# Patient Record
Sex: Female | Born: 1945 | Race: Black or African American | Hispanic: No | Smoking: Never smoker
Health system: Southern US, Community
[De-identification: ages and names within clinical notes are randomized; demographics above are authoritative.]

## PROBLEM LIST (undated history)

## (undated) DIAGNOSIS — E785 Hyperlipidemia, unspecified: Secondary | ICD-10-CM

## (undated) DIAGNOSIS — M1990 Unspecified osteoarthritis, unspecified site: Secondary | ICD-10-CM

## (undated) DIAGNOSIS — I10 Essential (primary) hypertension: Secondary | ICD-10-CM

## (undated) DIAGNOSIS — D649 Anemia, unspecified: Secondary | ICD-10-CM

## (undated) DIAGNOSIS — M62830 Muscle spasm of back: Secondary | ICD-10-CM

## (undated) DIAGNOSIS — E119 Type 2 diabetes mellitus without complications: Secondary | ICD-10-CM

## (undated) DIAGNOSIS — R569 Unspecified convulsions: Secondary | ICD-10-CM

## (undated) DIAGNOSIS — H9190 Unspecified hearing loss, unspecified ear: Secondary | ICD-10-CM

## (undated) DIAGNOSIS — G4489 Other headache syndrome: Secondary | ICD-10-CM

## (undated) DIAGNOSIS — R42 Dizziness and giddiness: Principal | ICD-10-CM

## (undated) DIAGNOSIS — R51 Headache: Secondary | ICD-10-CM

## (undated) DIAGNOSIS — Z8669 Personal history of other diseases of the nervous system and sense organs: Secondary | ICD-10-CM

## (undated) DIAGNOSIS — K219 Gastro-esophageal reflux disease without esophagitis: Secondary | ICD-10-CM

## (undated) DIAGNOSIS — R252 Cramp and spasm: Secondary | ICD-10-CM

## (undated) DIAGNOSIS — R413 Other amnesia: Secondary | ICD-10-CM

## (undated) HISTORY — DX: Essential (primary) hypertension: I10

## (undated) HISTORY — DX: Personal history of other diseases of the nervous system and sense organs: Z86.69

## (undated) HISTORY — DX: Other amnesia: R41.3

## (undated) HISTORY — PX: BACK SURGERY: SHX140

## (undated) HISTORY — DX: Cramp and spasm: R25.2

## (undated) HISTORY — DX: Dizziness and giddiness: R42

## (undated) HISTORY — DX: Other headache syndrome: G44.89

## (undated) HISTORY — DX: Gastro-esophageal reflux disease without esophagitis: K21.9

## (undated) HISTORY — PX: TUBAL LIGATION: SHX77

## (undated) HISTORY — PX: HAND SURGERY: SHX662

## (undated) HISTORY — DX: Headache: R51

## (undated) HISTORY — DX: Hyperlipidemia, unspecified: E78.5

## (undated) HISTORY — PX: ANKLE SURGERY: SHX546

## (undated) HISTORY — DX: Type 2 diabetes mellitus without complications: E11.9

## (undated) HISTORY — PX: KNEE SURGERY: SHX244

## (undated) HISTORY — DX: Muscle spasm of back: M62.830

## (undated) HISTORY — PX: OTHER SURGICAL HISTORY: SHX169

## (undated) HISTORY — DX: Unspecified osteoarthritis, unspecified site: M19.90

---

## 2007-05-20 ENCOUNTER — Encounter: Admission: RE | Admit: 2007-05-20 | Discharge: 2007-05-20 | Payer: Self-pay | Admitting: Family Medicine

## 2007-11-02 ENCOUNTER — Ambulatory Visit: Payer: Self-pay | Admitting: Cardiology

## 2007-11-03 ENCOUNTER — Emergency Department (HOSPITAL_COMMUNITY): Admission: EM | Admit: 2007-11-03 | Discharge: 2007-11-04 | Payer: Self-pay | Admitting: Emergency Medicine

## 2008-02-05 ENCOUNTER — Encounter (HOSPITAL_COMMUNITY): Admission: RE | Admit: 2008-02-05 | Discharge: 2008-03-06 | Payer: Self-pay | Admitting: Family Medicine

## 2008-06-09 ENCOUNTER — Encounter: Admission: RE | Admit: 2008-06-09 | Discharge: 2008-06-09 | Payer: Self-pay | Admitting: Family Medicine

## 2008-08-05 ENCOUNTER — Encounter: Admission: RE | Admit: 2008-08-05 | Discharge: 2008-08-05 | Payer: Self-pay | Admitting: Otolaryngology

## 2009-08-31 ENCOUNTER — Encounter (HOSPITAL_COMMUNITY): Admission: RE | Admit: 2009-08-31 | Discharge: 2009-09-30 | Payer: Self-pay | Admitting: Family Medicine

## 2009-10-04 ENCOUNTER — Encounter (HOSPITAL_COMMUNITY): Admission: RE | Admit: 2009-10-04 | Discharge: 2009-11-03 | Payer: Self-pay | Admitting: Family Medicine

## 2011-04-05 ENCOUNTER — Other Ambulatory Visit (HOSPITAL_COMMUNITY): Payer: Self-pay | Admitting: *Deleted

## 2011-04-05 DIAGNOSIS — Z139 Encounter for screening, unspecified: Secondary | ICD-10-CM

## 2011-04-13 ENCOUNTER — Ambulatory Visit (HOSPITAL_COMMUNITY)
Admission: RE | Admit: 2011-04-13 | Discharge: 2011-04-13 | Disposition: A | Discharge status: Home / Self Care | Payer: Medicaid Other | Source: Ambulatory Visit | Attending: *Deleted | Admitting: *Deleted

## 2011-04-13 DIAGNOSIS — Z1231 Encounter for screening mammogram for malignant neoplasm of breast: Secondary | ICD-10-CM | POA: Insufficient documentation

## 2011-04-13 DIAGNOSIS — Z139 Encounter for screening, unspecified: Secondary | ICD-10-CM

## 2011-05-04 ENCOUNTER — Other Ambulatory Visit (HOSPITAL_COMMUNITY)
Admission: RE | Admit: 2011-05-04 | Discharge: 2011-05-04 | Disposition: A | Discharge status: Home / Self Care | Payer: Medicaid Other | Source: Ambulatory Visit | Attending: Obstetrics & Gynecology | Admitting: Obstetrics & Gynecology

## 2011-05-04 ENCOUNTER — Other Ambulatory Visit: Payer: Self-pay | Admitting: Obstetrics & Gynecology

## 2011-05-04 DIAGNOSIS — Z01419 Encounter for gynecological examination (general) (routine) without abnormal findings: Secondary | ICD-10-CM | POA: Insufficient documentation

## 2011-06-30 LAB — DIFFERENTIAL
Basophils Absolute: 0
Eosinophils Absolute: 0.1
Eosinophils Relative: 1
Lymphs Abs: 0.7
Monocytes Absolute: 0.9
Neutrophils Relative %: 84 — ABNORMAL HIGH

## 2011-06-30 LAB — URINALYSIS, ROUTINE W REFLEX MICROSCOPIC
Bilirubin Urine: NEGATIVE
Glucose, UA: NEGATIVE
Nitrite: NEGATIVE
Protein, ur: NEGATIVE
Urobilinogen, UA: 1

## 2011-06-30 LAB — COMPREHENSIVE METABOLIC PANEL
ALT: 74 — ABNORMAL HIGH
Alkaline Phosphatase: 62
Calcium: 9.2
Chloride: 95 — ABNORMAL LOW
GFR calc non Af Amer: 54 — ABNORMAL LOW
Potassium: 3.8
Sodium: 131 — ABNORMAL LOW
Total Bilirubin: 0.7
Total Protein: 9 — ABNORMAL HIGH

## 2011-06-30 LAB — URINE MICROSCOPIC-ADD ON

## 2011-06-30 LAB — CBC
Platelets: 269
WBC: 11.1 — ABNORMAL HIGH

## 2012-08-15 ENCOUNTER — Other Ambulatory Visit: Payer: Self-pay | Admitting: Neurology

## 2012-08-15 ENCOUNTER — Ambulatory Visit (HOSPITAL_COMMUNITY)
Admission: RE | Admit: 2012-08-15 | Discharge: 2012-08-15 | Disposition: A | Discharge status: Home / Self Care | Payer: Medicaid Other | Source: Ambulatory Visit | Attending: Neurology | Admitting: Neurology

## 2012-08-15 DIAGNOSIS — M545 Low back pain, unspecified: Secondary | ICD-10-CM | POA: Insufficient documentation

## 2012-11-11 ENCOUNTER — Other Ambulatory Visit: Payer: Self-pay | Admitting: Neurology

## 2012-11-11 DIAGNOSIS — R269 Unspecified abnormalities of gait and mobility: Secondary | ICD-10-CM

## 2012-11-22 ENCOUNTER — Ambulatory Visit (HOSPITAL_COMMUNITY): Payer: Medicaid Other

## 2012-11-26 ENCOUNTER — Ambulatory Visit (HOSPITAL_COMMUNITY)
Admission: RE | Admit: 2012-11-26 | Discharge: 2012-11-26 | Disposition: A | Discharge status: Home / Self Care | Payer: Medicaid Other | Source: Ambulatory Visit | Attending: Neurology | Admitting: Neurology

## 2012-11-26 DIAGNOSIS — R42 Dizziness and giddiness: Secondary | ICD-10-CM | POA: Insufficient documentation

## 2012-11-26 DIAGNOSIS — R51 Headache: Secondary | ICD-10-CM | POA: Insufficient documentation

## 2012-11-26 DIAGNOSIS — R269 Unspecified abnormalities of gait and mobility: Secondary | ICD-10-CM | POA: Insufficient documentation

## 2012-11-26 DIAGNOSIS — R9389 Abnormal findings on diagnostic imaging of other specified body structures: Secondary | ICD-10-CM | POA: Insufficient documentation

## 2013-02-12 ENCOUNTER — Encounter: Payer: Medicaid Other | Admitting: *Deleted

## 2013-03-07 ENCOUNTER — Encounter: Payer: Self-pay | Admitting: Neurology

## 2013-03-07 ENCOUNTER — Ambulatory Visit (INDEPENDENT_AMBULATORY_CARE_PROVIDER_SITE_OTHER): Payer: Medicaid Other | Admitting: Neurology

## 2013-03-07 VITALS — BP 126/68 | HR 68 | Ht 66.75 in | Wt 192.0 lb

## 2013-03-07 DIAGNOSIS — R42 Dizziness and giddiness: Secondary | ICD-10-CM

## 2013-03-07 DIAGNOSIS — R51 Headache: Secondary | ICD-10-CM

## 2013-03-07 HISTORY — DX: Headache: R51

## 2013-03-07 HISTORY — DX: Dizziness and giddiness: R42

## 2013-03-07 MED ORDER — TOPIRAMATE 25 MG PO TABS: 25.0000 mg | ORAL_TABLET | Freq: Every day | ORAL | Status: DC

## 2013-03-07 NOTE — Progress Notes (Signed)
Reason for visit: Vertigo  Beth Meza is a 67 y.o. female  History of present illness:  Beth Meza is a 67 year old right-handed black female with a history of headaches dating back about 10 years. The patient is a poor historian, and it is difficult to get an accurate history of what is going on with her. The patient indicates that 4 years ago, she began having episodes of headache associated with vertigo. The patient indicates that she will get a bifrontal headache and burning sensations in the temporal regions. The patient will get vertigo that occurs with these events. The vertigo may be associated with nausea, no vomiting. The patient indicates that when she is quiet, and rests, the episode will go away in about one half hour, and she may feel wiped out afterwards. The patient denies any focal numbness or weakness with the events. The patient denies loss of vision or double vision. The patient also indicates that when she lies down at night, she will get the same headache and vertigo once again. This is brought on by lying flat. The patient indicates that she has to elevate her head in order to rest well. The patient feels somewhat staggery with the vertigo. The patient has had an MRI the brain that shows very minimal white matter changes. The patient has a history of hypertension and dyslipidemia. The patient comes to this office for an evaluation.  Past Medical History  Diagnosis Date  . Back spasm   . Hand cramps   . Leg cramps   . Dizziness and giddiness 03/07/2013  . Headache(784.0) 03/07/2013  . Gastroesophageal reflux disease   . Degenerative arthritis   . Hypertension   . Dyslipidemia   . History of carpal tunnel syndrome     Bilateral    Past Surgical History  Procedure Laterality Date  . Back surgery    . Colon biopsy    . Knee surgery      Pin, history of fracture  . Ankle surgery      History of ankle fracture  . Hand surgery      Carpal tunnel syndrome,  trigger fingers bilaterally    Family History  Problem Relation Age of Onset  . Heart attack Father     Social history:  reports that she has never smoked. She does not have any smokeless tobacco history on file. She reports that she does not drink alcohol or use illicit drugs.  Medications:  No current outpatient prescriptions on file prior to visit.   No current facility-administered medications on file prior to visit.    Allergies: No Known Allergies  ROS:  Out of a complete 14 system review of symptoms, the patient complains only of the following symptoms, and all other reviewed systems are negative.  Weight gain, fatigue Chest pain, palpitations, swelling in the legs Hearing loss, ringing in the ears, stinging sensations Itching, moles Blurred vision, eye pain Shortness of breath Urinary incontinence, constipation Easy bruising Feeling hot, cold, increased thirst, flushing Joint swelling, leg cramps, achy muscles Memory loss, confusion, headache, numbness, weakness, slurred speech, dizziness Depression, anxiety, decreased energy, change in appetite, sleepiness  Blood pressure 126/68, pulse 68, height 5' 6.75" (1.695 m), weight 192 lb (87.091 kg).  Physical Exam  General: The patient is alert and cooperative at the time of the examination. The patient is minimally obese.  Head: Pupils are equal, round, and reactive to light. Discs are flat bilaterally.  Neck: The neck is supple, no carotid  bruits are noted.  Respiratory: The respiratory examination is clear.  Cardiovascular: The cardiovascular examination reveals a regular rate and rhythm, no obvious murmurs or rubs are noted.  Skin: Extremities are without significant edema.  Neurologic Exam  Mental status:  Cranial nerves: Facial symmetry is present. There is good sensation of the face to pinprick and soft touch bilaterally. The strength of the facial muscles and the muscles to head turning and shoulder  shrug are normal bilaterally. Speech is well enunciated, no aphasia or dysarthria is noted. Extraocular movements are full. Visual fields are full.  Motor: The motor testing reveals 5 over 5 strength of all 4 extremities. Good symmetric motor tone is noted throughout.  Sensory: Sensory testing is intact to pinprick, soft touch, vibration sensation, and position sense on all 4 extremities. No evidence of extinction is noted.  Coordination: Cerebellar testing reveals good finger-nose-finger and heel-to-shin bilaterally.  Gait and station: Gait is slightly wide-based. Tandem gait is slightly unsteady. Romberg is negative. No drift is seen.  Reflexes: Deep tendon reflexes are symmetric and normal bilaterally. Toes are downgoing bilaterally.   Assessment/Plan:  1. History of vertigo and headache  2. Minimally abnormal MRI brain  The patient did have very minimal white matter changes, left greater than right deep white matter. The patient has a history of hypertension, currently on medication. The changes seen on MRI are likely secondary to minimal small vessel disease. The patient gives a history of headache and vertigo, which is atypical for true inner ear disease. The patient will be treated with Topamax, and the meclizine will be stopped. The headache and vertigo could potentially represent migraine. These events are quite chronic, going on for greater than 4 years. The patient will followup in 4 months.  Marlan Palau MD 03/08/2013 7:32 PM  [facility] [address] [place], [place] [address]  Phone [phone] Fax [phone]

## 2013-04-15 ENCOUNTER — Other Ambulatory Visit (HOSPITAL_COMMUNITY): Payer: Self-pay | Admitting: Family Medicine

## 2013-04-15 DIAGNOSIS — Z139 Encounter for screening, unspecified: Secondary | ICD-10-CM

## 2013-04-18 ENCOUNTER — Ambulatory Visit (HOSPITAL_COMMUNITY)
Admission: RE | Admit: 2013-04-18 | Discharge: 2013-04-18 | Disposition: A | Discharge status: Home / Self Care | Payer: Medicare Other | Source: Ambulatory Visit | Attending: Family Medicine | Admitting: Family Medicine

## 2013-04-18 DIAGNOSIS — Z1231 Encounter for screening mammogram for malignant neoplasm of breast: Secondary | ICD-10-CM | POA: Insufficient documentation

## 2013-04-18 DIAGNOSIS — Z139 Encounter for screening, unspecified: Secondary | ICD-10-CM

## 2013-05-15 ENCOUNTER — Emergency Department (HOSPITAL_COMMUNITY): Payer: Medicare Other

## 2013-05-15 ENCOUNTER — Encounter (HOSPITAL_COMMUNITY): Payer: Self-pay

## 2013-05-15 ENCOUNTER — Emergency Department (HOSPITAL_COMMUNITY)
Admission: EM | Admit: 2013-05-15 | Discharge: 2013-05-15 | Disposition: A | Discharge status: Home / Self Care | Payer: Medicare Other | Attending: Emergency Medicine | Admitting: Emergency Medicine

## 2013-05-15 DIAGNOSIS — Z79899 Other long term (current) drug therapy: Secondary | ICD-10-CM | POA: Insufficient documentation

## 2013-05-15 DIAGNOSIS — Y929 Unspecified place or not applicable: Secondary | ICD-10-CM | POA: Insufficient documentation

## 2013-05-15 DIAGNOSIS — Z8719 Personal history of other diseases of the digestive system: Secondary | ICD-10-CM | POA: Insufficient documentation

## 2013-05-15 DIAGNOSIS — S060X0A Concussion without loss of consciousness, initial encounter: Secondary | ICD-10-CM

## 2013-05-15 DIAGNOSIS — R252 Cramp and spasm: Secondary | ICD-10-CM | POA: Insufficient documentation

## 2013-05-15 DIAGNOSIS — M5380 Other specified dorsopathies, site unspecified: Secondary | ICD-10-CM | POA: Insufficient documentation

## 2013-05-15 DIAGNOSIS — S060X9A Concussion with loss of consciousness of unspecified duration, initial encounter: Secondary | ICD-10-CM | POA: Insufficient documentation

## 2013-05-15 DIAGNOSIS — S060XAA Concussion with loss of consciousness status unknown, initial encounter: Secondary | ICD-10-CM | POA: Insufficient documentation

## 2013-05-15 DIAGNOSIS — Z8669 Personal history of other diseases of the nervous system and sense organs: Secondary | ICD-10-CM | POA: Insufficient documentation

## 2013-05-15 DIAGNOSIS — S0190XA Unspecified open wound of unspecified part of head, initial encounter: Secondary | ICD-10-CM | POA: Insufficient documentation

## 2013-05-15 DIAGNOSIS — E785 Hyperlipidemia, unspecified: Secondary | ICD-10-CM | POA: Insufficient documentation

## 2013-05-15 DIAGNOSIS — I10 Essential (primary) hypertension: Secondary | ICD-10-CM | POA: Insufficient documentation

## 2013-05-15 DIAGNOSIS — H538 Other visual disturbances: Secondary | ICD-10-CM | POA: Insufficient documentation

## 2013-05-15 DIAGNOSIS — W208XXA Other cause of strike by thrown, projected or falling object, initial encounter: Secondary | ICD-10-CM | POA: Insufficient documentation

## 2013-05-15 DIAGNOSIS — M1990 Unspecified osteoarthritis, unspecified site: Secondary | ICD-10-CM | POA: Insufficient documentation

## 2013-05-15 DIAGNOSIS — Y9389 Activity, other specified: Secondary | ICD-10-CM | POA: Insufficient documentation

## 2013-05-15 MED ORDER — IBUPROFEN 800 MG PO TABS
800.0000 mg | ORAL_TABLET | Freq: Once | ORAL | Status: AC
  Administered 2013-05-15: 800 mg via ORAL

## 2013-05-15 MED ORDER — IBUPROFEN 800 MG PO TABS
ORAL_TABLET | ORAL | Status: AC
  Administered 2013-05-15: 800 mg via ORAL
  Filled 2013-05-15: qty 1

## 2013-05-15 NOTE — ED Notes (Signed)
Pt states a vase fell on the top of her head. Denies LOC. Has small puncture to top of head

## 2013-05-15 NOTE — ED Provider Notes (Signed)
CSN: [id number]     Arrival date & time 05/15/13  1243 History     First MD Initiated Contact with Patient 05/15/13 1257     Chief Complaint  Patient presents with  . Head Injury   (Consider location/radiation/quality/duration/timing/severity/associated sxs/prior Treatment) HPI Comments: Patient was cleaning off a shelf when a vase fell and landed on the top of her head.  She denies loc, but reports head pain since that time and feels as if her vision is blurry.  She received a small laceration to the top of her head.  Patient is a 67 y.o. female presenting with head injury. The history is provided by the patient.  Head Injury Head/neck injury location: top of head. Time since incident:  3 hours Mechanism of injury: direct blow   Pain details:    Quality:  Pressure   Severity:  Moderate   Timing:  Constant   Progression:  Unchanged Chronicity:  New Relieved by:  Nothing Worsened by:  Nothing tried Ineffective treatments:  None tried   Past Medical History  Diagnosis Date  . Back spasm   . Hand cramps   . Leg cramps   . Dizziness and giddiness 03/07/2013  . Headache(784.0) 03/07/2013  . Gastroesophageal reflux disease   . Degenerative arthritis   . Hypertension   . Dyslipidemia   . History of carpal tunnel syndrome     Bilateral   Past Surgical History  Procedure Laterality Date  . Back surgery    . Colon biopsy    . Knee surgery      Pin, history of fracture  . Ankle surgery      History of ankle fracture  . Hand surgery      Carpal tunnel syndrome, trigger fingers bilaterally   Family History  Problem Relation Age of Onset  . Heart attack Father    History  Substance Use Topics  . Smoking status: Never Smoker   . Smokeless tobacco: Not on file  . Alcohol Use: No   OB History   Grav Para Term Preterm Abortions TAB SAB Ect Mult Living                 Review of Systems  All other systems reviewed and are negative.    Allergies  Review of patient's  allergies indicates no known allergies.  Home Medications   Current Outpatient Rx  Name  Route  Sig  Dispense  Refill  . amLODipine (NORVASC) 5 MG tablet   Oral   Take 5 mg by mouth daily.         . diclofenac sodium (VOLTAREN) 1 % GEL   Topical   Apply 1 application topically daily.         Marland Kitchen docusate sodium (COLACE) 50 MG capsule   Oral   Take by mouth 2 (two) times daily.         . fish oil-omega-3 fatty acids 1000 MG capsule   Oral   Take 2 g by mouth daily.         Marland Kitchen lisinopril-hydrochlorothiazide (PRINZIDE,ZESTORETIC) 20-25 MG per tablet   Oral   Take 1 tablet by mouth daily.         . meclizine (ANTIVERT) 25 MG tablet   Oral   Take 25 mg by mouth 3 (three) times daily as needed.         . Multiple Vitamin (MULTIVITAMIN) tablet   Oral   Take 1 tablet by mouth daily.         Marland Kitchen  naproxen sodium (ANAPROX) 220 MG tablet   Oral   Take 220 mg by mouth 2 (two) times daily with a meal.         . nystatin (MYCOSTATIN) 100000 UNIT/ML suspension   Oral   Take 500,000 Units by mouth 2 (two) times daily as needed.         . pravastatin (PRAVACHOL) 20 MG tablet   Oral   Take 20 mg by mouth daily.         . ranitidine (ZANTAC) 150 MG capsule   Oral   Take 150 mg by mouth 2 (two) times daily.         Marland Kitchen topiramate (TOPAMAX) 25 MG tablet   Oral   Take 1 tablet (25 mg total) by mouth at bedtime.   30 tablet   2    BP 120/70  Pulse 69  Temp(Src) 98.7 F (37.1 C) (Oral)  Resp 18  Ht 5\' 8"  (1.727 m)  Wt 189 lb (85.73 kg)  BMI 28.74 kg/m2  SpO2 100% Physical Exam  Nursing note and vitals reviewed. Constitutional: She is oriented to person, place, and time. She appears well-developed and well-nourished. No distress.  HENT:  Head: Normocephalic and atraumatic.  Mouth/Throat: Oropharynx is clear and moist.  There is a small 1 cm laceration to the top of the head.  There is no active bleeding.    There is no hemotympanum.  Eyes: EOM are  normal. Pupils are equal, round, and reactive to light.  Neck: Normal range of motion. Neck supple.  Cardiovascular: Normal rate and regular rhythm.  Exam reveals no gallop and no friction rub.   No murmur heard. Pulmonary/Chest: Effort normal and breath sounds normal. No respiratory distress. She has no wheezes.  Abdominal: Soft. Bowel sounds are normal. She exhibits no distension. There is no tenderness.  Musculoskeletal: Normal range of motion.  Neurological: She is alert and oriented to person, place, and time. No cranial nerve deficit. She exhibits normal muscle tone. Coordination normal.  Skin: Skin is warm and dry. She is not diaphoretic.    ED Course   Procedures (including critical care time)  Labs Reviewed - No data to display No results found. No diagnosis found.  MDM  The neurologic exam is nonfocal and the CT scan is unremarkable. Her headache was treated with Motrin and she is feeling better. At this point she appears stable for discharge. She is to re turn as needed should her symptoms worsen or change.  Geoffery Lyons, MD 05/15/13 1416

## 2013-06-03 ENCOUNTER — Other Ambulatory Visit: Payer: Self-pay | Admitting: Neurology

## 2013-07-08 ENCOUNTER — Ambulatory Visit (INDEPENDENT_AMBULATORY_CARE_PROVIDER_SITE_OTHER): Payer: Medicare Other | Admitting: Nurse Practitioner

## 2013-07-08 ENCOUNTER — Encounter: Payer: Self-pay | Admitting: Nurse Practitioner

## 2013-07-08 VITALS — BP 120/80 | HR 76 | Ht 67.0 in | Wt 188.0 lb

## 2013-07-08 DIAGNOSIS — R51 Headache: Secondary | ICD-10-CM

## 2013-07-08 DIAGNOSIS — R42 Dizziness and giddiness: Secondary | ICD-10-CM

## 2013-07-08 MED ORDER — TOPIRAMATE 25 MG PO TABS: 25.0000 mg | ORAL_TABLET | Freq: Every day | ORAL | Status: DC

## 2013-07-08 NOTE — Patient Instructions (Addendum)
Continue Topamax at current dose Will renew F/U in 6 to 8 months

## 2013-07-08 NOTE — Progress Notes (Signed)
[facility]  PATIENT: Beth Meza DOB: 1946-01-02   REASON FOR VISIT: Followup for headaches   HISTORY OF PRESENT ILLNESS: Ms Angst, 67 yr old black female returns for follow up. She has a history of headaches dating back about 10 years. She was initially evaluated by Dr. Anne Hahn 03/07/2013 . The patient is a poor historian, and it is difficult to get an accurate history of what is going on with her. The patient indicates that 4 years ago, she began having episodes of headache associated with vertigo. The patient indicates that she will get a bifrontal headache and burning sensations in the temporal regions. The patient will get vertigo that occurs with these events. The vertigo may be associated with nausea, no vomiting. The patient indicates that when she is quiet, and rests, the episode will go away in about one half hour, and she may feel wiped out afterwards. The patient denies any focal numbness or weakness with the events. The patient denies loss of vision or double vision. The patient also indicates that when she lies down at night, she will get the same headache and vertigo once again. This is brought on by lying flat. The patient indicates that she has to elevate her head in order to rest well. The patient feels somewhat staggery with the vertigo. The patient has had an MRI the brain that shows very minimal white matter changes. The patient has a history of hypertension and dyslipidemia. She was placed on Topamax low-dose at her last visit and reports that her headaches are doing much better. She is also pleased that she has lost some weight. She had injection in the lumbar area by Dr. Gerilyn Pilgrim 2 weeks ago for her history of chronic back pain.     REVIEW OF SYSTEMS: Full 14 system review of systems performed and notable only for:  Constitutional: N/A  Cardiovascular: N/A  Ear/Nose/Throat: N/A  Skin: Easy bruising  Eyes: N/A  Respiratory: Shortness of breath    Gastroitestinal: N/A  Hematology/Lymphatic: N/A  Endocrine: N/A Musculoskeletal: Back pain  Allergy/Immunology: N/A  Neurological: N/A Psychiatric: Depression and decreased energy  ALLERGIES: No Known Allergies  HOME MEDICATIONS: Outpatient Prescriptions Prior to Visit  Medication Sig Dispense Refill  . amLODipine (NORVASC) 5 MG tablet Take 5 mg by mouth daily.      . diclofenac sodium (VOLTAREN) 1 % GEL Apply 1 application topically daily as needed (Pain).       Marland Kitchen diphenhydrAMINE (BENADRYL) 25 MG tablet Take 25 mg by mouth every 6 (six) hours as needed for allergies.      Marland Kitchen docusate sodium (COLACE) 100 MG capsule Take 100-200 mg by mouth 2 (two) times daily.      . ferrous sulfate 325 (65 FE) MG tablet Take 325 mg by mouth daily with breakfast.      . fish oil-omega-3 fatty acids 1000 MG capsule Take 1 g by mouth 2 (two) times daily.       Marland Kitchen ibuprofen (ADVIL,MOTRIN) 200 MG tablet Take 200 mg by mouth every 6 (six) hours as needed for pain.      Marland Kitchen lisinopril-hydrochlorothiazide (PRINZIDE,ZESTORETIC) 20-25 MG per tablet Take 1 tablet by mouth daily.      . Multiple Vitamin (MULTIVITAMIN) tablet Take 1 tablet by mouth daily.      Marland Kitchen nystatin (MYCOSTATIN) 100000 UNIT/ML suspension Take 500,000 Units by mouth 2 (two) times daily as needed.      . pravastatin (PRAVACHOL) 20 MG tablet Take 20 mg  by mouth daily.      . predniSONE (DELTASONE) 10 MG tablet Take 10 mg by mouth as directed. Takes 10 mg twice daily for 7 days, then takes 10 mg daily for 7 days .      . ranitidine (ZANTAC) 150 MG capsule Take 150 mg by mouth 2 (two) times daily.      . sodium chloride (OCEAN) 0.65 % nasal spray Place 1 spray into the nose as needed for congestion.      . topiramate (TOPAMAX) 25 MG tablet TAKE 1 TABLET (25 MG TOTAL) BY MOUTH AT BEDTIME.  30 tablet  2  . vitamin C (ASCORBIC ACID) 500 MG tablet Take 500 mg by mouth daily.       No facility-administered medications prior to visit.    PAST MEDICAL  HISTORY: Past Medical History  Diagnosis Date  . Back spasm   . Hand cramps   . Leg cramps   . Dizziness and giddiness 03/07/2013  . Headache(784.0) 03/07/2013  . Gastroesophageal reflux disease   . Degenerative arthritis   . Hypertension   . Dyslipidemia   . History of carpal tunnel syndrome     Bilateral    PAST SURGICAL HISTORY: Past Surgical History  Procedure Laterality Date  . Back surgery    . Colon biopsy    . Knee surgery      Pin, history of fracture  . Ankle surgery      History of ankle fracture  . Hand surgery      Carpal tunnel syndrome, trigger fingers bilaterally    FAMILY HISTORY: Family History  Problem Relation Age of Onset  . Heart attack Father     SOCIAL HISTORY: History   Social History  . Marital Status: Divorced    Spouse Name: N/A    Number of Children: 3  . Years of Education: 14   Occupational History  . Not on file.   Social History Main Topics  . Smoking status: Never Smoker   . Smokeless tobacco: Never Used  . Alcohol Use: No  . Drug Use: No  . Sexual Activity: Not on file   Other Topics Concern  . Not on file   Social History Narrative  . No narrative on file     PHYSICAL EXAM  Filed Vitals:   07/08/13 1007 07/08/13 1009  BP: 106/62 99/68  Pulse: 65 76  Height: 5\' 7"  (1.702 m)   Weight: 188 lb (85.276 kg)    Body mass index is 29.44 kg/(m^2).  Generalized: Well developed, mildly obese in no acute distress  Musculoskeletal: No deformity   Neurological examination   Mentation: Alert oriented to time, place, history taking. Follows all commands speech and language fluent  Cranial nerve II-XII: .Pupils were equal round reactive to light extraocular movements were full, visual field were full on confrontational test. Facial sensation and strength were normal. hearing was intact to finger rubbing bilaterally. Uvula tongue midline. head turning and shoulder shrug and were normal and symmetric.Tongue protrusion into  cheek strength was normal. Motor: normal bulk and tone, full strength in the BUE, BLE, fine finger movements normal, no pronator drift. No focal weakness Coordination: finger-nose-finger, heel-to-shin bilaterally, no dysmetria Reflexes: Brachioradialis 2/2, biceps 2/2, triceps 2/2, patellar 2/2, Achilles 2/2, plantar responses were flexor bilaterally. Gait and Station: Rising up from seated position without assistance, wide based stance,  moderate stride, good arm swing, smooth turning, able to perform tiptoe, and heel walking without difficulty. Tandem gait is steady  DIAGNOSTIC DATA (LABS, IMAGING, TESTING) None to review    ASSESSMENT AND PLAN  67 y.o. year old female  has a past medical history of headaches and  Dizziness here for follow up visit.  Headaches much improved on Topamax.   Continue Topamax at current dose Will renew F/U in 6 to 8 months  Nilda Riggs, [facility], [facility], APRN  [facility] [address], [address] [place], [place] [address] [phone]

## 2013-07-08 NOTE — Progress Notes (Signed)
I have read the note, and I agree with the clinical assessment and plan.  , KEITH   

## 2013-08-25 ENCOUNTER — Telehealth: Payer: Self-pay | Admitting: Neurology

## 2013-08-25 MED ORDER — PREDNISONE 5 MG PO TABS: ORAL_TABLET | ORAL | Status: DC

## 2013-08-25 NOTE — Telephone Encounter (Signed)
I called patient. The patient has had dizziness for 2 days associated with some soreness at the top of the head, going down into the neck. The patient is felt potentially have migraine. I will call in some prednisone, and the patient will be seen in 2 days for now.

## 2013-08-25 NOTE — Telephone Encounter (Signed)
Patient said that vertigo may be coming back, started on Friday, sore head all over, could hardly turn, some better today but wanting to f/u with Dr Anne Hahn. Patient sched

## 2013-08-27 ENCOUNTER — Ambulatory Visit (INDEPENDENT_AMBULATORY_CARE_PROVIDER_SITE_OTHER): Payer: Medicare Other | Admitting: Neurology

## 2013-08-27 ENCOUNTER — Encounter: Payer: Self-pay | Admitting: Neurology

## 2013-08-27 VITALS — BP 148/88 | HR 89 | Wt 189.0 lb

## 2013-08-27 DIAGNOSIS — R42 Dizziness and giddiness: Secondary | ICD-10-CM

## 2013-08-27 DIAGNOSIS — R51 Headache: Secondary | ICD-10-CM

## 2013-08-27 NOTE — Patient Instructions (Signed)

## 2013-08-27 NOTE — Progress Notes (Signed)
Reason for visit: Headache  Beth Meza is an 67 y.o. female  History of present illness:  Beth Meza is a 67 year old right-handed  black female with a history of what is felt to be migraine headache. The patient has episodes of dizziness and vertigo with the headache. The patient was placed on low-dose Topamax, and she has done very well with this taking 25 mg at night. The patient however, began having a recurrence of symptoms around 08/23/2013. The patient has had onset of dizziness, vertigo, scalp tenderness on the left occipital area, with discomfort going down into the shoulders bilaterally, with some discomfort down both arms, and some pressure sensation on the upper chest on the left. The patient indicates that she is doing much better at this point when she was placed on a prednisone Dosepak. The patient still has some soreness of the scalp, and some neck discomfort, but the vertigo has improved, and the headache is much better. The patient returns to the office today for an evaluation.  Past Medical History  Diagnosis Date  . Back spasm   . Hand cramps   . Leg cramps   . Dizziness and giddiness 03/07/2013  . Headache(784.0) 03/07/2013  . Gastroesophageal reflux disease   . Degenerative arthritis   . Hypertension   . Dyslipidemia   . History of carpal tunnel syndrome     Bilateral    Past Surgical History  Procedure Laterality Date  . Back surgery    . Colon biopsy    . Knee surgery      Pin, history of fracture  . Ankle surgery      History of ankle fracture  . Hand surgery      Carpal tunnel syndrome, trigger fingers bilaterally    Family History  Problem Relation Age of Onset  . Heart attack Father     Social history:  reports that she has never smoked. She has never used smokeless tobacco. She reports that she does not drink alcohol or use illicit drugs.   No Known Allergies  Medications:  Current Outpatient Prescriptions on File Prior to Visit    Medication Sig Dispense Refill  . amLODipine (NORVASC) 5 MG tablet Take 5 mg by mouth daily.      . diclofenac sodium (VOLTAREN) 1 % GEL Apply 1 application topically daily as needed (Pain).       Marland Kitchen diphenhydrAMINE (BENADRYL) 25 MG tablet Take 25 mg by mouth every 6 (six) hours as needed for allergies.      . ferrous sulfate 325 (65 FE) MG tablet Take 325 mg by mouth daily with breakfast.      . fish oil-omega-3 fatty acids 1000 MG capsule Take 1 g by mouth 2 (two) times daily.       Marland Kitchen ibuprofen (ADVIL,MOTRIN) 200 MG tablet Take 200 mg by mouth every 6 (six) hours as needed for pain.      Marland Kitchen lisinopril-hydrochlorothiazide (PRINZIDE,ZESTORETIC) 20-25 MG per tablet Take 1 tablet by mouth daily.      . Multiple Vitamin (MULTIVITAMIN) tablet Take 1 tablet by mouth daily.      Marland Kitchen nystatin (MYCOSTATIN) 100000 UNIT/ML suspension Take 500,000 Units by mouth 2 (two) times daily as needed.      . pravastatin (PRAVACHOL) 20 MG tablet Take 20 mg by mouth daily.      . predniSONE (DELTASONE) 5 MG tablet Began taking 6 tablets daily, taper by one tablet daily until off the medication.  21 tablet  0  . ranitidine (ZANTAC) 150 MG capsule Take 150 mg by mouth 2 (two) times daily.      . sodium chloride (OCEAN) 0.65 % nasal spray Place 1 spray into the nose as needed for congestion.      . topiramate (TOPAMAX) 25 MG tablet Take 1 tablet (25 mg total) by mouth daily.  30 tablet  6  . vitamin C (ASCORBIC ACID) 500 MG tablet Take 500 mg by mouth daily.       No current facility-administered medications on file prior to visit.    ROS:  Out of a complete 14 system review of symptoms, the patient complains only of the following symptoms, and all other reviewed systems are negative.  Fatigue Hearing loss, ringing in the ears, dizziness Blurred vision, eye pain Shortness of breath Easy bruising Joint pain, joint swelling, aching muscles Memory loss, headache, numbness, weakness, slurred speech,  dizziness Decreased energy  Blood pressure 148/88, pulse 89, weight 189 lb (85.73 kg).  Physical Exam  General: The patient is alert and cooperative at the time of the examination.  Neuromuscular: Range of movement of the cervical spine is full and normal.  Skin: No significant peripheral edema is noted.   Neurologic Exam  Mental status: The patient is oriented x 3.  Cranial nerves: Facial symmetry is present. Speech is normal, no aphasia or dysarthria is noted. Extraocular movements are full. Visual fields are full. Pupils are equal, round, and reactive to light. Discs are flat bilaterally.  Motor: The patient has good strength in all 4 extremities.  Sensory examination: Soft touch sensation is symmetric on the face, arms, and legs.  Coordination: The patient has good finger-nose-finger and heel-to-shin bilaterally.  Gait and station: The patient has a normal gait, but the patient uses a cane for ambulation. Tandem gait is normal. Romberg is negative. No drift is seen.  Reflexes: Deep tendon reflexes are symmetric.   Assessment/Plan:  One. Migraine headache associated with vertigo  The patient had been doing quite well on the Topamax in very low dose taking 25 mg at night. The patient has had a recurrence of her symptoms with the headache, and the vertigo that has responded to a prednisone Dosepak. The patient will continue the prednisone Dosepak, but if the symptoms recur, she is to contact our office. We may need to go up on the Topamax dose in the future. The patient otherwise will followup through this office in 6 months. MRI evaluation done in February 2014 showed chronic small vessel disease.  Marlan Palau MD 08/27/2013 8:57 PM  [facility] [address] [place], [place] [address]  Phone [phone] Fax [phone]

## 2013-09-01 ENCOUNTER — Telehealth: Payer: Self-pay | Admitting: Neurology

## 2013-09-01 MED ORDER — TOPIRAMATE 25 MG PO TABS: 50.0000 mg | ORAL_TABLET | Freq: Every day | ORAL | Status: DC

## 2013-09-01 NOTE — Telephone Encounter (Signed)
Please advise 

## 2013-09-01 NOTE — Telephone Encounter (Signed)
I called patient. The patient indicates that her headaches are coming back at this point. She is on Topamax taking 25 mg at night, will go to 50 mg at night. A prescription was called in.

## 2013-12-31 ENCOUNTER — Ambulatory Visit (HOSPITAL_COMMUNITY)
Admission: RE | Admit: 2013-12-31 | Discharge: 2013-12-31 | Disposition: A | Discharge status: Home / Self Care | Payer: Medicare Other | Source: Ambulatory Visit | Attending: Family Medicine | Admitting: Family Medicine

## 2013-12-31 ENCOUNTER — Other Ambulatory Visit (HOSPITAL_COMMUNITY): Payer: Self-pay | Admitting: Family Medicine

## 2013-12-31 DIAGNOSIS — M259 Joint disorder, unspecified: Secondary | ICD-10-CM | POA: Insufficient documentation

## 2013-12-31 DIAGNOSIS — M25512 Pain in left shoulder: Secondary | ICD-10-CM

## 2013-12-31 DIAGNOSIS — M25519 Pain in unspecified shoulder: Secondary | ICD-10-CM | POA: Insufficient documentation

## 2014-01-14 ENCOUNTER — Other Ambulatory Visit (HOSPITAL_COMMUNITY): Payer: Self-pay | Admitting: Family Medicine

## 2014-01-14 DIAGNOSIS — M25512 Pain in left shoulder: Secondary | ICD-10-CM

## 2014-01-20 ENCOUNTER — Ambulatory Visit (HOSPITAL_COMMUNITY)
Admission: RE | Admit: 2014-01-20 | Discharge: 2014-01-20 | Disposition: A | Discharge status: Home / Self Care | Payer: Medicare Other | Source: Ambulatory Visit | Attending: Family Medicine | Admitting: Family Medicine

## 2014-01-20 DIAGNOSIS — M25619 Stiffness of unspecified shoulder, not elsewhere classified: Secondary | ICD-10-CM | POA: Insufficient documentation

## 2014-01-20 DIAGNOSIS — S43499A Other sprain of unspecified shoulder joint, initial encounter: Secondary | ICD-10-CM | POA: Insufficient documentation

## 2014-01-20 DIAGNOSIS — X58XXXA Exposure to other specified factors, initial encounter: Secondary | ICD-10-CM | POA: Insufficient documentation

## 2014-01-20 DIAGNOSIS — M25512 Pain in left shoulder: Secondary | ICD-10-CM

## 2014-01-20 DIAGNOSIS — M259 Joint disorder, unspecified: Secondary | ICD-10-CM | POA: Insufficient documentation

## 2014-01-20 DIAGNOSIS — M25519 Pain in unspecified shoulder: Secondary | ICD-10-CM | POA: Insufficient documentation

## 2014-01-20 DIAGNOSIS — S46819A Strain of other muscles, fascia and tendons at shoulder and upper arm level, unspecified arm, initial encounter: Secondary | ICD-10-CM | POA: Insufficient documentation

## 2014-02-11 ENCOUNTER — Other Ambulatory Visit: Payer: Self-pay

## 2014-02-11 MED ORDER — TOPIRAMATE 25 MG PO TABS: 50.0000 mg | ORAL_TABLET | Freq: Every day | ORAL | Status: DC

## 2014-02-17 ENCOUNTER — Ambulatory Visit: Payer: Medicare Other | Admitting: Nurse Practitioner

## 2014-02-25 ENCOUNTER — Encounter (INDEPENDENT_AMBULATORY_CARE_PROVIDER_SITE_OTHER): Payer: Self-pay

## 2014-02-25 ENCOUNTER — Encounter: Payer: Self-pay | Admitting: Neurology

## 2014-02-25 ENCOUNTER — Ambulatory Visit (INDEPENDENT_AMBULATORY_CARE_PROVIDER_SITE_OTHER): Payer: Medicare Other | Admitting: Neurology

## 2014-02-25 VITALS — BP 120/74 | HR 92 | Wt 180.0 lb

## 2014-02-25 DIAGNOSIS — R51 Headache: Secondary | ICD-10-CM

## 2014-02-25 DIAGNOSIS — R42 Dizziness and giddiness: Secondary | ICD-10-CM

## 2014-02-25 DIAGNOSIS — R413 Other amnesia: Secondary | ICD-10-CM

## 2014-02-25 HISTORY — DX: Other amnesia: R41.3

## 2014-02-25 MED ORDER — TOPIRAMATE 25 MG PO TABS: 50.0000 mg | ORAL_TABLET | Freq: Every day | ORAL | Status: DC

## 2014-02-25 NOTE — Progress Notes (Signed)
Reason for visit: Migraine headache  Beth Meza is an 68 y.o. female  History of present illness:  Beth Meza is a 68 year old right-handed black female with a history of migraine headaches. The patient had an exacerbation of her headaches in November 2014, and the Topamax dosing was increased from 25 to 50 mg at night. Since that time, she has done quite well with her headaches, and she indicates that the headaches are quite rare at this point. She does have some chronic low back pain, and she is being evaluated for a left rotator tear cuff tear, and she may require surgery in the future. She has been told she has early diabetes, and she has been placed on a low carbohydrate diet and she has lost weight with exercise and dieting. The patient returns to this office for an evaluation.  Past Medical History  Diagnosis Date  . Back spasm   . Hand cramps   . Leg cramps   . Dizziness and giddiness 03/07/2013  . Headache(784.0) 03/07/2013  . Gastroesophageal reflux disease   . Degenerative arthritis   . Hypertension   . Dyslipidemia   . History of carpal tunnel syndrome     Bilateral  . Diabetes     diet controlled  . Memory deficit 02/25/2014    Past Surgical History  Procedure Laterality Date  . Back surgery    . Colon biopsy    . Knee surgery      Pin, history of fracture  . Ankle surgery      History of ankle fracture  . Hand surgery      Carpal tunnel syndrome, trigger fingers bilaterally    Family History  Problem Relation Age of Onset  . Heart attack Father     Social history:  reports that she has never smoked. She has never used smokeless tobacco. She reports that she does not drink alcohol or use illicit drugs.   No Known Allergies  Medications:  Current Outpatient Prescriptions on File Prior to Visit  Medication Sig Dispense Refill  . amLODipine (NORVASC) 5 MG tablet Take 5 mg by mouth daily.      . diclofenac sodium (VOLTAREN) 1 % GEL Apply 1  application topically daily as needed (Pain).       Marland Kitchen. diphenhydrAMINE (BENADRYL) 25 MG tablet Take 25 mg by mouth every 6 (six) hours as needed for allergies.      . ferrous sulfate 325 (65 FE) MG tablet Take 325 mg by mouth daily with breakfast.      . fish oil-omega-3 fatty acids 1000 MG capsule Take 1 g by mouth 2 (two) times daily.       Marland Kitchen. ibuprofen (ADVIL,MOTRIN) 200 MG tablet Take 200 mg by mouth every 6 (six) hours as needed for pain.      Marland Kitchen. lisinopril-hydrochlorothiazide (PRINZIDE,ZESTORETIC) 20-25 MG per tablet Take 1 tablet by mouth daily.      . Multiple Vitamin (MULTIVITAMIN) tablet Take 1 tablet by mouth daily.      . ranitidine (ZANTAC) 150 MG capsule Take 150 mg by mouth 2 (two) times daily.      . vitamin C (ASCORBIC ACID) 500 MG tablet Take 500 mg by mouth daily.       No current facility-administered medications on file prior to visit.    ROS:  Out of a complete 14 system review of symptoms, the patient complains only of the following symptoms, and all other reviewed systems are negative.  Fatigue Hearing loss, ear pain, ringing in the ears Leg swelling Constipation Restless legs, insomnia Incontinence of the bladder Muscle cramps, neck pain, neck stiffness Skin rash, moles Memory loss, dizziness, headache, numbness  Blood pressure 120/74, pulse 92, weight 180 lb (81.647 kg).  Physical Exam  General: The patient is alert and cooperative at the time of the examination.  Skin: No significant peripheral edema is noted.   Neurologic Exam  Mental status: The Mini-Mental status examination done today shows a total score 26/30.  Cranial nerves: Facial symmetry is present. Speech is normal, no aphasia or dysarthria is noted. Extraocular movements are full. Visual fields are full.  Motor: The patient has good strength in all 4 extremities.  Sensory examination: Soft touch sensation is symmetric on the face, arms, and legs.  Coordination: The patient has good  finger-nose-finger and heel-to-shin bilaterally.  Gait and station: The patient has a normal gait. Tandem gait is normal. Romberg is negative. No drift is seen.  Reflexes: Deep tendon reflexes are symmetric.   Assessment/Plan:  One. Migraine headache  2. Mild memory disturbance  The patient will need to be followed for the memory over time. The patient is having good improvement with the headaches on Topamax. The patient will continue the medication for now, and she will followup in 6 months.  Marlan Palau. Keith  MD 02/25/2014 7:46 PM  [facility] [address] [place], [place] [id number]  Phone [phone] Fax [phone]

## 2014-02-25 NOTE — Patient Instructions (Signed)
Migraine Headache A migraine headache is an intense, throbbing pain on one or both sides of your head. A migraine can last for 30 minutes to several hours. CAUSES  The exact cause of a migraine headache is not always known. However, a migraine may be caused when nerves in the brain become irritated and release chemicals that cause inflammation. This causes pain. Certain things may also trigger migraines, such as:  Alcohol.  Smoking.  Stress.  Menstruation.  Aged cheeses.  Foods or drinks that contain nitrates, glutamate, aspartame, or tyramine.  Lack of sleep.  Chocolate.  Caffeine.  Hunger.  Physical exertion.  Fatigue.  Medicines used to treat chest pain (nitroglycerine), birth control pills, estrogen, and some blood pressure medicines. SIGNS AND SYMPTOMS  Pain on one or both sides of your head.  Pulsating or throbbing pain.  Severe pain that prevents daily activities.  Pain that is aggravated by any physical activity.  Nausea, vomiting, or both.  Dizziness.  Pain with exposure to bright lights, loud noises, or activity.  General sensitivity to bright lights, loud noises, or smells. Before you get a migraine, you may get warning signs that a migraine is coming (aura). An aura may include:  Seeing flashing lights.  Seeing bright spots, halos, or zig-zag lines.  Having tunnel vision or blurred vision.  Having feelings of numbness or tingling.  Having trouble talking.  Having muscle weakness. DIAGNOSIS  A migraine headache is often diagnosed based on:  Symptoms.  Physical exam.  A CT scan or MRI of your head. These imaging tests cannot diagnose migraines, but they can help rule out other causes of headaches. TREATMENT Medicines may be given for pain and nausea. Medicines can also be given to help prevent recurrent migraines.  HOME CARE INSTRUCTIONS  Only take over-the-counter or prescription medicines for pain or discomfort as directed by your  health care provider. The use of long-term narcotics is not recommended.  Lie down in a dark, quiet room when you have a migraine.  Keep a journal to find out what may trigger your migraine headaches. For example, write down:  What you eat and drink.  How much sleep you get.  Any change to your diet or medicines.  Limit alcohol consumption.  Quit smoking if you smoke.  Get 7 9 hours of sleep, or as recommended by your health care provider.  Limit stress.  Keep lights dim if bright lights bother you and make your migraines worse. SEEK IMMEDIATE MEDICAL CARE IF:   Your migraine becomes severe.  You have a fever.  You have a stiff neck.  You have vision loss.  You have muscular weakness or loss of muscle control.  You start losing your balance or have trouble walking.  You feel faint or pass out.  You have severe symptoms that are different from your first symptoms. MAKE SURE YOU:   Understand these instructions.  Will watch your condition.  Will get help right away if you are not doing well or get worse. Document Released: 09/25/2005 Document Revised: 07/16/2013 Document Reviewed: 06/02/2013 [employer] Patient Information 2014 [employer].  

## 2014-02-26 ENCOUNTER — Ambulatory Visit (INDEPENDENT_AMBULATORY_CARE_PROVIDER_SITE_OTHER): Payer: Medicare Other | Admitting: Orthopedic Surgery

## 2014-02-26 VITALS — BP 131/79 | Ht 67.0 in | Wt 177.0 lb

## 2014-02-26 DIAGNOSIS — M75102 Unspecified rotator cuff tear or rupture of left shoulder, not specified as traumatic: Secondary | ICD-10-CM

## 2014-02-26 DIAGNOSIS — S43429A Sprain of unspecified rotator cuff capsule, initial encounter: Secondary | ICD-10-CM

## 2014-02-26 NOTE — Patient Instructions (Addendum)
Surgery Left Shoulder open rotator cuff repair [id number]  You have been scheduled for rotator cuff surgery.  All surgeries carry some risk.  Remember you always have the option of continued nonsurgical treatment. However in this situation the risks vs. the benefits favor surgery as the best treatment option. The risks of the surgery includes the following but is not limited to bleeding, infection, pulmonary embolus, death from anesthesia, nerve injury vascular injury or need for further surgery, continued pain.  Specific to this procedure the following risks and complications are rare but possible Stiffness, pain, weakness, re tear.  I expect 9-12 months to fully recover from this procedure.

## 2014-02-26 NOTE — Progress Notes (Signed)
Patient ID: [name], female   DOB: Oct 30, 1945, 68 y.o.   MRN: [id number]  Chief complaint is left shoulder pain without trauma  Patient complains of pain swelling stiffness giving out left shoulder without any injury. She's had pain for 4 months. She's been on tramadol no relief. She says she can no longer do things for herself and is concerned about the pain. Pain is now become constant.  Review of systems she has a history of recent weight loss night sweats hearing loss and ringing in the ears wearing a hearing aid blood clot swelling of the arms and legs shortness of breath breathing difficulties heartburn change in bowel habits musculoskeletal pains including back pain she wears glasses has some vision issues she has a history of headaches and dizziness memory problems balance problems numbness and tingling she has a history of diabetes other systems were normal  Medical history diabetes hypertension arthritis  Surgeries include surgery on her lumbar spine tubal ligation internal fixation left tibia trigger release x4 and a colon biopsy  Current medications amlodipine 5 mg topiramate 50 mg ranitidine 150 mg pravastatin 40 mg Reck salt 25 mg multivitamin lisinopril 20/12.5 mg combination tablet tramadol 50 mg iron fish oil no allergies. Family history diabetes tuberculosis asthma heart disease hypertension arthritis parents are 66 years old and [id number] years old when they died  General appearance is normal, the patient is alert and oriented x3 with normal mood and affect. BP 131/79  Ht 5\' 7"  (1.702 m)  Wt 177 lb (80.287 kg)  BMI 27.72 kg/m2  She ambulates with a cane related to her left tibial internal fixation.  Lower extremity  Inspection and palpation revealed no major abnormalities.  Range of motion is full without contracture.  Motor exam is normal with grade 5 strength.  The joints are fully reduced without subluxation.  There is no atrophy or tremor and muscle tone is  normal.  All joints are stable.  Left shoulder weakness in her rotator cuff primarily supraspinatus tendon at the tendon strength is normal. Her shoulder is stable. Skin is otherwise normal. She has some tenderness around the paracoronal region and along the anterior deltoid  She also has some cervical spine tenderness and what appears to be hyperextension deformity  Right shoulder full range of motion normal strength no instability skin is normal.  Neurovascular exam remains intact  X-rays show a.c. joint arthritis an MRI shows torn rotator cuff. 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 After review of risks and benefits of each he decided to proceed with surgical intervention for an open rotator cuff repair the left shoulder

## 2014-03-04 ENCOUNTER — Other Ambulatory Visit (HOSPITAL_COMMUNITY): Payer: Self-pay | Admitting: Family Medicine

## 2014-03-04 ENCOUNTER — Other Ambulatory Visit: Payer: Self-pay | Admitting: *Deleted

## 2014-03-04 DIAGNOSIS — N63 Unspecified lump in unspecified breast: Secondary | ICD-10-CM

## 2014-03-11 ENCOUNTER — Encounter (HOSPITAL_COMMUNITY): Payer: Medicare Other

## 2014-03-18 ENCOUNTER — Ambulatory Visit (HOSPITAL_COMMUNITY)
Admission: RE | Admit: 2014-03-18 | Discharge: 2014-03-18 | Disposition: A | Discharge status: Home / Self Care | Payer: Medicare Other | Source: Ambulatory Visit | Attending: Family Medicine | Admitting: Family Medicine

## 2014-03-18 ENCOUNTER — Other Ambulatory Visit (HOSPITAL_COMMUNITY): Payer: Self-pay | Admitting: Family Medicine

## 2014-03-18 DIAGNOSIS — N63 Unspecified lump in unspecified breast: Secondary | ICD-10-CM | POA: Insufficient documentation

## 2014-03-26 ENCOUNTER — Encounter (HOSPITAL_COMMUNITY): Payer: Self-pay | Admitting: Pharmacy Technician

## 2014-03-30 ENCOUNTER — Telehealth: Payer: Self-pay | Admitting: *Deleted

## 2014-03-30 NOTE — Telephone Encounter (Signed)
Pt called stating Dr. Janna Archondiego referred her for a colonoscopy and per pt Dr. Janna Archondiego called last Thursday to schedule and could not get anybody pt said she called Dr. Otilio Saberondiego's office this morning and they gave her our number and told her to call to schedule her colonoscopy. Please advise [phone]

## 2014-03-31 NOTE — Telephone Encounter (Signed)
Pt is scheduled an OV with Gerrit HallsAnna Sams, NP on 04/30/2014 at 2:30 PM.

## 2014-04-02 ENCOUNTER — Encounter (HOSPITAL_COMMUNITY): Payer: Self-pay

## 2014-04-02 ENCOUNTER — Encounter (HOSPITAL_COMMUNITY)
Admission: RE | Admit: 2014-04-02 | Discharge: 2014-04-02 | Disposition: A | Discharge status: Home / Self Care | Payer: Medicare Other | Source: Ambulatory Visit | Attending: Orthopedic Surgery | Admitting: Orthopedic Surgery

## 2014-04-02 ENCOUNTER — Other Ambulatory Visit: Payer: Self-pay

## 2014-04-02 DIAGNOSIS — Z01818 Encounter for other preprocedural examination: Secondary | ICD-10-CM | POA: Insufficient documentation

## 2014-04-02 HISTORY — DX: Unspecified convulsions: R56.9

## 2014-04-02 HISTORY — DX: Anemia, unspecified: D64.9

## 2014-04-02 LAB — BASIC METABOLIC PANEL
BUN: 22 mg/dL (ref 6–23)
CALCIUM: 9.7 mg/dL (ref 8.4–10.5)
CO2: 24 mEq/L (ref 19–32)
CREATININE: 1.58 mg/dL — AB (ref 0.50–1.10)
Chloride: 101 mEq/L (ref 96–112)
GFR, EST AFRICAN AMERICAN: 38 mL/min — AB (ref >90)
GFR, EST NON AFRICAN AMERICAN: 33 mL/min — AB (ref >90)
Glucose, Bld: 87 mg/dL (ref 70–99)
Potassium: 4.3 mEq/L (ref 3.7–5.3)
Sodium: 139 mEq/L (ref 137–147)

## 2014-04-02 LAB — HEMOGLOBIN AND HEMATOCRIT, BLOOD
HEMATOCRIT: 35.9 % — AB (ref 36.0–46.0)
HEMOGLOBIN: 12.4 g/dL (ref 12.0–15.0)

## 2014-04-02 NOTE — Patient Instructions (Signed)
Beth Meza  04/02/2014   Your procedure is scheduled on:  04/08/2014  Report to [facility] at  615  AM.  Call this number if you have problems the morning of surgery: [phone]   Remember:   Do not eat food or drink liquids after midnight.   Take these medicines the morning of surgery with A SIP OF WATER:  Amlodipine, lisinopril, zantac, topamax, tramdol   Do not wear jewelry, make-up or nail polish.  Do not wear lotions, powders, or perfumes.   Do not shave 48 hours prior to surgery. Men may shave face and neck.  Do not bring valuables to the hospital.  [facility] is not responsible for any belongings or valuables.               Contacts, dentures or bridgework may not be worn into surgery.  Leave suitcase in the car. After surgery it may be brought to your room.  For patients admitted to the hospital, discharge time is determined by your treatment team.               Patients discharged the day of surgery will not be allowed to drive home.  Name and phone number of your driver: family  Special Instructions: Shower using CHG 2 nights before surgery and the night before surgery.  If you shower the day of surgery use CHG.  Use special wash - you have one bottle of CHG for all showers.  You should use approximately 1/3 of the bottle for each shower.   Please read over the following fact sheets that you were given: Pain Booklet, Coughing and Deep Breathing, Surgical Site Infection Prevention, Anesthesia Post-op Instructions and Care and Recovery After Surgery Surgery for Rotator Cuff Tear with Rehab Rotator cuff surgery is only recommended for individuals who have experienced persistent disability for greater than 3 months of non-surgical (conservative) treatment. Surgery is not necessary but is recommended for individuals who experience difficulty completing daily activities or athletes who are unable to compete. Rotator cuff tears do not usually heal without surgical  intervention. If left alone small rotator cuff tears usually become larger. Younger athletes who have a rotator cuff tear may be recommended for surgery without attempting conservative rehabilitation. The purpose of surgery is to regain function of the shoulder joint and eliminate pain associated with the injury. In addition to repairing the tendon tear, the surgery often removes a portion of the bony roof of the shoulder (acromion) as well as the chronically thickened and inflamed membrane below the acromion (subacromial bursa). REASONS NOT TO OPERATE   Infection of the shoulder.  Inability to complete a rehabilitation program.  Patients who have other conditions (emotional or psychological) conditions that contribute to their shoulder condition. RISKS AND COMPLICATIONS  Infection.  Re-tear of the rotator cuff tendons or muscles.  Shoulder stiffness and/or weakness.  Inability to compete in athletics.  Acromioclavicular (AC) joint paint.  Risks of surgery: infection, bleeding, nerve damage, or damage to surrounding tissues. TECHNIQUE There are different surgical procedures used to treat rotator cuff tears. The type of procedure depends on the extent of injury as well as the surgeon's preference. All of the surgical techniques for rotator cuff tears have the same goal of repairing the torn tendon, removing part of the acromion, and removing the subacromial bursa. There are two main types of procedures: arthroscopic and open incision. Arthroscopic procedures are usually completed and you go home the same day as  surgery (outpatient). These procedures use multiple small incisions in which tools and a video camera are placed to work on the shoulder. An electric shaver removes the bursa, then a power burr shaves down the portion of the acromion that places pressure on the rotator cuff. Finally the rotator cuff is sewed (sutured) back to the humeral head. Open incision procedures require a larger  incision. The deltoid muscle is detached from the acromion and a ligament in the shoulder (coracoacromial) is cut in order for the surgeon to access the rotator cuff. The subacromial bursa is removed as well as part of the acromion to give the rotator cuff room to move freely. The torn tendon is then sutured to the humeral head. After the rotator cuff is repaired, then the deltoid is reattached and the incision is closed up.  RECOVERY   Post-operative care depends on the surgical technique and the preferences of your therapist.  Keep the wound clean and dry for the first 10 to 14 days after surgery.  Keep your shoulder and arm in the sling provided to you for as long as you have been instructed to.  You will be given pain medications by your caregiver.  Passive (without using muscles) shoulder movements may be begun immediately after surgery.  It is important to follow through with you rehabilitation program in order to have the best possible recovery. RETURN TO SPORTS   The rehabilitation period will depend on the sport and position you play as well as the success of the operation.  The minimum recovery period is 6 months.  You must have regained complete shoulder motion and strength before returning to sports. SEEK IMMEDIATE MEDICAL CARE IF:   Any medications produce adverse side effects.  Any complications from surgery occur:  Pain, numbness, or coldness in the extremity operated upon.  Discoloration of the nail beds (they become blue or gray) of the extremity operated upon.  Signs of infections (fever, pain, inflammation, redness, or persistent bleeding). EXERCISES  RANGE OF MOTION (ROM) AND STRETCHING EXERCISES - Rotator Cuff Tear, Surgery For These exercises may help you restore your elbow mobility once your physician has discontinued your immobilization period. Beginning these before your provider's approval may result in delayed healing. Your symptoms may resolve with or  without further involvement from your physician, physical therapist or athletic trainer. While completing these exercises, remember:   Restoring tissue flexibility helps normal motion to return to the joints. This allows healthier, less painful movement and activity.  An effective stretch should be held for at least 30 seconds. A stretch should never be painful. You should only feel a gentle lengthening or release in the stretch. ROM - Pendulum   Bend at the waist so that your right / left arm falls away from your body. Support yourself with your opposite hand on a solid surface, such as a table or a countertop.  Your right / left arm should be perpendicular to the ground. If it is not perpendicular, you need to lean over farther. Relax the muscles in your right / left arm and shoulder as much as possible.  Gently sway your hips and trunk so they move your right / left arm without any use of your right / left shoulder muscles.  Progress your movements so that your right / left arm moves side to side, then forward and backward, and finally, both clockwise and counterclockwise.  Complete __________ repetitions in each direction. Many people use this exercise to relieve discomfort in their  shoulder as well as to gain range of motion. Repeat __________ times. Complete this exercise __________ times per day. STRETCH - Flexion, Seated   Sit in a firm chair so that your right / left forearm can rest on a table or on a table or countertop. Your right / left elbow should rest below the height of your shoulder so that your shoulder feels supported and not tense or uncomfortable.  Keeping your right / left shoulder relaxed, lean forward at your waist, allowing your right / left hand to slide forward. Bend forward until you feel a moderate stretch in your shoulder, but before you feel an increase in your pain.  Hold __________ seconds. Slowly return to your starting position. Repeat __________ times.  Complete this exercise __________ times per day.  STRETCH - Flexion, Standing   Stand with good posture. With an underhand grip on your right / left and an overhand grip on the opposite hand, grasp a broomstick or cane so that your hands are a little more than shoulder-width apart.  Keeping your right / left elbow straight and shoulder muscles relaxed, push the stick with your opposite hand to raise your right / left arm in front of your body and then overhead. Raise your arm until you feel a stretch in your right / left shoulder, but before you have increased shoulder pain.  Try to avoid shrugging your right / left shoulder as your arm rises by keeping your shoulder blade tucked down and toward your mid-back spine. Hold __________ seconds.  Slowly return to the starting position. Repeat __________ times. Complete this exercise __________ times per day.  STRETCH - Abduction, Supine   Stand with good posture. With an underhand grip on your right / left and an overhand grip on the opposite hand, grasp a broomstick or cane so that your hands are a little more than shoulder-width apart.  Keeping your right / left elbow straight and shoulder muscles relaxed, push the stick with your opposite hand to raise your right / left arm out to the side of your body and then overhead. Raise your arm until you feel a stretch in your right / left shoulder, but before you have increased shoulder pain.  Try to avoid shrugging your right / left shoulder as your arm rises by keeping your shoulder blade tucked down and toward your mid-back spine. Hold __________ seconds.  Slowly return to the starting position. Repeat __________ times. Complete this exercise __________ times per day.  ROM - Flexion, Active-Assisted  Lie on your back. You may bend your knees for comfort.  Grasp a broomstick or cane so your hands are about shoulder-width apart. Your right / left hand should grip the end of the stick/cane so that  your hand is positioned "thumbs-up," as if you were about to shake hands.  Using your healthy arm to lead, raise your right / left arm overhead until you feel a gentle stretch in your shoulder. Hold __________ seconds.  Use the stick/cane to assist in returning your right / left arm to its starting position. Repeat __________ times. Complete this exercise __________ times per day.  STRETCH - External Rotation   Tuck a folded towel or small ball under your right / left upper arm. Grasp a broomstick or cane with an underhand grasp a little more than shoulder width apart. Bend your elbows to 90 degrees.  Stand with good posture or sit in a chair without arms.  Use your strong arm to push  the stick across your body. Do not allow the towel or ball to fall. This will rotate your right / left arm away from your abdomen. Using the stick turn/rotate your hand and forearm away from your body. Hold __________ seconds. Repeat __________ times. Complete this exercise __________ times per day.  STRENGTHENING EXERCISES - Rotator Cuff Tear, Surgery For These exercises may help you begin to restore your elbow strength in the initial stage of your rehabilitation. Your physician will determine when you begin these exercises depending on the severity of your injury and the integrity of your repaired tissues. Beginning these before your provider's approval may result in delayed healing. While completing these exercises, remember:   Muscles can gain both the endurance and the strength needed for everyday activities through controlled exercises.  Complete these exercises as instructed by your physician, physical therapist or athletic trainer. Progress the resistance and repetitions only as guided.  You may experience muscle soreness or fatigue, but the pain or discomfort you are trying to eliminate should never worsen during these exercises. If this pain does worsen, stop and make certain you are following the  directions exactly. If the pain is still present after adjustments, discontinue the exercise until you can discuss the trouble with your clinician. STRENGTH - Shoulder Flexion, Isometric   With good posture and facing a wall, stand or sit about 4-6 inches away.  Keeping your right / left elbow straight, gently press the top of your fist into the wall. Increase the pressure gradually until you are pressing as hard as you can without shrugging your shoulder or increasing any shoulder discomfort.  Hold __________ seconds. Release the tension slowly. Relax your shoulder muscles completely before you do the next repetition. Repeat __________ times. Complete this exercise __________ times per day.  STRENGTH - Shoulder Abductors, Isometric   With good posture, stand or sit about 4-6 inches from a wall with your right / left side facing the wall.  Bend your right / left elbow. Gently press your right / left elbow into the wall. Increase the pressure gradually until you are pressing as hard as you can without shrugging your shoulder or increasing any shoulder discomfort.  Hold __________ seconds.  Release the tension slowly. Relax your shoulder muscles completely before you do the next repetition. Repeat __________ times. Complete this exercise __________ times per day.  STRENGTH - Internal Rotators, Isometric   Keep your right / left elbow at your side and bend it 90 degrees.  Step into a door frame so that the inside of your right / left wrist can press against the door frame without your upper arm leaving your side.  Gently press your right / left wrist into the door frame as if you were trying to draw the palm of your hand to your abdomen. Gradually increase the tension until you are pressing as hard as you can without shrugging your shoulder or increasing any shoulder discomfort.  Hold __________ seconds.  Release the tension slowly. Relax your shoulder muscles completely before you do the  next repetition. Repeat __________ times. Complete this exercise __________ times per day.  STRENGTH - External Rotators, Isometric   Keep your right / left elbow at your side and bend it 90 degrees.  Step into a door frame so that the outside of your right / left wrist can press against the door frame without your upper arm leaving your side.  Gently press your right / left wrist into the door frame as  if you were trying to swing the back of your hand away from your abdomen. Gradually increase the tension until you are pressing as hard as you can without shrugging your shoulder or increasing any shoulder discomfort.  Hold __________ seconds.  Release the tension slowly. Relax your shoulder muscles completely before you do the next repetition. Repeat __________ times. Complete this exercise __________ times per day.  Document Released: 09/25/2005 Document Revised: 12/18/2011 Document Reviewed: 01/07/2009 [facility] Patient Information 2015 [place], [place]. This information is not intended to replace advice given to you by your health care provider. Make sure you discuss any questions you have with your health care provider. PATIENT INSTRUCTIONS POST-ANESTHESIA  IMMEDIATELY FOLLOWING SURGERY:  Do not drive or operate machinery for the first twenty four hours after surgery.  Do not make any important decisions for twenty four hours after surgery or while taking narcotic pain medications or sedatives.  If you develop intractable nausea and vomiting or a severe headache please notify your doctor immediately.  FOLLOW-UP:  Please make an appointment with your surgeon as instructed. You do not need to follow up with anesthesia unless specifically instructed to do so.  WOUND CARE INSTRUCTIONS (if applicable):  Keep a dry clean dressing on the anesthesia/puncture wound site if there is drainage.  Once the wound has quit draining you may leave it open to air.  Generally you should leave the bandage intact  for twenty four hours unless there is drainage.  If the epidural site drains for more than 36-48 hours please call the anesthesia department.  QUESTIONS?:  Please feel free to call your physician or the hospital operator if you have any questions, and they will be happy to assist you.

## 2014-04-02 NOTE — Pre-Procedure Instructions (Signed)
Patient given information to sign up for my chart at home. 

## 2014-04-03 ENCOUNTER — Telehealth: Payer: Self-pay | Admitting: Orthopedic Surgery

## 2014-04-03 NOTE — Telephone Encounter (Signed)
Regarding out-patient surgery [facility], left shoulder, rotator cuff, CPT [phone], no pre-authorization required per primary insurance Medicare guidelines.  Per secondary insurer, [place], no prior authorization required, per [employer] online system.

## 2014-04-06 NOTE — H&P (Signed)
  Chief complaint is left shoulder pain without trauma  Patient complains of pain swelling stiffness giving out left shoulder without any injury. She's had pain for 4 months. She's been on tramadol no relief. She says she can no longer do things for herself and is concerned about the pain. Pain is now become constant.  Review of systems she has a history of recent weight loss night sweats hearing loss and ringing in the ears wearing a hearing aid blood clot swelling of the arms and legs shortness of breath breathing difficulties heartburn change in bowel habits musculoskeletal pains including back pain she wears glasses has some vision issues she has a history of headaches and dizziness memory problems balance problems numbness and tingling she has a history of diabetes other systems were normal  Medical history diabetes hypertension arthritis  Surgeries include surgery on her lumbar spine tubal ligation internal fixation left tibia trigger release x4 and a colon biopsy  Current medications amlodipine 5 mg topiramate 50 mg ranitidine 150 mg pravastatin 40 mg Reck salt 25 mg multivitamin lisinopril 20/12.5 mg combination tablet tramadol 50 mg iron fish oil no allergies. Family history diabetes tuberculosis asthma heart disease hypertension arthritis parents are 66 years old and [id number] years old when they died  General appearance is normal, the patient is alert and oriented x3 with normal mood and affect. BP 131/79  Ht 5\' 7"  (1.702 m)  Wt 177 lb (80.287 kg)  BMI 27.72 kg/m2  She ambulates with a cane related to her left tibial internal fixation.  Lower extremity  Inspection and palpation revealed no major abnormalities.  Range of motion is full without contracture.  Motor exam is normal with grade 5 strength.  The joints are fully reduced without subluxation.  There is no atrophy or tremor and muscle tone is normal.  All joints are stable.  Left shoulder weakness in her rotator cuff  primarily supraspinatus tendon at the tendon strength is normal. Her shoulder is stable. Skin is otherwise normal. She has some tenderness around the paracoronal region and along the anterior deltoid  She also has some cervical spine tenderness and what appears to be hyperextension deformity  Right shoulder full range of motion normal strength no instability skin is normal.  Neurovascular exam remains intact  X-rays show a.c. joint arthritis an MRI shows torn rotator cuff. 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 After review of risks and benefits of each he decided to proceed with surgical intervention for an open rotator cuff repair the left shoulder           IMPRESSION: 1. Full-thickness retracted supraspinatus tendon tear with maximal retraction of 2.5 cm. 2. Moderate infraspinatus tendinopathy and shallow articular surface tears. 3. Intact biceps tendon and glenoid labrum. 4. Type 2-3 acromion and moderate AC joint degenerative changes may contribute to bony impingement.   DX LEFT ROTATOR CUFF TEAR  PLAN OPEN ROTATOR CUFF REPAIR

## 2014-04-08 ENCOUNTER — Encounter (HOSPITAL_COMMUNITY)
Admission: RE | Disposition: A | Discharge status: Home / Self Care | Payer: Self-pay | Source: Ambulatory Visit | Attending: Orthopedic Surgery

## 2014-04-08 ENCOUNTER — Encounter (HOSPITAL_COMMUNITY): Payer: Medicare Other | Admitting: Anesthesiology

## 2014-04-08 ENCOUNTER — Ambulatory Visit (HOSPITAL_COMMUNITY): Payer: Medicare Other | Admitting: Anesthesiology

## 2014-04-08 ENCOUNTER — Ambulatory Visit (HOSPITAL_COMMUNITY)
Admission: RE | Admit: 2014-04-08 | Discharge: 2014-04-08 | Disposition: A | Discharge status: Home / Self Care | Payer: Medicare Other | Source: Ambulatory Visit | Attending: Orthopedic Surgery | Admitting: Orthopedic Surgery

## 2014-04-08 DIAGNOSIS — K219 Gastro-esophageal reflux disease without esophagitis: Secondary | ICD-10-CM | POA: Diagnosis not present

## 2014-04-08 DIAGNOSIS — S43429A Sprain of unspecified rotator cuff capsule, initial encounter: Secondary | ICD-10-CM | POA: Insufficient documentation

## 2014-04-08 DIAGNOSIS — E119 Type 2 diabetes mellitus without complications: Secondary | ICD-10-CM | POA: Diagnosis not present

## 2014-04-08 DIAGNOSIS — Z79899 Other long term (current) drug therapy: Secondary | ICD-10-CM | POA: Diagnosis not present

## 2014-04-08 DIAGNOSIS — M75102 Unspecified rotator cuff tear or rupture of left shoulder, not specified as traumatic: Secondary | ICD-10-CM

## 2014-04-08 DIAGNOSIS — I10 Essential (primary) hypertension: Secondary | ICD-10-CM | POA: Insufficient documentation

## 2014-04-08 HISTORY — PX: SHOULDER OPEN ROTATOR CUFF REPAIR: SHX2407

## 2014-04-08 LAB — GLUCOSE, CAPILLARY
Glucose-Capillary: 88 mg/dL (ref 70–99)
Glucose-Capillary: 134 mg/dL — ABNORMAL HIGH (ref 70–99)

## 2014-04-08 SURGERY — REPAIR, ROTATOR CUFF, OPEN
Anesthesia: General | Site: Shoulder | Laterality: Left

## 2014-04-08 MED ORDER — ONDANSETRON HCL 4 MG/2ML IJ SOLN: 4.0000 mg | Freq: Once | INTRAMUSCULAR | Status: DC | PRN

## 2014-04-08 MED ORDER — SODIUM CHLORIDE 0.9 % IJ SOLN
INTRAMUSCULAR | Status: AC
  Filled 2014-04-08: qty 10

## 2014-04-08 MED ORDER — PROPOFOL 10 MG/ML IV BOLUS
INTRAVENOUS | Status: AC
  Filled 2014-04-08: qty 20

## 2014-04-08 MED ORDER — SODIUM CHLORIDE 0.9 % IR SOLN
Status: DC | PRN
  Administered 2014-04-08: 1000 mL

## 2014-04-08 MED ORDER — ARTIFICIAL TEARS OP OINT
TOPICAL_OINTMENT | OPHTHALMIC | Status: DC | PRN
  Administered 2014-04-08: 1 via OPHTHALMIC

## 2014-04-08 MED ORDER — HYDROCODONE-ACETAMINOPHEN 10-325 MG PO TABS: 1.0000 | ORAL_TABLET | ORAL | Status: DC | PRN

## 2014-04-08 MED ORDER — ONDANSETRON HCL 4 MG/2ML IJ SOLN
4.0000 mg | Freq: Once | INTRAMUSCULAR | Status: AC
  Administered 2014-04-08: 4 mg via INTRAVENOUS
  Filled 2014-04-08: qty 2

## 2014-04-08 MED ORDER — ONDANSETRON HCL 4 MG/2ML IJ SOLN
INTRAMUSCULAR | Status: AC
  Filled 2014-04-08: qty 2

## 2014-04-08 MED ORDER — GLYCOPYRROLATE 0.2 MG/ML IJ SOLN
INTRAMUSCULAR | Status: DC | PRN
  Administered 2014-04-08: 0.6 mg via INTRAVENOUS

## 2014-04-08 MED ORDER — MIDAZOLAM HCL 2 MG/2ML IJ SOLN
1.0000 mg | INTRAMUSCULAR | Status: DC | PRN
Start: 2014-04-08 — End: 2014-04-08
  Administered 2014-04-08 (×2): 1 mg via INTRAVENOUS

## 2014-04-08 MED ORDER — CEFAZOLIN SODIUM-DEXTROSE 2-3 GM-% IV SOLR
2.0000 g | INTRAVENOUS | Status: AC
  Administered 2014-04-08: 2 g via INTRAVENOUS
  Filled 2014-04-08: qty 50

## 2014-04-08 MED ORDER — BUPIVACAINE-EPINEPHRINE (PF) 0.5% -1:200000 IJ SOLN
INTRAMUSCULAR | Status: DC | PRN
Start: 2014-04-08 — End: 2014-04-08
  Administered 2014-04-08: 60 mL

## 2014-04-08 MED ORDER — CHLORHEXIDINE GLUCONATE 4 % EX LIQD: 60.0000 mL | Freq: Once | CUTANEOUS | Status: DC

## 2014-04-08 MED ORDER — ARTIFICIAL TEARS OP OINT
TOPICAL_OINTMENT | OPHTHALMIC | Status: AC
  Filled 2014-04-08: qty 3.5

## 2014-04-08 MED ORDER — PROPOFOL 10 MG/ML IV BOLUS
INTRAVENOUS | Status: DC | PRN
  Administered 2014-04-08: 100 mg via INTRAVENOUS

## 2014-04-08 MED ORDER — NEOSTIGMINE METHYLSULFATE 10 MG/10ML IV SOLN
INTRAVENOUS | Status: AC
  Filled 2014-04-08: qty 1

## 2014-04-08 MED ORDER — SUCCINYLCHOLINE CHLORIDE 20 MG/ML IJ SOLN
INTRAMUSCULAR | Status: DC | PRN
  Administered 2014-04-08: 100 mg via INTRAVENOUS

## 2014-04-08 MED ORDER — LACTATED RINGERS IV SOLN
INTRAVENOUS | Status: DC
  Administered 2014-04-08: 1000 mL via INTRAVENOUS

## 2014-04-08 MED ORDER — FENTANYL CITRATE 0.05 MG/ML IJ SOLN
INTRAMUSCULAR | Status: DC | PRN
  Administered 2014-04-08 (×5): 50 ug via INTRAVENOUS

## 2014-04-08 MED ORDER — ONDANSETRON HCL 4 MG/2ML IJ SOLN
4.0000 mg | Freq: Once | INTRAMUSCULAR | Status: AC
  Administered 2014-04-08: 4 mg via INTRAVENOUS

## 2014-04-08 MED ORDER — ROCURONIUM BROMIDE 100 MG/10ML IV SOLN
INTRAVENOUS | Status: DC | PRN
  Administered 2014-04-08: 20 mg via INTRAVENOUS
  Administered 2014-04-08: 10 mg via INTRAVENOUS

## 2014-04-08 MED ORDER — FENTANYL CITRATE 0.05 MG/ML IJ SOLN: 25.0000 ug | INTRAMUSCULAR | Status: DC | PRN

## 2014-04-08 MED ORDER — FENTANYL CITRATE 0.05 MG/ML IJ SOLN
INTRAMUSCULAR | Status: AC
  Filled 2014-04-08: qty 5

## 2014-04-08 MED ORDER — IBUPROFEN 800 MG PO TABS: 800.0000 mg | ORAL_TABLET | Freq: Three times a day (TID) | ORAL | Status: DC | PRN

## 2014-04-08 MED ORDER — MIDAZOLAM HCL 2 MG/2ML IJ SOLN
INTRAMUSCULAR | Status: AC
  Filled 2014-04-08: qty 2

## 2014-04-08 MED ORDER — KETOROLAC TROMETHAMINE 30 MG/ML IJ SOLN
30.0000 mg | Freq: Once | INTRAMUSCULAR | Status: AC
  Administered 2014-04-08: 30 mg via INTRAVENOUS
  Filled 2014-04-08: qty 1

## 2014-04-08 MED ORDER — GLYCOPYRROLATE 0.2 MG/ML IJ SOLN
INTRAMUSCULAR | Status: AC
  Filled 2014-04-08: qty 3

## 2014-04-08 MED ORDER — HYDROCODONE-ACETAMINOPHEN 5-325 MG PO TABS
2.0000 | ORAL_TABLET | Freq: Once | ORAL | Status: AC
  Administered 2014-04-08: 2 via ORAL
  Filled 2014-04-08: qty 2

## 2014-04-08 MED ORDER — EPHEDRINE SULFATE 50 MG/ML IJ SOLN
INTRAMUSCULAR | Status: AC
  Filled 2014-04-08: qty 1

## 2014-04-08 MED ORDER — LIDOCAINE HCL (PF) 1 % IJ SOLN
INTRAMUSCULAR | Status: AC
  Filled 2014-04-08: qty 5

## 2014-04-08 MED ORDER — BUPIVACAINE-EPINEPHRINE (PF) 0.5% -1:200000 IJ SOLN
INTRAMUSCULAR | Status: AC
  Filled 2014-04-08: qty 60

## 2014-04-08 MED ORDER — METHOCARBAMOL 1000 MG/10ML IJ SOLN
500.0000 mg | Freq: Once | INTRAVENOUS | Status: AC
  Administered 2014-04-08: 500 mg via INTRAVENOUS
  Filled 2014-04-08: qty 5

## 2014-04-08 MED ORDER — LIDOCAINE HCL (CARDIAC) 20 MG/ML IV SOLN
INTRAVENOUS | Status: DC | PRN
  Administered 2014-04-08: 25 mg via INTRAVENOUS

## 2014-04-08 MED ORDER — NEOSTIGMINE METHYLSULFATE 10 MG/10ML IV SOLN
INTRAVENOUS | Status: DC | PRN
  Administered 2014-04-08: 4 mg via INTRAVENOUS

## 2014-04-08 MED ORDER — CYCLOBENZAPRINE HCL 5 MG PO TABS: 5.0000 mg | ORAL_TABLET | Freq: Three times a day (TID) | ORAL | Status: DC | PRN

## 2014-04-08 MED ORDER — ROCURONIUM BROMIDE 50 MG/5ML IV SOLN
INTRAVENOUS | Status: AC
  Filled 2014-04-08: qty 1

## 2014-04-08 MED ORDER — LACTATED RINGERS IV SOLN
INTRAVENOUS | Status: DC | PRN
  Administered 2014-04-08 (×2): via INTRAVENOUS

## 2014-04-08 MED ORDER — SUCCINYLCHOLINE CHLORIDE 20 MG/ML IJ SOLN
INTRAMUSCULAR | Status: AC
  Filled 2014-04-08: qty 1

## 2014-04-08 SURGICAL SUPPLY — 61 items
ANCHOR CORKSCREW 5.0 FIBERWIRE (Anchor) ×3 IMPLANT
BIT DRILL 2.0MX128MM (BIT) ×3 IMPLANT
BLADE HEX COATED 2.75 (ELECTRODE) ×3 IMPLANT
BLADE OSC/SAGITTAL MD 9X18.5 (BLADE) IMPLANT
BLADE SURG 15 STRL LF DISP TIS (BLADE) ×1 IMPLANT
BLADE SURG 15 STRL SS (BLADE) ×2
BLADE SURG SZ10 CARB STEEL (BLADE) ×3 IMPLANT
BUR FAST CUTTING (BURR)
BUR ROUND 5.0 (BURR) ×3 IMPLANT
BUR SRG 54X4.7X12 FLUT (BURR) IMPLANT
BURR SRG 54X4.7X12 FLUT (BURR)
CHLORAPREP W/TINT 26ML (MISCELLANEOUS) ×3 IMPLANT
CLOTH BEACON ORANGE TIMEOUT ST (SAFETY) ×3 IMPLANT
CONNECTOR PERFECT PASSER (CONNECTOR) ×3 IMPLANT
COVER LIGHT HANDLE STERIS (MISCELLANEOUS) ×6 IMPLANT
COVER MAYO STAND XLG (DRAPE) ×3 IMPLANT
COVER PROBE W GEL 5X96 (DRAPES) IMPLANT
DRAPE PROXIMA HALF (DRAPES) ×3 IMPLANT
DRAPE UTILITY XL STRL (DRAPES) ×6 IMPLANT
DRESSING ALLEVYN BORDER 5X5 (GAUZE/BANDAGES/DRESSINGS) ×3 IMPLANT
ELECT REM PT RETURN 9FT ADLT (ELECTROSURGICAL) ×3
ELECTRODE REM PT RTRN 9FT ADLT (ELECTROSURGICAL) ×1 IMPLANT
GLOVE BIOGEL M 6.5 STRL (GLOVE) ×3 IMPLANT
GLOVE BIOGEL M 7.0 STRL (GLOVE) ×6 IMPLANT
GLOVE BIOGEL PI IND STRL 6.5 (GLOVE) ×1 IMPLANT
GLOVE BIOGEL PI IND STRL 7.0 (GLOVE) ×3 IMPLANT
GLOVE BIOGEL PI INDICATOR 6.5 (GLOVE) ×2
GLOVE BIOGEL PI INDICATOR 7.0 (GLOVE) ×6
GLOVE SKINSENSE NS SZ8.0 LF (GLOVE) ×4
GLOVE SKINSENSE STRL SZ8.0 LF (GLOVE) ×2 IMPLANT
GLOVE SS N UNI LF 8.5 STRL (GLOVE) ×3 IMPLANT
GOWN STRL REUS W/TWL LRG LVL3 (GOWN DISPOSABLE) ×9 IMPLANT
GOWN STRL REUS W/TWL XL LVL3 (GOWN DISPOSABLE) ×3 IMPLANT
IMMOBILIZER SHOULDER LGE (ORTHOPEDIC SUPPLIES) ×3 IMPLANT
IMMOBILIZER SHOULDER MED (ORTHOPEDIC SUPPLIES) IMPLANT
INST SET MINOR BONE (KITS) ×3 IMPLANT
KIT BLADEGUARD II DBL (SET/KITS/TRAYS/PACK) ×3 IMPLANT
KIT ROOM TURNOVER APOR (KITS) ×3 IMPLANT
MANIFOLD NEPTUNE II (INSTRUMENTS) ×3 IMPLANT
MARKER SKIN DUAL TIP RULER LAB (MISCELLANEOUS) ×3 IMPLANT
NEEDLE HYPO 21X1.5 SAFETY (NEEDLE) ×3 IMPLANT
NEEDLE MA TROC 1/2 (NEEDLE) IMPLANT
NEEDLE MAYO 6 CRC TAPER PT (NEEDLE) ×3 IMPLANT
NS IRRIG 1000ML POUR BTL (IV SOLUTION) ×3 IMPLANT
PACK TOTAL JOINT (CUSTOM PROCEDURE TRAY) ×3 IMPLANT
PAD ARMBOARD 7.5X6 YLW CONV (MISCELLANEOUS) ×6 IMPLANT
PASSER SUT CAPTURE FIRST (SUTURE) IMPLANT
RASP SM TEAR CROSS CUT (RASP) IMPLANT
SET BASIN LINEN APH (SET/KITS/TRAYS/PACK) ×3 IMPLANT
SPONGE LAP 18X18 X RAY DECT (DISPOSABLE) ×3 IMPLANT
STAPLER VISISTAT 35W (STAPLE) ×3 IMPLANT
SUT BONE WAX W31G (SUTURE) IMPLANT
SUT ETHIBOND NAB OS 4 #2 30IN (SUTURE) ×3 IMPLANT
SUT ETHILON 3 0 FSL (SUTURE) IMPLANT
SUT MON AB 0 CT1 (SUTURE) ×3 IMPLANT
SUT MON AB 2-0 CT1 36 (SUTURE) IMPLANT
SUT PROLENE 3 0 PS 1 (SUTURE) IMPLANT
SUT SMART STITCH CARTRIDGE (SUTURE) ×6 IMPLANT
SYR 30ML LL (SYRINGE) ×3 IMPLANT
SYR BULB IRRIGATION 50ML (SYRINGE) ×6 IMPLANT
YANKAUER SUCT 12FT TUBE ARGYLE (SUCTIONS) ×3 IMPLANT

## 2014-04-08 NOTE — Brief Op Note (Signed)
04/08/2014  8:55 AM  PATIENT:  Beth Meza  68 y.o. female  PRE-OPERATIVE DIAGNOSIS:  Left Rotator Cuff Tear  POST-OPERATIVE DIAGNOSIS:  Left Rotator Cuff Tear  PROCEDURE:  Procedure(s): ROTATOR CUFF REPAIR SHOULDER OPEN (Left)  Implants one corkscrew anchor Total of 4 sutures 2 side-to-side sutures and 2 sutures from the corkscrew anchor were used to repair the tendon  SURGEON:  Surgeon(s) and Role:    * Vickki HearingStanley E , MD - Primary  PHYSICIAN ASSISTANT:   ASSISTANTS: [place] Nation[place]ty Ashley   ANESTHESIA:   general  EBL:  Total I/O In: 1000 [I.V.:1000] Out: 10 [Blood:10]  BLOOD ADMINISTERED:none  DRAINS: none   LOCAL MEDICATIONS USED:  MARCAINE     SPECIMEN:  No Specimen  DISPOSITION OF SPECIMEN:  N/A  COUNTS:  YES  TOURNIQUET:  * No tourniquets in log *  DICTATION: .Dragon Dictation  PLAN OF CARE: Discharge to home after PACU  PATIENT DISPOSITION:  PACU - hemodynamically stable.   Delay start of Pharmacological VTE agent (>24hrs) due to surgical blood loss or risk of bleeding: not applicable  Details of procedure the patient was identified in the preoperative area and the site of surgery was confirmed and the chart was reviewed. She was taken to the operating room given appropriate amount of Ancef based on her weight of 171 pounds. After general anesthesia was administered via intubation without complication the patient was placed in modified beachchair position. The left arm and shoulder were prepped and draped sterilely. Timeout was completed.  An anterior incision was made from the anterior portion of the acromion distally 5 cm extended. Subcutaneous tissue was divided. Deltoid fascia was identified and split from the anterior corner of the acromion without detachment. Deep retractors were placed and a bursectomy was performed. The tear was noted to be longitudinal with minimal retraction. 2 sutures were placed in the tendon for traction and the tendon was reduced  and it reduced easily. However to assure that there was no tension on the repair superior and inferior releases were performed.  The greater tuberosity was abraded with a bur and then several drill holes were made to create a postoperative blood clot with stem cells from the marrow. Right he  We then did a side-to-side suture x2 with inverted mattress. We then placed a corkscrew anchor and apply 2 more sutures side to side. This gave an excellent reduction in a watertight closure. The wound was then irrigated and closed with 0 Monocryl for the deltoid 2-0 Monocryl for the subcutaneous and staples to reapproximate the skin.  We injected Marcaine with epinephrine 18 cc to start the case to help control bleeding. And then the balance of 60 cc was injected subacromial and into the soft tissue.  Sterile dressing was applied followed by application of a shoulder immobilizer  This patient will be allowed early passive range of motion as the tear was repaired with excellent approximation and no tension.

## 2014-04-08 NOTE — Transfer of Care (Signed)
Immediate Anesthesia Transfer of Care Note  Patient: Beth Meza  Procedure(s) Performed: Procedure(s): ROTATOR CUFF REPAIR SHOULDER OPEN (Left)  Patient Location: PACU  Anesthesia Type:General  Level of Consciousness: awake, alert , oriented and patient cooperative  Airway & Oxygen Therapy: Patient Spontanous Breathing and Patient connected to face mask oxygen  Post-op Assessment: Report given to PACU RN and Post -op Vital signs reviewed and stable  Post vital signs: Reviewed and stable  Complications: No apparent anesthesia complications

## 2014-04-08 NOTE — Anesthesia Procedure Notes (Signed)
Procedure Name: Intubation Date/Time: 04/08/2014 7:35 AM Performed by: Pernell DupreADAMS, AMY A Pre-anesthesia Checklist: Patient identified, Timeout performed, Emergency Drugs available, Suction available and Patient being monitored Patient Re-evaluated:Patient Re-evaluated prior to inductionOxygen Delivery Method: Circle System Utilized Preoxygenation: Pre-oxygenation with 100% oxygen Intubation Type: IV induction, Rapid sequence and Cricoid Pressure applied Laryngoscope Size: 3 and Miller Grade View: Grade I Tube type: Oral Tube size: 7.0 mm Number of attempts: 1 Airway Equipment and Method: stylet Placement Confirmation: ETT inserted through vocal cords under direct vision,  positive ETCO2 and breath sounds checked- equal and bilateral Secured at: 21 cm Tube secured with: Tape Dental Injury: Teeth and Oropharynx as per pre-operative assessment

## 2014-04-08 NOTE — Interval H&P Note (Signed)
History and Physical Interval Note:  04/08/2014 7:18 AM  Beth Meza  has presented today for surgery, with the diagnosis of Left Rotator Cuff Tear  The various methods of treatment have been discussed with the patient and family. After consideration of risks, benefits and other options for treatment, the patient has consented to  Procedure(s): ROTATOR CUFF REPAIR SHOULDER OPEN (Left) as a surgical intervention .  The patient's history has been reviewed, patient examined, no change in status, stable for surgery.  I have reviewed the patient's chart and labs.  Questions were answered to the patient's satisfaction.     Fuller CanadaStanley 

## 2014-04-08 NOTE — Anesthesia Postprocedure Evaluation (Signed)
  Anesthesia Post-op Note  Patient: Beth Meza  Procedure(s) Performed: Procedure(s): ROTATOR CUFF REPAIR SHOULDER OPEN (Left)  Patient Location: PACU  Anesthesia Type:General  Level of Consciousness: awake, alert , oriented and patient cooperative  Airway and Oxygen Therapy: Patient Spontanous Breathing and Patient connected to face mask oxygen  Post-op Pain: none  Post-op Assessment: Post-op Vital signs reviewed, Patient's Cardiovascular Status Stable, Respiratory Function Stable, Patent Airway, No signs of Nausea or vomiting and Pain level controlled  Post-op Vital Signs: Reviewed and stable  Last Vitals:  Filed Vitals:   04/08/14 0720  BP: 150/70  Temp:   Resp: 48    Complications: No apparent anesthesia complications

## 2014-04-08 NOTE — Op Note (Signed)
04/08/2014  8:55 AM  PATIENT:  Beth Meza  68 y.o. female  PRE-OPERATIVE DIAGNOSIS:  Left Rotator Cuff Tear  POST-OPERATIVE DIAGNOSIS:  Left Rotator Cuff Tear  PROCEDURE:  Procedure(s): ROTATOR CUFF REPAIR SHOULDER OPEN (Left)  Implants one corkscrew anchor Total of 4 sutures 2 side-to-side sutures and 2 sutures from the corkscrew anchor were used to repair the tendon  SURGEON:  Surgeon(s) and Role:    * Vickki HearingStanley E Harrison, MD - Primary  PHYSICIAN ASSISTANT:   ASSISTANTS: [place]   ANESTHESIA:   general  EBL:  Total I/O In: 1000 [I.V.:1000] Out: 10 [Blood:10]  BLOOD ADMINISTERED:none  DRAINS: none   LOCAL MEDICATIONS USED:  MARCAINE     SPECIMEN:  No Specimen  DISPOSITION OF SPECIMEN:  N/A  COUNTS:  YES  TOURNIQUET:  * No tourniquets in log *  DICTATION: .Dragon Dictation  PLAN OF CARE: Discharge to home after PACU  PATIENT DISPOSITION:  PACU - hemodynamically stable.   Delay start of Pharmacological VTE agent (>24hrs) due to surgical blood loss or risk of bleeding: not applicable  Details of procedure the patient was identified in the preoperative area and the site of surgery was confirmed and the chart was reviewed. She was taken to the operating room given appropriate amount of Ancef based on her weight of 171 pounds. After general anesthesia was administered via intubation without complication the patient was placed in modified beachchair position. The left arm and shoulder were prepped and draped sterilely. Timeout was completed.  An anterior incision was made from the anterior portion of the acromion distally 5 cm extended. Subcutaneous tissue was divided. Deltoid fascia was identified and split from the anterior corner of the acromion without detachment. Deep retractors were placed and a bursectomy was performed. The tear was noted to be longitudinal with minimal retraction. 2 sutures were placed in the tendon for traction and the tendon was reduced  and it reduced easily. However to assure that there was no tension on the repair superior and inferior releases were performed.  The greater tuberosity was abraded with a bur and then several drill holes were made to create a postoperative blood clot with stem cells from the marrow. Right he  We then did a side-to-side suture x2 with inverted mattress. We then placed a corkscrew anchor and apply 2 more sutures side to side. This gave an excellent reduction in a watertight closure. The wound was then irrigated and closed with 0 Monocryl for the deltoid 2-0 Monocryl for the subcutaneous and staples to reapproximate the skin.  We injected Marcaine with epinephrine 18 cc to start the case to help control bleeding. And then the balance of 60 cc was injected subacromial and into the soft tissue.  Sterile dressing was applied followed by application of a shoulder immobilizer  This patient will be allowed early passive range of motion as the tear was repaired with excellent approximation and no tension.   [id number]

## 2014-04-08 NOTE — Discharge Instructions (Signed)
Keep dressing clean and dry. Try to sleep upright in a recliner or on the sofa for the first week to control pain. Ice as needed apply 20-30 minutes at a time with a towel over the skin and apply every 2-3 hours as needed to control pain and swelling.  He can move her hand as tolerated keep her arm in a sling-and-swath                                         *

## 2014-04-08 NOTE — Anesthesia Preprocedure Evaluation (Signed)
Anesthesia Evaluation  Patient identified by MRN, date of birth, ID band Patient awake    Reviewed: Allergy & Precautions, H&P , NPO status , Patient's Chart, lab work & pertinent test results  Airway Mallampati: II TM Distance: >3 FB Neck ROM: Full    Dental  (+) Teeth Intact   Pulmonary neg pulmonary ROS,  breath sounds clear to auscultation        Cardiovascular hypertension, Pt. on medications Rhythm:Regular Rate:Normal     Neuro/Psych  Headaches, Seizures -,     GI/Hepatic GERD-  Controlled and Medicated,  Endo/Other  diabetes, Type 2  Renal/GU      Musculoskeletal   Abdominal   Peds  Hematology   Anesthesia Other Findings   Reproductive/Obstetrics                           Anesthesia Physical Anesthesia Plan  ASA: III  Anesthesia Plan: General   Post-op Pain Management:    Induction: Intravenous, Rapid sequence and Cricoid pressure planned  Airway Management Planned: Oral ETT  Additional Equipment:   Intra-op Plan:   Post-operative Plan: Extubation in OR  Informed Consent: I have reviewed the patients History and Physical, chart, labs and discussed the procedure including the risks, benefits and alternatives for the proposed anesthesia with the patient or authorized representative who has indicated his/her understanding and acceptance.     Plan Discussed with:   Anesthesia Plan Comments:         Anesthesia Quick Evaluation

## 2014-04-09 ENCOUNTER — Encounter (HOSPITAL_COMMUNITY): Payer: Self-pay | Admitting: Orthopedic Surgery

## 2014-04-13 ENCOUNTER — Ambulatory Visit (INDEPENDENT_AMBULATORY_CARE_PROVIDER_SITE_OTHER): Payer: Medicare Other | Admitting: Orthopedic Surgery

## 2014-04-13 ENCOUNTER — Encounter: Payer: Self-pay | Admitting: Orthopedic Surgery

## 2014-04-13 VITALS — BP 124/77 | Ht 67.0 in | Wt 171.0 lb

## 2014-04-13 DIAGNOSIS — S43429A Sprain of unspecified rotator cuff capsule, initial encounter: Secondary | ICD-10-CM | POA: Insufficient documentation

## 2014-04-13 DIAGNOSIS — Z5189 Encounter for other specified aftercare: Secondary | ICD-10-CM

## 2014-04-13 DIAGNOSIS — S43422D Sprain of left rotator cuff capsule, subsequent encounter: Secondary | ICD-10-CM

## 2014-04-13 DIAGNOSIS — M75102 Unspecified rotator cuff tear or rupture of left shoulder, not specified as traumatic: Secondary | ICD-10-CM

## 2014-04-13 MED ORDER — HYDROCODONE-ACETAMINOPHEN 10-325 MG PO TABS: 1.0000 | ORAL_TABLET | ORAL | Status: DC | PRN

## 2014-04-13 NOTE — Patient Instructions (Addendum)
[facility] Wear sling for 6 weeks, but straighten arm out several times a day You may shower, but keep incision covered

## 2014-04-13 NOTE — Progress Notes (Signed)
Patient ID: [name], female   DOB: Feb 02, 1946, 68 y.o.   MRN: [id number]  Chief Complaint  Patient presents with  . Follow-up    Post op # 1 left Rotator Cuff Surgery DOS 04/08/14    BP 124/77  Ht 5\' 7"  (1.702 m)  Wt 171 lb (77.565 kg)  BMI 26.78 kg/m2   PRE-OPERATIVE DIAGNOSIS:  Left Rotator Cuff Tear  POST-OPERATIVE DIAGNOSIS:  Left Rotator Cuff Tear  PROCEDURE:  Procedure(s): ROTATOR CUFF REPAIR SHOULDER OPEN (Left)  Implants one corkscrew anchor Total of 4 sutures 2 side-to-side sutures and 2 sutures from the corkscrew anchor were used to repair the tendon  IMPRESSION: 1. Full-thickness retracted supraspinatus tendon tear with maximal retraction of 2.5 cm. 2. Moderate infraspinatus tendinopathy and shallow articular surface tears. 3. Intact biceps tendon and glenoid labrum. 4. Type 2-3 acromion and moderate AC joint degenerative changes may contribute to bony impingement.   Doing well  Wound clean  Sling shot applied  Start therapy   Return 16 th for staples to come out then return 4 weeks from the 16th for 6 wk post op viist   (therapy in [place])

## 2014-04-22 ENCOUNTER — Ambulatory Visit (INDEPENDENT_AMBULATORY_CARE_PROVIDER_SITE_OTHER): Payer: Medicare Other | Admitting: Orthopedic Surgery

## 2014-04-22 VITALS — BP 113/71 | Ht 67.0 in | Wt 171.0 lb

## 2014-04-22 DIAGNOSIS — M75102 Unspecified rotator cuff tear or rupture of left shoulder, not specified as traumatic: Secondary | ICD-10-CM

## 2014-04-22 DIAGNOSIS — S43429A Sprain of unspecified rotator cuff capsule, initial encounter: Secondary | ICD-10-CM

## 2014-04-22 NOTE — Patient Instructions (Addendum)
Keep steri strips for 1 week, keep dry during this time Call to arrange therapy

## 2014-04-22 NOTE — Progress Notes (Signed)
Patient ID: [name], female   DOB: May 07, 1946, 68 y.o.   MRN: [id number] Patient in today for staple removal Removed staples, patient tolerated well, applied steri strips Patient advised to keep steri strips for 1 week and to keep dry during this time Patient to call to arrange therapy Follow up in 4 weeks with Dr Romeo AppleHarrison for post op #2

## 2014-04-23 ENCOUNTER — Ambulatory Visit: Payer: Medicare Other | Admitting: Orthopedic Surgery

## 2014-04-30 ENCOUNTER — Other Ambulatory Visit: Payer: Self-pay | Admitting: Gastroenterology

## 2014-04-30 ENCOUNTER — Ambulatory Visit (INDEPENDENT_AMBULATORY_CARE_PROVIDER_SITE_OTHER): Payer: Medicare Other | Admitting: Gastroenterology

## 2014-04-30 ENCOUNTER — Telehealth: Payer: Self-pay

## 2014-04-30 ENCOUNTER — Encounter: Payer: Self-pay | Admitting: Gastroenterology

## 2014-04-30 VITALS — BP 108/63 | HR 92 | Temp 98.0°F | Ht 67.5 in | Wt 164.4 lb

## 2014-04-30 DIAGNOSIS — K5900 Constipation, unspecified: Secondary | ICD-10-CM | POA: Insufficient documentation

## 2014-04-30 MED ORDER — PEG 3350-KCL-NA BICARB-NACL 420 G PO SOLR: 4000.0000 mL | ORAL | Status: DC

## 2014-04-30 NOTE — Assessment & Plan Note (Signed)
68 year old female with need for initial screening colonoscopy. Notes chronic constipation for past several years; question med effect +/- functional component. Start Linzess 145 mcg daily, sample voucher provided.  Proceed with colonoscopy with Dr. Darrick PennaFields in the near future. The risks, benefits, and alternatives have been discussed in detail with the patient. They state understanding and desire to proceed.

## 2014-04-30 NOTE — Progress Notes (Signed)
Referring Provider: Isabella Stallingondiego, Richard M, MD Primary Care Physician:  Isabella StallingNDIEGO,RICHARD M, MD Primary Gastroenterologist:  Dr. Darrick PennaFields  Chief Complaint  Patient presents with  . Colonoscopy  . Constipation    HPI:   Beth Meza presents today at the request of Dr. Janna ArchonDiego for screening colonoscopy. She has a history of constipation for the past 2-3 years. Feels like it is worsening despite eating a high fiber diet, exercising. No prior colonoscopy. No rectal bleeding. No abdominal pain. Has to take a laxative to have a BM.   Past Medical History  Diagnosis Date  . Back spasm   . Hand cramps   . Leg cramps   . Dizziness and giddiness 03/07/2013  . Headache(784.0) 03/07/2013  . Gastroesophageal reflux disease   . Degenerative arthritis   . Hypertension   . Dyslipidemia   . History of carpal tunnel syndrome     Bilateral  . Diabetes     diet controlled  . Memory deficit 02/25/2014  . Seizures     as child- unknown etiology-no meds  . Anemia     Past Surgical History  Procedure Laterality Date  . Back surgery    . Knee surgery Left     Pin, history of fracture  . Ankle surgery Left     History of ankle fracture  . Hand surgery      Carpal tunnel syndrome, trigger fingers bilaterally  . Shoulder open rotator cuff repair Left 04/08/2014    Dr. Romeo AppleHarrison    Current Outpatient Prescriptions  Medication Sig Dispense Refill  . amLODipine (NORVASC) 5 MG tablet Take 5 mg by mouth daily.      . cyclobenzaprine (FLEXERIL) 5 MG tablet Take 1 tablet (5 mg total) by mouth 3 (three) times daily as needed for muscle spasms.  60 tablet  1  . diclofenac sodium (VOLTAREN) 1 % GEL Apply topically 4 (four) times daily.      . ferrous sulfate 325 (65 FE) MG tablet Take 325 mg by mouth daily with breakfast.      . ibuprofen (ADVIL,MOTRIN) 800 MG tablet Take 1 tablet (800 mg total) by mouth every 8 (eight) hours as needed.  90 tablet  5  . lisinopril-hydrochlorothiazide  (PRINZIDE,ZESTORETIC) 20-25 MG per tablet Take 1 tablet by mouth daily.      . Multiple Vitamin (MULTIVITAMIN) tablet Take 1 tablet by mouth daily.      . pravastatin (PRAVACHOL) 40 MG tablet Take 40 mg by mouth daily.      . ranitidine (ZANTAC) 150 MG capsule Take 150 mg by mouth 2 (two) times daily.      Marland Kitchen. topiramate (TOPAMAX) 25 MG tablet Take 2 tablets (50 mg total) by mouth daily.  180 tablet  1  . vitamin C (ASCORBIC ACID) 500 MG tablet Take 500 mg by mouth daily.       No current facility-administered medications for this visit.    Allergies as of 04/30/2014  . (No Known Allergies)    Family History  Problem Relation Age of Onset  . Heart attack Father   . Colon cancer Neg Hx     History   Social History  . Marital Status: Divorced    Spouse Name: N/A    Number of Children: 3  . Years of Education: 14   Occupational History  . Retired     [employer]   Social History Main Topics  . Smoking status: Never Smoker   . Smokeless  tobacco: Never Used  . Alcohol Use: No  . Drug Use: No  . Sexual Activity: Yes    Birth Control/ Protection: Post-menopausal   Other Topics Concern  . Not on file   Social History Narrative  . No narrative on file    Review of Systems: Gen: Denies any fever, chills, loss of appetite, fatigue, weight loss. CV: Denies chest pain, heart palpitations, syncope, peripheral edema. Resp: +DOE GI: see HPI  GU : Denies urinary burning, urinary frequency, urinary incontinence.  MS: left arm recent rotator cuff repair recent Derm: Denies rash, itching, dry skin Psych: Denies depression, anxiety, confusion or memory loss  Heme: Denies bruising, bleeding, and enlarged lymph nodes.  Physical Exam: BP 108/63  Pulse 92  Temp(Src) 98 F (36.7 C) (Oral)  Ht 5' 7.5" (1.715 m)  Wt 164 lb 6.4 oz (74.571 kg)  BMI 25.35 kg/m2 General:   Alert and oriented. Well-developed, well-nourished, pleasant and cooperative. Head:  Normocephalic and  atraumatic. Eyes:  Conjunctiva pink, sclera clear, no icterus.   Conjunctiva pink. Ears:  Normal auditory acuity. Nose:  No deformity, discharge,  or lesions. Mouth:  No deformity or lesions, mucosa pink and moist.  Lungs:  Clear to auscultation bilaterally, without wheezing, rales, or rhonchi.  Heart:  S1, S2 present without murmurs noted.  Abdomen:  +BS, soft, non-tender and non-distended. Without mass or HSM. No rebound or guarding. No hernias noted. Rectal:  Deferred  Msk:  Symmetrical without gross deformities. Normal posture. LEFT extremity in sling, s/p recent rotator cuff surgery Extremities:  Without edema. Neurologic:  Alert and  oriented x4;  grossly normal neurologically. Skin:  Intact, warm and dry without significant lesions or rashes Psych:  Alert and cooperative. Normal mood and affect.

## 2014-04-30 NOTE — Patient Instructions (Signed)
We have scheduled you for a colonoscopy with Dr. Darrick PennaFields in the near future.  Start taking Linzess 1 capsule each morning, 30 minutes before breakfast. This is to help with constipation. You may have a few days of loose stool at first, but it should even out. If not, please call us. I have provided a voucher for you to have a month supply.

## 2014-04-30 NOTE — Telephone Encounter (Signed)
[facility] said pt came by and the Linzess prescription had not been sent in.

## 2014-05-01 ENCOUNTER — Encounter (HOSPITAL_COMMUNITY): Payer: Self-pay | Admitting: Pharmacy Technician

## 2014-05-01 NOTE — Progress Notes (Signed)
REVIEWED.  

## 2014-05-01 NOTE — Telephone Encounter (Signed)
REVIEWED.  

## 2014-05-04 ENCOUNTER — Encounter (HOSPITAL_COMMUNITY): Payer: Self-pay | Admitting: *Deleted

## 2014-05-04 ENCOUNTER — Ambulatory Visit (HOSPITAL_COMMUNITY)
Admission: RE | Admit: 2014-05-04 | Discharge: 2014-05-04 | Disposition: A | Discharge status: Home / Self Care | Payer: Medicare Other | Source: Ambulatory Visit | Attending: Gastroenterology | Admitting: Gastroenterology

## 2014-05-04 ENCOUNTER — Ambulatory Visit (HOSPITAL_COMMUNITY): Payer: Medicare Other

## 2014-05-04 ENCOUNTER — Encounter (HOSPITAL_COMMUNITY)
Admission: RE | Disposition: A | Discharge status: Home / Self Care | Payer: Self-pay | Source: Ambulatory Visit | Attending: Gastroenterology

## 2014-05-04 DIAGNOSIS — R42 Dizziness and giddiness: Secondary | ICD-10-CM | POA: Diagnosis not present

## 2014-05-04 DIAGNOSIS — Z79899 Other long term (current) drug therapy: Secondary | ICD-10-CM | POA: Insufficient documentation

## 2014-05-04 DIAGNOSIS — M1990 Unspecified osteoarthritis, unspecified site: Secondary | ICD-10-CM | POA: Diagnosis not present

## 2014-05-04 DIAGNOSIS — I10 Essential (primary) hypertension: Secondary | ICD-10-CM | POA: Insufficient documentation

## 2014-05-04 DIAGNOSIS — K219 Gastro-esophageal reflux disease without esophagitis: Secondary | ICD-10-CM | POA: Insufficient documentation

## 2014-05-04 DIAGNOSIS — R198 Other specified symptoms and signs involving the digestive system and abdomen: Secondary | ICD-10-CM | POA: Insufficient documentation

## 2014-05-04 DIAGNOSIS — R51 Headache: Secondary | ICD-10-CM | POA: Insufficient documentation

## 2014-05-04 DIAGNOSIS — Q438 Other specified congenital malformations of intestine: Secondary | ICD-10-CM | POA: Insufficient documentation

## 2014-05-04 DIAGNOSIS — K648 Other hemorrhoids: Secondary | ICD-10-CM | POA: Insufficient documentation

## 2014-05-04 DIAGNOSIS — D649 Anemia, unspecified: Secondary | ICD-10-CM | POA: Insufficient documentation

## 2014-05-04 DIAGNOSIS — E785 Hyperlipidemia, unspecified: Secondary | ICD-10-CM | POA: Insufficient documentation

## 2014-05-04 DIAGNOSIS — M5380 Other specified dorsopathies, site unspecified: Secondary | ICD-10-CM | POA: Diagnosis not present

## 2014-05-04 DIAGNOSIS — K5900 Constipation, unspecified: Secondary | ICD-10-CM

## 2014-05-04 HISTORY — PX: COLONOSCOPY: SHX5424

## 2014-05-04 LAB — GLUCOSE, CAPILLARY: Glucose-Capillary: 85 mg/dL (ref 70–99)

## 2014-05-04 SURGERY — COLONOSCOPY
Anesthesia: Moderate Sedation

## 2014-05-04 MED ORDER — LINACLOTIDE 145 MCG PO CAPS: ORAL_CAPSULE | ORAL | Status: DC

## 2014-05-04 MED ORDER — MIDAZOLAM HCL 5 MG/5ML IJ SOLN
INTRAMUSCULAR | Status: DC | PRN
  Administered 2014-05-04: 1 mg via INTRAVENOUS
  Administered 2014-05-04 (×2): 2 mg via INTRAVENOUS

## 2014-05-04 MED ORDER — STERILE WATER FOR IRRIGATION IR SOLN
Status: DC | PRN
  Administered 2014-05-04: 12:00:00

## 2014-05-04 MED ORDER — SODIUM CHLORIDE 0.9 % IV SOLN
INTRAVENOUS | Status: DC
  Administered 2014-05-04: 10:00:00 via INTRAVENOUS

## 2014-05-04 MED ORDER — MIDAZOLAM HCL 5 MG/5ML IJ SOLN
INTRAMUSCULAR | Status: AC
Start: 2014-05-04 — End: 2014-05-04
  Filled 2014-05-04: qty 10

## 2014-05-04 MED ORDER — MEPERIDINE HCL 100 MG/ML IJ SOLN
INTRAMUSCULAR | Status: DC | PRN
  Administered 2014-05-04: 25 mg via INTRAVENOUS
  Administered 2014-05-04: 50 mg via INTRAVENOUS

## 2014-05-04 MED ORDER — MEPERIDINE HCL 100 MG/ML IJ SOLN
INTRAMUSCULAR | Status: AC
  Filled 2014-05-04: qty 2

## 2014-05-04 NOTE — Progress Notes (Signed)
Patient back in room stated she feels better

## 2014-05-04 NOTE — Discharge Instructions (Signed)
You have SMALL internal hemorrhoids. YOU DID NOT HAVE ANY POLYPS. YOU HAVE CONSTIPATION BECAUSE YOU HAVE A REDUNDANT/FLOPPY COLON.  TO AVOID CONSTIPATION: 1. FOLLOW A HIGH FIBER DIET. AVOID ITEMS THAT CAUSE BLOATING. SEE INFO BELOW. 2. USE LINZESS 3. DRINK ENOUGH WATER TO KEEP URINE LIGHT YELLOW.  IF NEEDED YOU MAY USE PREPARATION H AS NEEDED FOR RECTAL PAIN OR DISCOMFORT.   NEXT COLONOSCOPY IN 10 YEARS.   Colonoscopy Care After Read the instructions outlined below and refer to this sheet in the next week. These discharge instructions provide you with general information on caring for yourself after you leave the hospital. While your treatment has been planned according to the most current medical practices available, unavoidable complications occasionally occur. If you have any problems or questions after discharge, call DR. , [phone].  ACTIVITY  You may resume your regular activity, but move at a slower pace for the next 24 hours.   Take frequent rest periods for the next 24 hours.   Walking will help get rid of the air and reduce the bloated feeling in your belly (abdomen).   No driving for 24 hours (because of the medicine (anesthesia) used during the test).   You may shower.   Do not sign any important legal documents or operate any machinery for 24 hours (because of the anesthesia used during the test).    NUTRITION  Drink plenty of fluids.   You may resume your normal diet as instructed by your doctor.   Begin with a light meal and progress to your normal diet. Heavy or fried foods are harder to digest and may make you feel sick to your stomach (nauseated).   Avoid alcoholic beverages for 24 hours or as instructed.    MEDICATIONS  You may resume your normal medications.   WHAT YOU CAN EXPECT TODAY  Some feelings of bloating in the abdomen.   Passage of more gas than usual.   Spotting of blood in your stool or on the toilet paper  .  IF YOU HAD  POLYPS REMOVED DURING THE COLONOSCOPY:  Eat a soft diet IF YOU HAVE NAUSEA, BLOATING, ABDOMINAL PAIN, OR VOMITING.    FINDING OUT THE RESULTS OF YOUR TEST Not all test results are available during your visit. DR. Darrick Penna WILL CALL YOU WITHIN 7 DAYS OF YOUR PROCEDUE WITH YOUR RESULTS. Do not assume everything is normal if you have not heard from DR.  IN ONE WEEK, CALL HER OFFICE AT [phone].  SEEK IMMEDIATE MEDICAL ATTENTION AND CALL THE OFFICE: [phone] IF:  You have more than a spotting of blood in your stool.   Your belly is swollen (abdominal distention).   You are nauseated or vomiting.   You have a temperature over 101F.   You have abdominal pain or discomfort that is severe or gets worse throughout the day.  Constipation in Adults Constipation is having fewer than 2 bowel movements per week. Usually, the stools are hard. As we grow older, constipation is more common. If you try to fix constipation with laxatives, the problem may get worse. This is because laxatives taken over a long period of time make the colon muscles weaker. A low-fiber diet, not taking in enough fluids, HAVING VAGINAL DELIVERIES, and taking some medicines may make these problems worse.  HOME CARE INSTRUCTIONS  Constipation is usually best cared for without medicines. Increasing dietary fiber and eating more fruits and vegetables is the best way to manage constipation.   Slowly increase fiber intake  to 25 to 38 grams per day. Whole grains, fruits, vegetables, and legumes are good sources of fiber. A dietitian can further help you incorporate high-fiber foods into your diet.   Drink enough water and fluids to keep your urine clear or pale yellow.   A fiber supplement may be added to your diet if you cannot get enough fiber from foods.   Increasing your activities also helps improve regularity.   High-Fiber Diet A high-fiber diet changes your normal diet to include more whole grains, legumes,  fruits, and vegetables. Changes in the diet involve replacing refined carbohydrates with unrefined foods. The calorie level of the diet is essentially unchanged. The Dietary Reference Intake (recommended amount) for adult males is 38 grams per day. For adult females, it is 25 grams per day. Pregnant and lactating women should consume 28 grams of fiber per day.    Fiber is the intact part of a plant that is not broken down during digestion. Functional fiber is fiber that has been isolated from the plant to provide a beneficial effect in the body.  PURPOSE  Increase stool bulk.   Ease and regulate bowel movements.   Lower cholesterol.    INDICATIONS THAT YOU NEED MORE FIBER  Constipation and hemorrhoids.   Uncomplicated diverticulosis (intestine condition) and irritable bowel syndrome.   Weight management.   As a protective measure against hardening of the arteries (atherosclerosis), diabetes, and cancer.   GUIDELINES FOR INCREASING FIBER IN THE DIET  Start adding fiber to the diet slowly. A gradual increase of about 5 more grams (2 slices of whole-wheat bread, 2 servings of most fruits or vegetables, or 1 bowl of high-fiber cereal) per day is best. Too rapid an increase in fiber may result in constipation, flatulence, and bloating.   Drink enough water and fluids to keep your urine clear or pale yellow. Water, juice, or caffeine-free drinks are recommended. Not drinking enough fluid may cause constipation.   Eat a variety of high-fiber foods rather than one type of fiber.   Try to increase your intake of fiber through using high-fiber foods rather than fiber pills or supplements that contain small amounts of fiber.   The goal is to change the types of food eaten. Do not supplement your present diet with high-fiber foods, but replace foods in your present diet.  INCLUDE A VARIETY OF FIBER SOURCES  Replace refined and processed grains with whole grains, canned fruits with fresh  fruits, and incorporate other fiber sources. White rice, white breads, and most bakery goods contain little or no fiber.   Brown whole-grain rice, buckwheat oats, and many fruits and vegetables are all good sources of fiber. These include: broccoli, Brussels sprouts, cabbage, cauliflower, beets, sweet potatoes, white potatoes (skin on), carrots, tomatoes, eggplant, squash, berries, fresh fruits, and dried fruits.   Cereals appear to be the richest source of fiber. Cereal fiber is found in whole grains and bran. Bran is the fiber-rich outer coat of cereal grain, which is largely removed in refining. In whole-grain cereals, the bran remains. In breakfast cereals, the largest amount of fiber is found in those with "bran" in their names. The fiber content is sometimes indicated on the label.      You may need to include additional fruits and vegetables each day.   In baking, for 1 cup white flour, you may use the following substitutions:   1 cup whole-wheat flour minus 2 tablespoons.   1/2 cup white flour plus 1/2 cup whole-wheat  flour.   Hemorrhoids Hemorrhoids are dilated (enlarged) veins around the rectum. Sometimes clots will form in the veins. This makes them swollen and painful. These are called thrombosed hemorrhoids. Causes of hemorrhoids include:  Constipation.   Straining to have a bowel movement.   HEAVY LIFTING HOME CARE INSTRUCTIONS  Eat a well balanced diet and drink 6 to 8 glasses of water every day to avoid constipation. You may also use a bulk laxative.   Avoid straining to have bowel movements.   Keep anal area dry and clean.   Do not use a donut shaped pillow or sit on the toilet for long periods. This increases blood pooling and pain.   Move your bowels when your body has the urge; this will require less straining and will decrease pain and pressure.

## 2014-05-04 NOTE — H&P (Addendum)
Primary Care Physician:  Isabella StallingNDIEGO,RICHARD M, MD Primary Gastroenterologist:  Dr. Darrick Penna  Pre-Procedure History & Physical: HPI:  [name] is a 68 y.o. female here for change in bowel habits.  Past Medical History  Diagnosis Date  . Back spasm   . Hand cramps   . Leg cramps   . Dizziness and giddiness 03/07/2013  . Headache(784.0) 03/07/2013  . Gastroesophageal reflux disease   . Degenerative arthritis   . Hypertension   . Dyslipidemia   . History of carpal tunnel syndrome     Bilateral  . Diabetes     diet controlled  . Memory deficit 02/25/2014  . Seizures     as child- unknown etiology-no meds  . Anemia     Past Surgical History  Procedure Laterality Date  . Back surgery    . Knee surgery Left     Pin, history of fracture  . Ankle surgery Left     History of ankle fracture  . Hand surgery      Carpal tunnel syndrome, trigger fingers bilaterally  . Shoulder open rotator cuff repair Left 04/08/2014    Dr. Romeo AppleHarrison    Prior to Admission medications   Medication Sig Start Date End Date Taking? Authorizing Provider  amLODipine (NORVASC) 5 MG tablet Take 5 mg by mouth daily.   Yes Historical Provider, MD  ferrous sulfate 325 (65 FE) MG tablet Take 325 mg by mouth daily with breakfast.   Yes Historical Provider, MD  HYDROcodone-acetaminophen (NORCO) 10-325 MG per tablet Take 1 tablet by mouth every 6 (six) hours as needed for moderate pain.   Yes Historical Provider, MD  ibuprofen (ADVIL,MOTRIN) 800 MG tablet Take 1 tablet (800 mg total) by mouth every 8 (eight) hours as needed. 04/08/14  Yes Vickki HearingStanley E Harrison, MD  lisinopril-hydrochlorothiazide (PRINZIDE,ZESTORETIC) 20-25 MG per tablet Take 1 tablet by mouth daily.   Yes Historical Provider, MD  Multiple Vitamin (MULTIVITAMIN) tablet Take 1 tablet by mouth daily.   Yes Historical Provider, MD  Omega-3 Fatty Acids (FISH OIL) 1000 MG CAPS Take 1 capsule by mouth daily.   Yes Historical Provider, MD  polyethylene  glycol-electrolytes (TRILYTE) 420 G solution Take 4,000 mLs by mouth as directed. 04/30/14  Yes West BaliSandi L , MD  pravastatin (PRAVACHOL) 40 MG tablet Take 40 mg by mouth daily. 02/11/14  Yes Historical Provider, MD  ranitidine (ZANTAC) 150 MG capsule Take 150 mg by mouth 2 (two) times daily.   Yes Historical Provider, MD  topiramate (TOPAMAX) 25 MG tablet Take 50 mg by mouth daily.   Yes Historical Provider, MD  vitamin C (ASCORBIC ACID) 500 MG tablet Take 500 mg by mouth daily.   Yes Historical Provider, MD  cyclobenzaprine (FLEXERIL) 5 MG tablet Take 1 tablet (5 mg total) by mouth 3 (three) times daily as needed for muscle spasms. 04/08/14   Vickki HearingStanley E Harrison, MD  diclofenac sodium (VOLTAREN) 1 % GEL Apply topically 4 (four) times daily.    Historical Provider, MD    Allergies as of 04/30/2014  . (No Known Allergies)    Family History  Problem Relation Age of Onset  . Heart attack Father   . Colon cancer Neg Hx     History   Social History  . Marital Status: Divorced    Spouse Name: N/A    Number of Children: 3  . Years of Education: 14   Occupational History  . Retired     [employer]   Social History Main Topics  .  Smoking status: Never Smoker   . Smokeless tobacco: Never Used  . Alcohol Use: No  . Drug Use: No  . Sexual Activity: Yes    Birth Control/ Protection: Post-menopausal   Other Topics Concern  . Not on file   Social History Narrative  . No narrative on file    Review of Systems: See HPI, otherwise negative ROS   Physical Exam: BP 118/61  Pulse 80  Temp(Src) 97.7 F (36.5 C) (Oral)  Resp 9  SpO2 100% General:   Alert,  pleasant and cooperative in NAD Head:  Normocephalic and atraumatic. Neck:  Supple; Lungs:  Clear throughout to auscultation.    Heart:  Regular rate and rhythm. Abdomen:  Soft, nontender and nondistended. Normal bowel sounds, without guarding, and without rebound.   Neurologic:  Alert and  oriented x4;  grossly normal  neurologically.  Impression/Plan:     Change in bowel habits  Plan:  1. TCS TODAY

## 2014-05-04 NOTE — Progress Notes (Addendum)
Patient was getting dressed with tech in room. Tech turned to get patients' glasses and patient stood and fell. Patient vitals were taken and an assessment was done No abnormal findings noted.. Patient was taken to radiology for a CT of head. Patient appeared to have passed out on stretcher after she was assisted to it. Ice pack was given to place on head.

## 2014-05-04 NOTE — Telephone Encounter (Signed)
Dr. Darrick PennaFields sent in prescription.

## 2014-05-04 NOTE — Op Note (Signed)
[facility] [address] [place], [id number]   COLONOSCOPY PROCEDURE REPORT  PATIENT: Beth LabsGriggs, [name]  MR#: [id number] BIRTHDATE: 1946-05-12 , 68  yrs. old GENDER: Female ENDOSCOPIST: Jonette EvaSandi Fields, MD REFERRED XB:[id number]:Richard Janna Archondiego, MezaD. PROCEDURE DATE:  05/04/2014 PROCEDURE:   Colonoscopy, diagnostic INDICATIONS:Change in bowel habits. MEDICATIONS: Demerol 75 mg IV and Versed 5 mg IV  DESCRIPTION OF PROCEDURE:    Physical exam was performed.  Informed consent was obtained from the patient after explaining the benefits, risks, and alternatives to procedure.  The patient was connected to monitor and placed in left lateral position. Continuous oxygen was provided by nasal cannula and IV medicine administered through an indwelling cannula.  After administration of sedation and rectal exam, the patients rectum was intubated and the EC-[id number] ([id number])  colonoscope was advanced under direct visualization to the ileum.  The scope was removed slowly by carefully examining the color, texture, anatomy, and integrity mucosa on the way out.  The patient was recovered in endoscopy and discharged home in satisfactory condition.    COLON FINDINGS: The mucosa appeared normal in the terminal ileum.  , The left colon is extremely redundant.  Manual abdominal counter-pressure was used to reach the cecum.  The patient was moved on to their back to reach the cecum. OTHERWISE NORMAL COLON. Small internal hemorrhoids were found.  PREP QUALITY: good.  CECAL W/D TIME: 10 minutes     COMPLICATIONS: None  ENDOSCOPIC IMPRESSION: 1.   Normal mucosa in the terminal ileum 2.   The LEFT colon IS EXTREMELY redundant 3.   Small internal hemorrhoids  RECOMMENDATIONS: TO AVOID CONSTIPATION: 1.  FOLLOW A HIGH FIBER DIET.  AVOID ITEMS THAT CAUSE BLOATING. 2.  USE LINZESS 3.  DRINK ENOUGH WATER TO KEEP URINE LIGHT YELLOW. IF NEEDED MAY USE PREPARATION H AS NEEDED FOR RECTAL PAIN  OR DISCOMFORT.  NEXT COLONOSCOPY IN 10 YEARS with an overtube.       _______________________________ eSignedJonette Eva:  Sandi Fields, MD 05/04/2014 4:08 PM

## 2014-05-05 NOTE — Progress Notes (Signed)
cc'd to pcp 

## 2014-05-06 ENCOUNTER — Ambulatory Visit: Payer: Medicare Other | Attending: Orthopedic Surgery | Admitting: Physical Therapy

## 2014-05-06 DIAGNOSIS — M25619 Stiffness of unspecified shoulder, not elsewhere classified: Secondary | ICD-10-CM | POA: Insufficient documentation

## 2014-05-06 DIAGNOSIS — IMO0001 Reserved for inherently not codable concepts without codable children: Secondary | ICD-10-CM | POA: Diagnosis not present

## 2014-05-06 DIAGNOSIS — M25519 Pain in unspecified shoulder: Secondary | ICD-10-CM | POA: Insufficient documentation

## 2014-05-08 ENCOUNTER — Encounter (HOSPITAL_COMMUNITY): Payer: Self-pay | Admitting: Gastroenterology

## 2014-05-08 NOTE — OR Nursing (Signed)
Did post-op call on patient following procedure. Patient's daughter-Yolanda stated that she was very upset and stated, "I called up there and left a message for somebody to call me. I want to know how she fell." Explained to the patient that the nursing tech had just finished getting the patient dressed to go home and had sat the patient down in the chair and turned around to get the patient's glasses when the patient stood up and fell. Patsy LagerYolanda said that her mother was "bruised all over, and her shoulder that she had surgery on was draining." I asked if they had called the surgeon and she stated that the surgeon was out of town. Encouraged her to call and make an appointment with the surgeon to have her mother's shoulder evaluated, and she stated that she would. Gave Short Stay's phone number to patient's daughter and told her to feel free to call if she had any further questions. Dr. Darrick PennaFields notified of the above.

## 2014-05-12 ENCOUNTER — Ambulatory Visit (INDEPENDENT_AMBULATORY_CARE_PROVIDER_SITE_OTHER): Payer: Medicare Other

## 2014-05-12 ENCOUNTER — Ambulatory Visit (INDEPENDENT_AMBULATORY_CARE_PROVIDER_SITE_OTHER): Payer: Medicare Other | Admitting: Orthopedic Surgery

## 2014-05-12 VITALS — BP 116/79 | Ht 67.5 in | Wt 164.4 lb

## 2014-05-12 DIAGNOSIS — M25519 Pain in unspecified shoulder: Secondary | ICD-10-CM

## 2014-05-12 DIAGNOSIS — M25512 Pain in left shoulder: Secondary | ICD-10-CM

## 2014-05-12 DIAGNOSIS — Z01818 Encounter for other preprocedural examination: Secondary | ICD-10-CM

## 2014-05-12 LAB — CREATININE, SERUM: Creat: 1.88 mg/dL — ABNORMAL HIGH (ref 0.50–1.10)

## 2014-05-12 NOTE — Progress Notes (Signed)
Chief Complaint  Patient presents with  . Shoulder Pain    left shoulder pain, DOI 05/04/14 (FALL) status post left RCR 04/08/14    Patient was progressing well and then had a colonoscopy got up and fell and reinjured her left shoulder. She now can not raise her arm and has severe left shoulder pain. She's also developed an opening over the midportion of the wound which appears to be a suture abscess. I explained that today applied antibiotic ointment and a sterile dressing  Her range of motion now is painful and limited in all planes including external and internal rotation abduction and forward elevation  At this point despite the normal x-rays we took today she will need to have a arthrogram MRI to evaluate the cuff for re\re tear.

## 2014-05-12 NOTE — Patient Instructions (Signed)
No therapy, cancel it MRI will be scheduled

## 2014-05-13 ENCOUNTER — Ambulatory Visit: Payer: Medicare Other | Admitting: Rehabilitation

## 2014-05-18 ENCOUNTER — Encounter: Payer: Medicare Other | Admitting: Physical Therapy

## 2014-05-21 ENCOUNTER — Encounter: Payer: Medicare Other | Admitting: Physical Therapy

## 2014-05-21 ENCOUNTER — Ambulatory Visit (INDEPENDENT_AMBULATORY_CARE_PROVIDER_SITE_OTHER): Payer: Medicare Other | Admitting: Orthopedic Surgery

## 2014-05-21 ENCOUNTER — Other Ambulatory Visit: Payer: Self-pay | Admitting: *Deleted

## 2014-05-21 VITALS — BP 109/91 | Ht 67.5 in | Wt 164.4 lb

## 2014-05-21 DIAGNOSIS — S43429A Sprain of unspecified rotator cuff capsule, initial encounter: Secondary | ICD-10-CM

## 2014-05-21 DIAGNOSIS — M25512 Pain in left shoulder: Secondary | ICD-10-CM

## 2014-05-21 DIAGNOSIS — M75102 Unspecified rotator cuff tear or rupture of left shoulder, not specified as traumatic: Secondary | ICD-10-CM

## 2014-05-21 DIAGNOSIS — M25519 Pain in unspecified shoulder: Secondary | ICD-10-CM

## 2014-05-21 DIAGNOSIS — S43422D Sprain of left rotator cuff capsule, subsequent encounter: Secondary | ICD-10-CM

## 2014-05-21 DIAGNOSIS — Z5189 Encounter for other specified aftercare: Secondary | ICD-10-CM

## 2014-05-21 MED ORDER — CYCLOBENZAPRINE HCL 5 MG PO TABS
5.0000 mg | ORAL_TABLET | Freq: Three times a day (TID) | ORAL | Status: DC | PRN
Start: 2014-05-21 — End: 2014-05-21

## 2014-05-21 MED ORDER — CYCLOBENZAPRINE HCL 5 MG PO TABS: 5.0000 mg | ORAL_TABLET | Freq: Three times a day (TID) | ORAL | Status: DC | PRN

## 2014-05-21 MED ORDER — HYDROCODONE-ACETAMINOPHEN 10-325 MG PO TABS: 1.0000 | ORAL_TABLET | Freq: Four times a day (QID) | ORAL | Status: DC | PRN

## 2014-05-21 NOTE — Progress Notes (Signed)
Chief Complaint  Patient presents with  . Follow-up    recheck Left RCR, DOS 04/08/14    Still waiting for MRI with contrast that will be done August 18 or 19th she'll come back after that continue current pain medications refill medications as needed

## 2014-05-21 NOTE — Patient Instructions (Signed)
MRI 05/27/14 @ 9:30a Follow up here 06/02/14 @ 4p

## 2014-05-27 ENCOUNTER — Ambulatory Visit (HOSPITAL_COMMUNITY)
Admission: RE | Admit: 2014-05-27 | Discharge: 2014-05-27 | Disposition: A | Discharge status: Home / Self Care | Payer: Medicare Other | Source: Ambulatory Visit | Attending: Orthopedic Surgery | Admitting: Orthopedic Surgery

## 2014-05-27 ENCOUNTER — Encounter (HOSPITAL_COMMUNITY): Payer: Self-pay

## 2014-05-27 ENCOUNTER — Other Ambulatory Visit: Payer: Self-pay | Admitting: Orthopedic Surgery

## 2014-05-27 DIAGNOSIS — M25512 Pain in left shoulder: Secondary | ICD-10-CM

## 2014-05-27 DIAGNOSIS — M19019 Primary osteoarthritis, unspecified shoulder: Secondary | ICD-10-CM | POA: Diagnosis not present

## 2014-05-27 DIAGNOSIS — W19XXXA Unspecified fall, initial encounter: Secondary | ICD-10-CM | POA: Insufficient documentation

## 2014-05-27 DIAGNOSIS — S46819A Strain of other muscles, fascia and tendons at shoulder and upper arm level, unspecified arm, initial encounter: Secondary | ICD-10-CM | POA: Diagnosis not present

## 2014-05-27 DIAGNOSIS — M25519 Pain in unspecified shoulder: Secondary | ICD-10-CM | POA: Insufficient documentation

## 2014-05-27 MED ORDER — LIDOCAINE HCL (PF) 1 % IJ SOLN
INTRAMUSCULAR | Status: AC
  Administered 2014-05-27: 5 mL
  Filled 2014-05-27: qty 5

## 2014-05-27 MED ORDER — LIDOCAINE HCL (PF) 1 % IJ SOLN
5.0000 mL | Freq: Once | INTRAMUSCULAR | Status: AC
  Administered 2014-05-27: 5 mL

## 2014-05-27 MED ORDER — LIDOCAINE HCL (PF) 2 % IJ SOLN
INTRAMUSCULAR | Status: AC
  Administered 2014-05-27: 10 mL
  Filled 2014-05-27: qty 10

## 2014-05-27 MED ORDER — GADOBENATE DIMEGLUMINE 529 MG/ML IV SOLN
5.0000 mL | Freq: Once | INTRAVENOUS | Status: AC | PRN
  Administered 2014-05-27: 1 mL via INTRAVENOUS

## 2014-05-27 MED ORDER — POVIDONE-IODINE 10 % EX SOLN
CUTANEOUS | Status: DC | PRN
  Administered 2014-05-27: 11:00:00 via TOPICAL

## 2014-05-27 MED ORDER — IOHEXOL 300 MG/ML  SOLN
50.0000 mL | Freq: Once | INTRAMUSCULAR | Status: AC | PRN
  Administered 2014-05-27: 8 mL via INTRAVENOUS

## 2014-05-27 MED ORDER — LIDOCAINE HCL (PF) 2 % IJ SOLN
10.0000 mL | Freq: Once | INTRAMUSCULAR | Status: AC
  Administered 2014-05-27: 10 mL

## 2014-05-27 MED ORDER — POVIDONE-IODINE 10 % EX SOLN
CUTANEOUS | Status: AC
  Filled 2014-05-27: qty 15

## 2014-06-02 ENCOUNTER — Other Ambulatory Visit: Payer: Self-pay | Admitting: *Deleted

## 2014-06-02 ENCOUNTER — Ambulatory Visit (INDEPENDENT_AMBULATORY_CARE_PROVIDER_SITE_OTHER): Payer: Medicare Other | Admitting: Orthopedic Surgery

## 2014-06-02 VITALS — BP 108/69 | Ht 67.5 in | Wt 164.0 lb

## 2014-06-02 DIAGNOSIS — M75102 Unspecified rotator cuff tear or rupture of left shoulder, not specified as traumatic: Secondary | ICD-10-CM

## 2014-06-02 DIAGNOSIS — S43429A Sprain of unspecified rotator cuff capsule, initial encounter: Secondary | ICD-10-CM

## 2014-06-02 NOTE — Patient Instructions (Signed)
Surgery 06/10/14

## 2014-06-02 NOTE — Progress Notes (Signed)
Chief Complaint  Patient presents with  . Follow-up    MRI results of left shoulder with contrast. DOS 04-08-14.    The MRI with contrast shows a re\re tear the supraspinatus tendon. I've relayed information to the patient she will need a repeat repair likely is a graft jacket if necessary and use an open repair technique. We will have to do with the previously placed anchor.  Open rotator cuff repair left shoulder for a re\re tear rotator cuff after trauma

## 2014-06-03 NOTE — Patient Instructions (Signed)
[name]  06/03/2014   Your procedure is scheduled on:  Wednesday, September 2  Report to [facility] at [place] AM.  Call this number if you have problems the morning of surgery: [phone]   Remember:   Do not eat food or drink liquids after midnight.   Take these medicines the morning of surgery with A SIP OF WATER: Norvasc, Norco, Zantac, Topamax, Lisinopril-HCTZ   Do not wear jewelry, make-up or nail polish.  Do not wear lotions, powders, or perfumes. You may wear deodorant.  Do not shave 48 hours prior to surgery. Men may shave face and neck.  Do not bring valuables to the hospital.  [facility] is not responsible for any belongings or valuables.               Contacts, dentures or bridgework may not be worn into surgery.  Leave suitcase in the car. After surgery it may be brought to your room.  For patients admitted to the hospital, discharge time is determined by your treatment team.               Patients discharged the day of surgery will not be allowed to drive home.  Name and phone number of your driver: Family  Special Instructions: Shower using CHG 2 nights before surgery and the night before surgery.  If you shower the day of surgery use CHG.  Use special wash - you have one bottle of CHG for all showers.  You should use approximately 1/3 of the bottle for each shower.   Please read over the following fact sheets that you were given: Pain Booklet, Coughing and Deep Breathing, MRSA Information, Surgical Site Infection Prevention, Anesthesia Post-op Instructions and Care and Recovery After Surgery  PATIENT INSTRUCTIONS POST-ANESTHESIA  IMMEDIATELY FOLLOWING SURGERY:  Do not drive or operate machinery for the first twenty four hours after surgery.  Do not make any important decisions for twenty four hours after surgery or while taking narcotic pain medications or sedatives.  If you develop intractable nausea and vomiting or a severe headache please notify your doctor  immediately.  FOLLOW-UP:  Please make an appointment with your surgeon as instructed. You do not need to follow up with anesthesia unless specifically instructed to do so.  WOUND CARE INSTRUCTIONS (if applicable):  Keep a dry clean dressing on the anesthesia/puncture wound site if there is drainage.  Once the wound has quit draining you may leave it open to air.  Generally you should leave the bandage intact for twenty four hours unless there is drainage.  If the epidural site drains for more than 36-48 hours please call the anesthesia department.  QUESTIONS?:  Please feel free to call your physician or the hospital operator if you have any questions, and they will be happy to assist you.     Surgery for Rotator Cuff Tear with Rehab Rotator cuff surgery is only recommended for individuals who have experienced persistent disability for greater than 3 months of non-surgical (conservative) treatment. Surgery is not necessary but is recommended for individuals who experience difficulty completing daily activities or athletes who are unable to compete. Rotator cuff tears do not usually heal without surgical intervention. If left alone small rotator cuff tears usually become larger. Younger athletes who have a rotator cuff tear may be recommended for surgery without attempting conservative rehabilitation. The purpose of surgery is to regain function of the shoulder joint and eliminate pain associated with the injury. In addition to repairing the  tendon tear, the surgery often removes a portion of the bony roof of the shoulder (acromion) as well as the chronically thickened and inflamed membrane below the acromion (subacromial bursa). REASONS NOT TO OPERATE   Infection of the shoulder.  Inability to complete a rehabilitation program.  Patients who have other conditions (emotional or psychological) conditions that contribute to their shoulder condition. RISKS AND COMPLICATIONS  Infection.  Re-tear of  the rotator cuff tendons or muscles.  Shoulder stiffness and/or weakness.  Inability to compete in athletics.  Acromioclavicular (AC) joint paint.  Risks of surgery: infection, bleeding, nerve damage, or damage to surrounding tissues. TECHNIQUE There are different surgical procedures used to treat rotator cuff tears. The type of procedure depends on the extent of injury as well as the surgeon's preference. All of the surgical techniques for rotator cuff tears have the same goal of repairing the torn tendon, removing part of the acromion, and removing the subacromial bursa. There are two main types of procedures: arthroscopic and open incision. Arthroscopic procedures are usually completed and you go home the same day as surgery (outpatient). These procedures use multiple small incisions in which tools and a video camera are placed to work on the shoulder. An electric shaver removes the bursa, then a power burr shaves down the portion of the acromion that places pressure on the rotator cuff. Finally the rotator cuff is sewed (sutured) back to the humeral head. Open incision procedures require a larger incision. The deltoid muscle is detached from the acromion and a ligament in the shoulder (coracoacromial) is cut in order for the surgeon to access the rotator cuff. The subacromial bursa is removed as well as part of the acromion to give the rotator cuff room to move freely. The torn tendon is then sutured to the humeral head. After the rotator cuff is repaired, then the deltoid is reattached and the incision is closed up.  RECOVERY   Post-operative care depends on the surgical technique and the preferences of your therapist.  Keep the wound clean and dry for the first 10 to 14 days after surgery.  Keep your shoulder and arm in the sling provided to you for as long as you have been instructed to.  You will be given pain medications by your caregiver.  Passive (without using muscles) shoulder  movements may be begun immediately after surgery.  It is important to follow through with you rehabilitation program in order to have the best possible recovery. RETURN TO SPORTS   The rehabilitation period will depend on the sport and position you play as well as the success of the operation.  The minimum recovery period is 6 months.  You must have regained complete shoulder motion and strength before returning to sports. SEEK IMMEDIATE MEDICAL CARE IF:   Any medications produce adverse side effects.  Any complications from surgery occur:  Pain, numbness, or coldness in the extremity operated upon.  Discoloration of the nail beds (they become blue or gray) of the extremity operated upon.  Signs of infections (fever, pain, inflammation, redness, or persistent bleeding). EXERCISES  RANGE OF MOTION (ROM) AND STRETCHING EXERCISES - Rotator Cuff Tear, Surgery For These exercises may help you restore your elbow mobility once your physician has discontinued your immobilization period. Beginning these before your provider's approval may result in delayed healing. Your symptoms may resolve with or without further involvement from your physician, physical therapist or athletic trainer. While completing these exercises, remember:   Restoring tissue flexibility helps normal motion  to return to the joints. This allows healthier, less painful movement and activity.  An effective stretch should be held for at least 30 seconds. A stretch should never be painful. You should only feel a gentle lengthening or release in the stretch. ROM - Pendulum   Bend at the waist so that your right / left arm falls away from your body. Support yourself with your opposite hand on a solid surface, such as a table or a countertop.  Your right / left arm should be perpendicular to the ground. If it is not perpendicular, you need to lean over farther. Relax the muscles in your right / left arm and shoulder as much as  possible.  Gently sway your hips and trunk so they move your right / left arm without any use of your right / left shoulder muscles.  Progress your movements so that your right / left arm moves side to side, then forward and backward, and finally, both clockwise and counterclockwise.  Complete __________ repetitions in each direction. Many people use this exercise to relieve discomfort in their shoulder as well as to gain range of motion. Repeat __________ times. Complete this exercise __________ times per day. STRETCH - Flexion, Seated   Sit in a firm chair so that your right / left forearm can rest on a table or on a table or countertop. Your right / left elbow should rest below the height of your shoulder so that your shoulder feels supported and not tense or uncomfortable.  Keeping your right / left shoulder relaxed, lean forward at your waist, allowing your right / left hand to slide forward. Bend forward until you feel a moderate stretch in your shoulder, but before you feel an increase in your pain.  Hold __________ seconds. Slowly return to your starting position. Repeat __________ times. Complete this exercise __________ times per day.  STRETCH - Flexion, Standing   Stand with good posture. With an underhand grip on your right / left and an overhand grip on the opposite hand, grasp a broomstick or cane so that your hands are a little more than shoulder-width apart.  Keeping your right / left elbow straight and shoulder muscles relaxed, push the stick with your opposite hand to raise your right / left arm in front of your body and then overhead. Raise your arm until you feel a stretch in your right / left shoulder, but before you have increased shoulder pain.  Try to avoid shrugging your right / left shoulder as your arm rises by keeping your shoulder blade tucked down and toward your mid-back spine. Hold __________ seconds.  Slowly return to the starting position. Repeat __________  times. Complete this exercise __________ times per day.  STRETCH - Abduction, Supine   Stand with good posture. With an underhand grip on your right / left and an overhand grip on the opposite hand, grasp a broomstick or cane so that your hands are a little more than shoulder-width apart.  Keeping your right / left elbow straight and shoulder muscles relaxed, push the stick with your opposite hand to raise your right / left arm out to the side of your body and then overhead. Raise your arm until you feel a stretch in your right / left shoulder, but before you have increased shoulder pain.  Try to avoid shrugging your right / left shoulder as your arm rises by keeping your shoulder blade tucked down and toward your mid-back spine. Hold __________ seconds.  Slowly return  to the starting position. Repeat __________ times. Complete this exercise __________ times per day.  ROM - Flexion, Active-Assisted  Lie on your back. You may bend your knees for comfort.  Grasp a broomstick or cane so your hands are about shoulder-width apart. Your right / left hand should grip the end of the stick/cane so that your hand is positioned "thumbs-up," as if you were about to shake hands.  Using your healthy arm to lead, raise your right / left arm overhead until you feel a gentle stretch in your shoulder. Hold __________ seconds.  Use the stick/cane to assist in returning your right / left arm to its starting position. Repeat __________ times. Complete this exercise __________ times per day.  STRETCH - External Rotation   Tuck a folded towel or small ball under your right / left upper arm. Grasp a broomstick or cane with an underhand grasp a little more than shoulder width apart. Bend your elbows to 90 degrees.  Stand with good posture or sit in a chair without arms.  Use your strong arm to push the stick across your body. Do not allow the towel or ball to fall. This will rotate your right / left arm away from  your abdomen. Using the stick turn/rotate your hand and forearm away from your body. Hold __________ seconds. Repeat __________ times. Complete this exercise __________ times per day.  STRENGTHENING EXERCISES - Rotator Cuff Tear, Surgery For These exercises may help you begin to restore your elbow strength in the initial stage of your rehabilitation. Your physician will determine when you begin these exercises depending on the severity of your injury and the integrity of your repaired tissues. Beginning these before your provider's approval may result in delayed healing. While completing these exercises, remember:   Muscles can gain both the endurance and the strength needed for everyday activities through controlled exercises.  Complete these exercises as instructed by your physician, physical therapist or athletic trainer. Progress the resistance and repetitions only as guided.  You may experience muscle soreness or fatigue, but the pain or discomfort you are trying to eliminate should never worsen during these exercises. If this pain does worsen, stop and make certain you are following the directions exactly. If the pain is still present after adjustments, discontinue the exercise until you can discuss the trouble with your clinician. STRENGTH - Shoulder Flexion, Isometric   With good posture and facing a wall, stand or sit about 4-6 inches away.  Keeping your right / left elbow straight, gently press the top of your fist into the wall. Increase the pressure gradually until you are pressing as hard as you can without shrugging your shoulder or increasing any shoulder discomfort.  Hold __________ seconds. Release the tension slowly. Relax your shoulder muscles completely before you do the next repetition. Repeat __________ times. Complete this exercise __________ times per day.  STRENGTH - Shoulder Abductors, Isometric   With good posture, stand or sit about 4-6 inches from a wall with your  right / left side facing the wall.  Bend your right / left elbow. Gently press your right / left elbow into the wall. Increase the pressure gradually until you are pressing as hard as you can without shrugging your shoulder or increasing any shoulder discomfort.  Hold __________ seconds.  Release the tension slowly. Relax your shoulder muscles completely before you do the next repetition. Repeat __________ times. Complete this exercise __________ times per day.  STRENGTH - Internal Rotators, Isometric  Keep your right / left elbow at your side and bend it 90 degrees.  Step into a door frame so that the inside of your right / left wrist can press against the door frame without your upper arm leaving your side.  Gently press your right / left wrist into the door frame as if you were trying to draw the palm of your hand to your abdomen. Gradually increase the tension until you are pressing as hard as you can without shrugging your shoulder or increasing any shoulder discomfort.  Hold __________ seconds.  Release the tension slowly. Relax your shoulder muscles completely before you do the next repetition. Repeat __________ times. Complete this exercise __________ times per day.  STRENGTH - External Rotators, Isometric   Keep your right / left elbow at your side and bend it 90 degrees.  Step into a door frame so that the outside of your right / left wrist can press against the door frame without your upper arm leaving your side.  Gently press your right / left wrist into the door frame as if you were trying to swing the back of your hand away from your abdomen. Gradually increase the tension until you are pressing as hard as you can without shrugging your shoulder or increasing any shoulder discomfort.  Hold __________ seconds.  Release the tension slowly. Relax your shoulder muscles completely before you do the next repetition. Repeat __________ times. Complete this exercise __________  times per day.

## 2014-06-05 ENCOUNTER — Encounter (HOSPITAL_COMMUNITY): Payer: Self-pay | Admitting: Pharmacy Technician

## 2014-06-05 ENCOUNTER — Telehealth: Payer: Self-pay | Admitting: Orthopedic Surgery

## 2014-06-05 ENCOUNTER — Encounter (HOSPITAL_COMMUNITY)
Admission: RE | Admit: 2014-06-05 | Discharge: 2014-06-05 | Disposition: A | Discharge status: Home / Self Care | Payer: Medicare Other | Source: Ambulatory Visit | Attending: Orthopedic Surgery | Admitting: Orthopedic Surgery

## 2014-06-05 ENCOUNTER — Encounter (HOSPITAL_COMMUNITY): Payer: Self-pay

## 2014-06-05 DIAGNOSIS — M719 Bursopathy, unspecified: Principal | ICD-10-CM | POA: Insufficient documentation

## 2014-06-05 DIAGNOSIS — M67919 Unspecified disorder of synovium and tendon, unspecified shoulder: Secondary | ICD-10-CM | POA: Insufficient documentation

## 2014-06-05 DIAGNOSIS — Z01818 Encounter for other preprocedural examination: Secondary | ICD-10-CM | POA: Diagnosis present

## 2014-06-05 LAB — BASIC METABOLIC PANEL
ANION GAP: 13 (ref 5–15)
BUN: 19 mg/dL (ref 6–23)
CALCIUM: 9.4 mg/dL (ref 8.4–10.5)
CO2: 24 mEq/L (ref 19–32)
Chloride: 104 mEq/L (ref 96–112)
Creatinine, Ser: 1.4 mg/dL — ABNORMAL HIGH (ref 0.50–1.10)
GFR calc Af Amer: 44 mL/min — ABNORMAL LOW (ref >90)
GFR, EST NON AFRICAN AMERICAN: 38 mL/min — AB (ref >90)
Glucose, Bld: 94 mg/dL (ref 70–99)
Potassium: 4.4 mEq/L (ref 3.7–5.3)
SODIUM: 141 meq/L (ref 137–147)

## 2014-06-05 LAB — HEMOGLOBIN AND HEMATOCRIT, BLOOD
HEMATOCRIT: 32.7 % — AB (ref 36.0–46.0)
Hemoglobin: 11.3 g/dL — ABNORMAL LOW (ref 12.0–15.0)

## 2014-06-05 NOTE — Telephone Encounter (Signed)
Regarding surgery scheduled for 06/10/14 at [facility], CPT [phone] /   -per insurers,primary, Medicare (guidelines) - no pre-authorization required; per secondary, Medicaid's online system, [employer], no pre-authorization required.

## 2014-06-05 NOTE — Pre-Procedure Instructions (Signed)
Patient given information to sign up for my chart at home. 

## 2014-06-08 ENCOUNTER — Telehealth: Payer: Self-pay | Admitting: Orthopedic Surgery

## 2014-06-08 NOTE — Telephone Encounter (Signed)
Patient called, states is needing refill on her pain medication, however, also said "surgery is this Wednesday, 06/10/14, so I can just wait."  Please advise.  Her ph# i [phone] (Mobile)

## 2014-06-09 ENCOUNTER — Other Ambulatory Visit: Payer: Self-pay | Admitting: *Deleted

## 2014-06-09 MED ORDER — HYDROCODONE-ACETAMINOPHEN 10-325 MG PO TABS: 1.0000 | ORAL_TABLET | Freq: Four times a day (QID) | ORAL | Status: DC | PRN

## 2014-06-09 NOTE — H&P (Signed)
  Chief complaint recurrent pain left shoulder after falling status post colonoscopy  The patient was doing well after her rotator cuff repair approximately 4-6 weeks ago and then fell after her colonoscopy when she became dizzy prior to discharge.  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 She's had pain for 4 months. She's been on tramadol no relief. She says she can no longer do things for herself and is concerned about the pain. Pain is now become constant.  Review of systems she has a history of recent weight loss night sweats hearing loss and ringing in the ears wearing a hearing aid blood clot swelling of the arms and legs shortness of breath breathing difficulties heartburn change in bowel habits musculoskeletal pains including back pain she wears glasses has some vision issues she has a history of headaches and dizziness memory problems balance problems numbness and tingling she has a history of diabetes other systems were normal  Medical history diabetes hypertension arthritis  Surgeries include surgery on her lumbar spine tubal ligation internal fixation left tibia trigger release x4 and a colon biopsy  Current medications amlodipine 5 mg topiramate 50 mg ranitidine 150 mg pravastatin 40 mg Reck salt 25 mg multivitamin lisinopril 20/12.5 mg combination tablet tramadol 50 mg iron fish oil no allergies. Family history diabetes tuberculosis asthma heart disease hypertension arthritis parents are 63 years old and 69T43 years old when they died  General appearance is normal, the patient is alert and oriented x3 with normal mood and affect. BP 131/79  Ht  (1.702 m)  Wt 177 lb (80.287 kg)  BMI 27.72 kg/m2  No current gait disturbance  Lower extremity  Inspection and  palpation revealed no major abnormalities.  Range of motion is full without contracture.  Motor exam is normal with grade 5 strength.  The joints are fully reduced without subluxation.  There is no atrophy or tremor and muscle tone is normal.  All joints are stable.  Left shoulder well-healed incision with pain and tenderness and inability to raise the arm in any plane. No significant swelling.  She also has some cervical spine tenderness and what appears to be hyperextension deformity  Right shoulder full range of motion normal strength no instability skin is normal.  Neurovascular exam remains intact   Posttraumatic MRI IMPRESSION: 1. 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 No fatty atrophy. 2. 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 3. Study technically degraded by motion artifact.             Original MRI IMPRESSION: 1. Full-thickness retracted supraspinatus tendon tear with maximal retraction of 2.5 cm. 2. Moderate infraspinatus tendinopathy and shallow articular surface tears. 3. Intact biceps tendon and glenoid labrum. 4. Type 2-3 acromion and moderate AC joint degenerative changes may contribute to bony impingement.   DX LEFT ROTATOR CUFF recurrent post traumatic tear left shoulder PLAN REPEAT OPEN ROTATOR CUFF REPAIR left shoulder

## 2014-06-09 NOTE — Telephone Encounter (Signed)
Prescription available for pick up, called patient, no answer, left vm 

## 2014-06-10 ENCOUNTER — Ambulatory Visit (HOSPITAL_COMMUNITY): Payer: Medicare Other | Admitting: Anesthesiology

## 2014-06-10 ENCOUNTER — Encounter (HOSPITAL_COMMUNITY)
Admission: RE | Disposition: A | Discharge status: Home / Self Care | Payer: Self-pay | Source: Ambulatory Visit | Attending: Orthopedic Surgery

## 2014-06-10 ENCOUNTER — Encounter (HOSPITAL_COMMUNITY): Payer: Self-pay | Admitting: *Deleted

## 2014-06-10 ENCOUNTER — Ambulatory Visit (HOSPITAL_COMMUNITY)
Admission: RE | Admit: 2014-06-10 | Discharge: 2014-06-10 | Disposition: A | Discharge status: Home / Self Care | Payer: Medicare Other | Source: Ambulatory Visit | Attending: Orthopedic Surgery | Admitting: Orthopedic Surgery

## 2014-06-10 ENCOUNTER — Encounter (HOSPITAL_COMMUNITY): Payer: Medicare Other | Admitting: Anesthesiology

## 2014-06-10 DIAGNOSIS — I10 Essential (primary) hypertension: Secondary | ICD-10-CM | POA: Insufficient documentation

## 2014-06-10 DIAGNOSIS — Z79899 Other long term (current) drug therapy: Secondary | ICD-10-CM | POA: Diagnosis not present

## 2014-06-10 DIAGNOSIS — W19XXXA Unspecified fall, initial encounter: Secondary | ICD-10-CM | POA: Insufficient documentation

## 2014-06-10 DIAGNOSIS — M75102 Unspecified rotator cuff tear or rupture of left shoulder, not specified as traumatic: Secondary | ICD-10-CM

## 2014-06-10 DIAGNOSIS — R51 Headache: Secondary | ICD-10-CM | POA: Diagnosis not present

## 2014-06-10 DIAGNOSIS — M67919 Unspecified disorder of synovium and tendon, unspecified shoulder: Secondary | ICD-10-CM | POA: Insufficient documentation

## 2014-06-10 DIAGNOSIS — S43429A Sprain of unspecified rotator cuff capsule, initial encounter: Secondary | ICD-10-CM | POA: Diagnosis not present

## 2014-06-10 DIAGNOSIS — K219 Gastro-esophageal reflux disease without esophagitis: Secondary | ICD-10-CM | POA: Diagnosis not present

## 2014-06-10 DIAGNOSIS — E119 Type 2 diabetes mellitus without complications: Secondary | ICD-10-CM | POA: Insufficient documentation

## 2014-06-10 DIAGNOSIS — M19019 Primary osteoarthritis, unspecified shoulder: Secondary | ICD-10-CM | POA: Diagnosis not present

## 2014-06-10 DIAGNOSIS — M719 Bursopathy, unspecified: Secondary | ICD-10-CM | POA: Diagnosis not present

## 2014-06-10 HISTORY — PX: SHOULDER OPEN ROTATOR CUFF REPAIR: SHX2407

## 2014-06-10 HISTORY — PX: SHOULDER ACROMIOPLASTY: SHX6093

## 2014-06-10 LAB — GLUCOSE, CAPILLARY
GLUCOSE-CAPILLARY: 76 mg/dL (ref 70–99)
GLUCOSE-CAPILLARY: 158 mg/dL — AB (ref 70–99)
Glucose-Capillary: 74 mg/dL (ref 70–99)

## 2014-06-10 SURGERY — REPAIR, ROTATOR CUFF, OPEN
Anesthesia: General | Site: Shoulder | Laterality: Left

## 2014-06-10 MED ORDER — LACTATED RINGERS IV SOLN
INTRAVENOUS | Status: DC
  Administered 2014-06-10 (×2): via INTRAVENOUS

## 2014-06-10 MED ORDER — DEXTROSE 50 % IV SOLN
INTRAVENOUS | Status: AC
  Filled 2014-06-10: qty 50

## 2014-06-10 MED ORDER — FENTANYL CITRATE 0.05 MG/ML IJ SOLN
INTRAMUSCULAR | Status: AC
  Filled 2014-06-10: qty 5

## 2014-06-10 MED ORDER — DEXTROSE 50 % IV SOLN
12.5000 g | Freq: Once | INTRAVENOUS | Status: AC
  Administered 2014-06-10: 12.5 g via INTRAVENOUS

## 2014-06-10 MED ORDER — DEXTROSE IN LACTATED RINGERS 5 % IV SOLN
INTRAVENOUS | Status: DC | PRN
  Administered 2014-06-10: 11:00:00 via INTRAVENOUS

## 2014-06-10 MED ORDER — ONDANSETRON HCL 4 MG/2ML IJ SOLN
INTRAMUSCULAR | Status: AC
  Filled 2014-06-10: qty 2

## 2014-06-10 MED ORDER — METHOCARBAMOL 1000 MG/10ML IJ SOLN
500.0000 mg | Freq: Once | INTRAVENOUS | Status: AC
  Administered 2014-06-10: 500 mg via INTRAVENOUS
  Filled 2014-06-10: qty 5

## 2014-06-10 MED ORDER — FENTANYL CITRATE 0.05 MG/ML IJ SOLN
INTRAMUSCULAR | Status: AC
  Filled 2014-06-10: qty 2

## 2014-06-10 MED ORDER — ONDANSETRON HCL 4 MG/2ML IJ SOLN
4.0000 mg | Freq: Once | INTRAMUSCULAR | Status: AC
  Administered 2014-06-10: 4 mg via INTRAVENOUS

## 2014-06-10 MED ORDER — CELECOXIB 400 MG PO CAPS
400.0000 mg | ORAL_CAPSULE | Freq: Once | ORAL | Status: AC
  Administered 2014-06-10: 400 mg via ORAL
  Filled 2014-06-10: qty 1

## 2014-06-10 MED ORDER — FENTANYL CITRATE 0.05 MG/ML IJ SOLN
25.0000 ug | INTRAMUSCULAR | Status: DC | PRN
  Administered 2014-06-10 (×2): 25 ug via INTRAVENOUS
  Filled 2014-06-10: qty 2

## 2014-06-10 MED ORDER — NEOSTIGMINE METHYLSULFATE 10 MG/10ML IV SOLN
INTRAVENOUS | Status: DC | PRN
  Administered 2014-06-10: 3 mg via INTRAVENOUS

## 2014-06-10 MED ORDER — CEFAZOLIN SODIUM-DEXTROSE 2-3 GM-% IV SOLR
INTRAVENOUS | Status: AC
  Filled 2014-06-10: qty 50

## 2014-06-10 MED ORDER — EPHEDRINE SULFATE 50 MG/ML IJ SOLN
INTRAMUSCULAR | Status: DC | PRN
  Administered 2014-06-10 (×3): 5 mg via INTRAVENOUS
  Administered 2014-06-10: 10 mg via INTRAVENOUS

## 2014-06-10 MED ORDER — PHENYLEPHRINE HCL 10 MG/ML IJ SOLN
INTRAMUSCULAR | Status: DC | PRN
  Administered 2014-06-10 (×2): 100 ug via INTRAVENOUS
  Administered 2014-06-10: 50 ug via INTRAVENOUS
  Administered 2014-06-10: 100 ug via INTRAVENOUS

## 2014-06-10 MED ORDER — PROPOFOL 10 MG/ML IV BOLUS
INTRAVENOUS | Status: AC
  Filled 2014-06-10: qty 20

## 2014-06-10 MED ORDER — DEXTROSE 50 % IV SOLN
12.5000 mL/kg | Freq: Once | INTRAVENOUS | Status: AC
  Administered 2014-06-10: 50 mL via INTRAVENOUS

## 2014-06-10 MED ORDER — NEOSTIGMINE METHYLSULFATE 10 MG/10ML IV SOLN
INTRAVENOUS | Status: AC
  Filled 2014-06-10: qty 1

## 2014-06-10 MED ORDER — BUPIVACAINE-EPINEPHRINE (PF) 0.5% -1:200000 IJ SOLN
INTRAMUSCULAR | Status: AC
  Filled 2014-06-10: qty 60

## 2014-06-10 MED ORDER — CEFAZOLIN SODIUM-DEXTROSE 2-3 GM-% IV SOLR
2.0000 g | INTRAVENOUS | Status: AC
  Administered 2014-06-10: 2 g via INTRAVENOUS

## 2014-06-10 MED ORDER — SUCCINYLCHOLINE CHLORIDE 20 MG/ML IJ SOLN
INTRAMUSCULAR | Status: AC
  Filled 2014-06-10: qty 1

## 2014-06-10 MED ORDER — ROCURONIUM BROMIDE 100 MG/10ML IV SOLN
INTRAVENOUS | Status: DC | PRN
  Administered 2014-06-10 (×2): 5 mg via INTRAVENOUS
  Administered 2014-06-10: 30 mg via INTRAVENOUS

## 2014-06-10 MED ORDER — HYDROCODONE-ACETAMINOPHEN 10-325 MG PO TABS: 1.0000 | ORAL_TABLET | Freq: Four times a day (QID) | ORAL | Status: DC | PRN

## 2014-06-10 MED ORDER — MIDAZOLAM HCL 2 MG/2ML IJ SOLN
1.0000 mg | INTRAMUSCULAR | Status: DC | PRN
  Administered 2014-06-10: 2 mg via INTRAVENOUS

## 2014-06-10 MED ORDER — GLYCOPYRROLATE 0.2 MG/ML IJ SOLN
INTRAMUSCULAR | Status: DC | PRN
  Administered 2014-06-10: .5 mg via INTRAVENOUS

## 2014-06-10 MED ORDER — PHENYLEPHRINE HCL 10 MG/ML IJ SOLN
INTRAMUSCULAR | Status: AC
  Filled 2014-06-10: qty 1

## 2014-06-10 MED ORDER — CHLORHEXIDINE GLUCONATE 4 % EX LIQD: 60.0000 mL | Freq: Once | CUTANEOUS | Status: DC

## 2014-06-10 MED ORDER — LIDOCAINE HCL 1 % IJ SOLN
INTRAMUSCULAR | Status: DC | PRN
  Administered 2014-06-10: 40 mg via INTRADERMAL

## 2014-06-10 MED ORDER — ONDANSETRON HCL 4 MG/2ML IJ SOLN
4.0000 mg | Freq: Once | INTRAMUSCULAR | Status: DC | PRN
  Filled 2014-06-10: qty 2

## 2014-06-10 MED ORDER — MIDAZOLAM HCL 2 MG/2ML IJ SOLN
INTRAMUSCULAR | Status: AC
  Filled 2014-06-10: qty 2

## 2014-06-10 MED ORDER — SODIUM CHLORIDE 0.9 % IR SOLN
Status: DC | PRN
  Administered 2014-06-10 (×3): 500 mL

## 2014-06-10 MED ORDER — GLYCOPYRROLATE 0.2 MG/ML IJ SOLN
INTRAMUSCULAR | Status: AC
  Filled 2014-06-10: qty 2

## 2014-06-10 MED ORDER — SUCCINYLCHOLINE CHLORIDE 20 MG/ML IJ SOLN
INTRAMUSCULAR | Status: DC | PRN
  Administered 2014-06-10: 110 mg via INTRAVENOUS

## 2014-06-10 MED ORDER — FENTANYL CITRATE 0.05 MG/ML IJ SOLN
INTRAMUSCULAR | Status: DC | PRN
  Administered 2014-06-10 (×3): 50 ug via INTRAVENOUS
  Administered 2014-06-10: 100 ug via INTRAVENOUS
  Administered 2014-06-10 (×2): 25 ug via INTRAVENOUS
  Administered 2014-06-10: 50 ug via INTRAVENOUS

## 2014-06-10 MED ORDER — PREGABALIN 50 MG PO CAPS
50.0000 mg | ORAL_CAPSULE | Freq: Once | ORAL | Status: AC
  Administered 2014-06-10: 50 mg via ORAL
  Filled 2014-06-10: qty 1

## 2014-06-10 MED ORDER — BUPIVACAINE-EPINEPHRINE (PF) 0.5% -1:200000 IJ SOLN
INTRAMUSCULAR | Status: DC | PRN
  Administered 2014-06-10 (×2): 30 mL

## 2014-06-10 MED ORDER — PROPOFOL 10 MG/ML IV BOLUS
INTRAVENOUS | Status: DC | PRN
  Administered 2014-06-10: 120 mg via INTRAVENOUS

## 2014-06-10 MED ORDER — SODIUM CHLORIDE 0.9 % IJ SOLN
INTRAMUSCULAR | Status: AC
  Filled 2014-06-10: qty 10

## 2014-06-10 MED ORDER — OXYCODONE HCL 5 MG PO TABS
5.0000 mg | ORAL_TABLET | Freq: Once | ORAL | Status: AC
  Administered 2014-06-10: 5 mg via ORAL
  Filled 2014-06-10: qty 1

## 2014-06-10 MED ORDER — ROCURONIUM BROMIDE 50 MG/5ML IV SOLN
INTRAVENOUS | Status: AC
  Filled 2014-06-10: qty 1

## 2014-06-10 SURGICAL SUPPLY — 56 items
ANCHOR SUT CORKSCREW 5X15 (Anchor) ×6 IMPLANT
BIT DRILL 2.0MX128MM (BIT) ×3 IMPLANT
BLADE HEX COATED 2.75 (ELECTRODE) ×3 IMPLANT
BLADE OSC/SAGITTAL MD 9X18.5 (BLADE) ×3 IMPLANT
BUR FAST CUTTING (BURR) ×2
BUR SRG 54X4.7X12 FLUT (BURR) ×1 IMPLANT
BURR SRG 54X4.7X12 FLUT (BURR) ×1
CHLORAPREP W/TINT 26ML (MISCELLANEOUS) ×3 IMPLANT
CLOTH BEACON ORANGE TIMEOUT ST (SAFETY) ×3 IMPLANT
CONNECTOR PERFECT PASSER (CONNECTOR) ×3 IMPLANT
COVER LIGHT HANDLE STERIS (MISCELLANEOUS) ×6 IMPLANT
COVER PROBE W GEL 5X96 (DRAPES) ×3 IMPLANT
DRAPE PROXIMA HALF (DRAPES) ×3 IMPLANT
DRAPE UTILITY W/TAPE 26X15 (DRAPES) ×3 IMPLANT
DRESSING ALLEVYN BORDER 5X5 (GAUZE/BANDAGES/DRESSINGS) ×3 IMPLANT
ELECT REM PT RETURN 9FT ADLT (ELECTROSURGICAL) ×3
ELECTRODE REM PT RTRN 9FT ADLT (ELECTROSURGICAL) ×1 IMPLANT
GLOVE BIOGEL M 7.0 STRL (GLOVE) ×3 IMPLANT
GLOVE BIOGEL PI IND STRL 7.0 (GLOVE) ×2 IMPLANT
GLOVE BIOGEL PI INDICATOR 7.0 (GLOVE) ×4
GLOVE ECLIPSE 7.0 STRL STRAW (GLOVE) ×3 IMPLANT
GLOVE EXAM NITRILE LRG STRL (GLOVE) ×3 IMPLANT
GLOVE SKINSENSE NS SZ8.0 LF (GLOVE) ×2
GLOVE SKINSENSE STRL SZ8.0 LF (GLOVE) ×1 IMPLANT
GLOVE SS N UNI LF 8.5 STRL (GLOVE) ×3 IMPLANT
GOWN STRL REUS W/TWL LRG LVL3 (GOWN DISPOSABLE) ×6 IMPLANT
GOWN STRL REUS W/TWL XL LVL3 (GOWN DISPOSABLE) ×3 IMPLANT
GRAFT JACKET (Graft) ×3 IMPLANT
IMMOBILIZER SHOULDER MED (ORTHOPEDIC SUPPLIES) ×3 IMPLANT
INST SET MINOR BONE (KITS) ×3 IMPLANT
KIT BLADEGUARD II DBL (SET/KITS/TRAYS/PACK) ×3 IMPLANT
KIT ROOM TURNOVER APOR (KITS) ×3 IMPLANT
MANIFOLD NEPTUNE II (INSTRUMENTS) ×3 IMPLANT
MARKER SKIN DUAL TIP RULER LAB (MISCELLANEOUS) ×3 IMPLANT
NEEDLE HYPO 21X1.5 SAFETY (NEEDLE) ×3 IMPLANT
NEEDLE MA TROC 1/2 (NEEDLE) ×3 IMPLANT
NEEDLE MAYO 6 CRC TAPER PT (NEEDLE) ×3 IMPLANT
NS IRRIG 500ML POUR BTL (IV SOLUTION) ×9 IMPLANT
PACK TOTAL JOINT (CUSTOM PROCEDURE TRAY) ×3 IMPLANT
PAD ARMBOARD 7.5X6 YLW CONV (MISCELLANEOUS) ×3 IMPLANT
PUSHLOCK PEEK 4.5X24 (Orthopedic Implant) ×3 IMPLANT
RASP SM TEAR CROSS CUT (RASP) ×3 IMPLANT
SET BASIN LINEN APH (SET/KITS/TRAYS/PACK) ×3 IMPLANT
STAPLER VISISTAT 35W (STAPLE) ×3 IMPLANT
SUT ETHIBOND NAB OS 4 #2 30IN (SUTURE) ×9 IMPLANT
SUT FIBERWIRE #2 38 REV NDL BL (SUTURE) ×9
SUT MON AB 0 CT1 (SUTURE) ×6 IMPLANT
SUT PERFECTPASSER WHITE CART (SUTURE) ×3 IMPLANT
SUT VIC AB 2-0 CT1 27 (SUTURE) ×4
SUT VIC AB 2-0 CT1 TAPERPNT 27 (SUTURE) ×2 IMPLANT
SUTURE FIBERWR#2 38 REV NDL BL (SUTURE) ×3 IMPLANT
SYR 30ML LL (SYRINGE) ×3 IMPLANT
SYR BULB IRRIGATION 50ML (SYRINGE) ×6 IMPLANT
TAPE UMBILICAL COTTON 1/8X30 (MISCELLANEOUS) ×3 IMPLANT
YANKAUER SUCT 12FT TUBE ARGYLE (SUCTIONS) ×3 IMPLANT
YANKAUER SUCT BULB TIP NO VENT (SUCTIONS) ×3 IMPLANT

## 2014-06-10 NOTE — Interval H&P Note (Signed)
History and Physical Interval Note:  06/10/2014 10:17 AM  Beth Meza  has presented today for surgery, with the diagnosis of left rotator cuff tear  The various methods of treatment have been discussed with the patient and family. After consideration of risks, benefits and other options for treatment, the patient has consented to  Procedure(s): ROTATOR CUFF REPAIR SHOULDER OPEN (Left) as a surgical intervention .  The patient's history has been reviewed, patient examined, no change in status, stable for surgery.  I have reviewed the patient's chart and labs.  Questions were answered to the patient's satisfaction.     Fuller Canada

## 2014-06-10 NOTE — Anesthesia Postprocedure Evaluation (Signed)
  Anesthesia Post-op Note  Patient: Beth Meza  Procedure(s) Performed: Procedure(s): REVISION ROTATOR CUFF REPAIR SHOULDER OPEN WITH GRAFT (Left) SHOULDER ACROMIOPLASTY (Left)  Patient Location: PACU  Anesthesia Type:General  Level of Consciousness: awake, alert  and oriented  Airway and Oxygen Therapy: Patient Spontanous Breathing and Patient connected to face mask oxygen  Post-op Pain: moderate  Post-op Assessment: Post-op Vital signs reviewed, Patient's Cardiovascular Status Stable, Respiratory Function Stable, Patent Airway and No signs of Nausea or vomiting  Post-op Vital Signs: Reviewed and stable  Last Vitals:  Filed Vitals:   06/10/14 1035  BP: 100/69  Pulse:   Temp:   Resp: 21    Complications: No apparent anesthesia complications

## 2014-06-10 NOTE — Anesthesia Procedure Notes (Signed)
Procedure Name: Intubation Date/Time: 06/10/2014 10:55 AM Performed by: Glynn Octave E Pre-anesthesia Checklist: Patient identified, Patient being monitored, Timeout performed, Emergency Drugs available and Suction available Patient Re-evaluated:Patient Re-evaluated prior to inductionOxygen Delivery Method: Circle System Utilized Preoxygenation: Pre-oxygenation with 100% oxygen Intubation Type: IV induction, Rapid sequence and Cricoid Pressure applied Ventilation: Mask ventilation without difficulty Laryngoscope Size: Mac and 3 Grade View: Grade I Tube type: Oral Tube size: 7.0 mm Number of attempts: 1 Airway Equipment and Method: stylet Placement Confirmation: ETT inserted through vocal cords under direct vision,  positive ETCO2 and breath sounds checked- equal and bilateral Secured at: 21 cm Tube secured with: Tape Dental Injury: Teeth and Oropharynx as per pre-operative assessment

## 2014-06-10 NOTE — Op Note (Signed)
06/10/2014  1:02 PM  PATIENT:  Beth Meza  68 y.o. female  PRE-OPERATIVE DIAGNOSIS:  left rotator cuff tear  POST-OPERATIVE DIAGNOSIS:  left rotator cuff tear  PROCEDURE:  Procedure(s): REVISION ROTATOR CUFF REPAIR SHOULDER OPEN WITH GRAFT (Left) SHOULDER ACROMIOPLASTY (Left)  Implants include graft jacket lot number [id number]  Suture anchor Arthrex 5.0 mm corkscrew x 2 Push lock 4.5 x 24 mm Arthrex suture anchor Multiple FiberWire sutures using rib stop suture technique Operative findings was a delaminated tear the supraspinatus tendon with a rerupture of the previously repaired tendon. Most of the tear was in the anterior portion of the supraspinatus. Large anterior spur of the acromion. Large acromioclavicular spur.  Assisted by [employer]   Large acromial spur was noted. SURGEON:  Surgeon(s) and Role:    * Vickki Hearing, MD - Primary   ANESTHESIA:   general  EBL:  Total I/O In: 1000 [I.V.:1000] Out: 0   BLOOD ADMINISTERED:none  DRAINS: none   LOCAL MEDICATIONS USED:  MARCAINE     SPECIMEN:  No Specimen  DISPOSITION OF SPECIMEN:  N/A  COUNTS:  YES  TOURNIQUET:  * No tourniquets in log *  DICTATION: .Dragon Dictation  PLAN OF CARE: Discharge to home after PACU  PATIENT DISPOSITION:  PACU - hemodynamically stable.   Delay start of Pharmacological VTE agent (>24hrs) due to surgical blood loss or risk of bleeding: not applicable   Surgery was performed in the following manner The patient was identified in the preoperative holding area the left shoulder surgical site was marked She was taken to the operating room and given general anesthesia placed in modified beachchair position and given 2 g of Ancef IV chloro prep was used to perform prepping and sterile draping followed.  After the timeout was completed The previous incision was used to open the shoulder using a deltoid split at the anterior and middle thirds this was taken across the acromion  into the periosteal flaps were created. Acromioplasty was performed using a saw and a bur and a rasp.  Upon opening the deltoid joint fluid was expressed and this was removed with suction. The tear was an anterior portion supraspinatus tendon previous sutures were shown to be ruptured. Delamination was noted as well.  3 #2 fiber wire sutures were used to repair the delamination of the supraspinatus tendon. Interval slide was then used to release the supraspinatus from the coracoid. Superior and inferior dissection further releases supraspinatus tendon.  An inverted mattress suture was placed to control the supraspinatus tendon.  The greater tuberosity was decorticated with a bur.  We placed a #2 FiberWire in interrupted sutures as a rip stop suture technique. We placed 2 corkscrew anchors. This was followed by 1 lock anchor to incorporate the rib stop sutures into the proximal humerus and then the corkscrew anchors were placed and tied in simple fashion. The interval slide was repaired with #2 FiberWire. The graft jacket was then placed over the repair using multiple 2-0 Vicryl sutures  This gave an excellent watertight closure. Range of motion was normal. Irrigation of the wound was next completed and drill holes were placed in the acromion and #2 Ethibond suture was passed through the drill holes to repair the deltoid. The deltoid split was pared with #2 Ethibond as well.  Subcutaneous tissue was irrigated and closed with 0 Monocryl suture. Subcutaneous tissue was injected with Marcaine with epinephrine  Skin staples reapproximated the skin edges. Sterile dressing was applied. The patient was placed  in a shoulder immobilizer.

## 2014-06-10 NOTE — Brief Op Note (Signed)
06/10/2014  1:02 PM  PATIENT:  Beth Meza  68 y.o. female  PRE-OPERATIVE DIAGNOSIS:  left rotator cuff tear  POST-OPERATIVE DIAGNOSIS:  left rotator cuff tear  PROCEDURE:  Procedure(s): REVISION ROTATOR CUFF REPAIR SHOULDER OPEN WITH GRAFT (Left) SHOULDER ACROMIOPLASTY (Left)  Implants include graft jacket lot number [id number]  Suture anchor Arthrex 5.0 mm corkscrew x 2 Push lock 4.5 x 24 mm Arthrex suture anchor Multiple FiberWire sutures using rib stop suture technique Operative findings was a delaminated tear the supraspinatus tendon with a rerupture of the previously repaired tendon. Most of the tear was in the anterior portion of the supraspinatus. Large anterior spur of the acromion. Large acromioclavicular spur.  Assisted by [employer]   Large acromial spur was noted. SURGEON:  Surgeon(s) and Role:    * Vickki Hearing, MD - Primary   ANESTHESIA:   general  EBL:  Total I/O In: 1000 [I.V.:1000] Out: 0   BLOOD ADMINISTERED:none  DRAINS: none   LOCAL MEDICATIONS USED:  MARCAINE     SPECIMEN:  No Specimen  DISPOSITION OF SPECIMEN:  N/A  COUNTS:  YES  TOURNIQUET:  * No tourniquets in log *  DICTATION: .Dragon Dictation  PLAN OF CARE: Discharge to home after PACU  PATIENT DISPOSITION:  PACU - hemodynamically stable.   Delay start of Pharmacological VTE agent (>24hrs) due to surgical blood loss or risk of bleeding: not applicable   Surgery was performed in the following manner The patient was identified in the preoperative holding area the left shoulder surgical site was marked She was taken to the operating room and given general anesthesia placed in modified beachchair position and given 2 g of Ancef IV chloro prep was used to perform prepping and sterile draping followed.  After the timeout was completed The previous incision was used to open the shoulder using a deltoid split at the anterior and middle thirds this was taken across the acromion  into the periosteal flaps were created. Acromioplasty was performed using a saw and a bur and a rasp.  Upon opening the deltoid joint fluid was expressed and this was removed with suction. The tear was an anterior portion supraspinatus tendon previous sutures were shown to be ruptured. Delamination was noted as well.  3 #2 fiber wire sutures were used to repair the delamination of the supraspinatus tendon. Interval slide was then used to release the supraspinatus from the coracoid. Superior and inferior dissection further releases supraspinatus tendon.  An inverted mattress suture was placed to control the supraspinatus tendon.  The greater tuberosity was decorticated with a bur.  We placed a #2 FiberWire in interrupted sutures as a rip stop suture technique. We placed 2 corkscrew anchors. This was followed by 1 lock anchor to incorporate the rib stop sutures into the proximal humerus and then the corkscrew anchors were placed and tied in simple fashion. The interval slide was repaired with #2 FiberWire. The graft jacket was then placed over the repair using multiple 2-0 Vicryl sutures  This gave an excellent watertight closure. Range of motion was normal. Irrigation of the wound was next completed and drill holes were placed in the acromion and #2 Ethibond suture was passed through the drill holes to repair the deltoid. The deltoid split was pared with #2 Ethibond as well.  Subcutaneous tissue was irrigated and closed with 0 Monocryl suture. Subcutaneous tissue was injected with Marcaine with epinephrine  Skin staples reapproximated the skin edges. Sterile dressing was applied. The patient was placed  in a shoulder immobilizer.   

## 2014-06-10 NOTE — Anesthesia Preprocedure Evaluation (Addendum)
Anesthesia Evaluation  Patient identified by MRN, date of birth, ID band Patient awake    Reviewed: Allergy & Precautions, H&P , NPO status , Patient's Chart, lab work & pertinent test results  Airway Mallampati: II TM Distance: >3 FB Neck ROM: Full    Dental  (+) Teeth Intact   Pulmonary neg pulmonary ROS,  breath sounds clear to auscultation        Cardiovascular hypertension, Pt. on medications Rhythm:Regular Rate:Normal     Neuro/Psych  Headaches, Seizures -,     GI/Hepatic GERD-  Controlled and Medicated,  Endo/Other  diabetes, Type 2  Renal/GU      Musculoskeletal   Abdominal   Peds  Hematology   Anesthesia Other Findings   Reproductive/Obstetrics                          Anesthesia Physical Anesthesia Plan  ASA: III  Anesthesia Plan: General   Post-op Pain Management:    Induction: Intravenous, Rapid sequence and Cricoid pressure planned  Airway Management Planned: Oral ETT  Additional Equipment:   Intra-op Plan:   Post-operative Plan: Extubation in OR  Informed Consent: I have reviewed the patients History and Physical, chart, labs and discussed the procedure including the risks, benefits and alternatives for the proposed anesthesia with the patient or authorized representative who has indicated his/her understanding and acceptance.     Plan Discussed with:   Anesthesia Plan Comments: (1/2 amp D50 for CBG=74 in preop.)       Anesthesia Quick Evaluation

## 2014-06-10 NOTE — Transfer of Care (Signed)
Immediate Anesthesia Transfer of Care Note  Patient: Beth Meza  Procedure(s) Performed: Procedure(s): REVISION ROTATOR CUFF REPAIR SHOULDER OPEN WITH GRAFT (Left) SHOULDER ACROMIOPLASTY (Left)  Patient Location: PACU  Anesthesia Type:General  Level of Consciousness: awake and alert   Airway & Oxygen Therapy: Patient Spontanous Breathing and Patient connected to face mask oxygen  Post-op Assessment: Report given to PACU RN  Post vital signs: Reviewed and stable  Complications: No apparent anesthesia complications

## 2014-06-11 ENCOUNTER — Encounter (HOSPITAL_COMMUNITY): Payer: Self-pay | Admitting: Orthopedic Surgery

## 2014-06-16 ENCOUNTER — Ambulatory Visit (INDEPENDENT_AMBULATORY_CARE_PROVIDER_SITE_OTHER): Payer: Medicare Other | Admitting: Orthopedic Surgery

## 2014-06-16 VITALS — BP 97/58 | Ht 67.5 in | Wt 165.0 lb

## 2014-06-16 DIAGNOSIS — M75102 Unspecified rotator cuff tear or rupture of left shoulder, not specified as traumatic: Secondary | ICD-10-CM

## 2014-06-16 DIAGNOSIS — Z9889 Other specified postprocedural states: Secondary | ICD-10-CM

## 2014-06-16 DIAGNOSIS — S43429A Sprain of unspecified rotator cuff capsule, initial encounter: Secondary | ICD-10-CM

## 2014-06-16 NOTE — Progress Notes (Signed)
Chief Complaint  Patient presents with  . Follow-up    post op #1, Left shoulder revision RCR, DOS 06/10/14    Revision cuff repair postop day 6 doing well patient having minimal symptoms at this point. Continue brace. Return for staple removal next week

## 2014-06-23 ENCOUNTER — Ambulatory Visit (INDEPENDENT_AMBULATORY_CARE_PROVIDER_SITE_OTHER): Payer: Medicare Other | Admitting: Orthopedic Surgery

## 2014-06-23 VITALS — BP 103/57 | Ht 67.5 in | Wt 165.0 lb

## 2014-06-23 DIAGNOSIS — Z9889 Other specified postprocedural states: Secondary | ICD-10-CM

## 2014-06-23 DIAGNOSIS — S43429A Sprain of unspecified rotator cuff capsule, initial encounter: Secondary | ICD-10-CM

## 2014-06-23 DIAGNOSIS — M75102 Unspecified rotator cuff tear or rupture of left shoulder, not specified as traumatic: Secondary | ICD-10-CM

## 2014-06-23 NOTE — Progress Notes (Signed)
Chief Complaint  Patient presents with  . Follow-up    post op 2, staple removal, LEFT RCR REVISION, DOS 06/10/14    Revision left rotator cuff repair  Patient doing well taking one pain pill in the morning one in the afternoon  Recommend continued rest in the sling come back in 2 weeks for me to recheck the incision just see how things are going and we can start rehabilitation.  PRE-OPERATIVE DIAGNOSIS:  left rotator cuff tear  POST-OPERATIVE DIAGNOSIS:  left rotator cuff tear  PROCEDURE:  Procedure(s): REVISION ROTATOR CUFF REPAIR SHOULDER OPEN WITH GRAFT (Left) SHOULDER ACROMIOPLASTY (Left)  Implants include graft jacket lot number [id number]  Suture anchor Arthrex 5.0 mm corkscrew x 2 Push lock 4.5 x 24 mm Arthrex suture anchor Multiple FiberWire sutures using rib stop suture technique Operative findings was a delaminated tear the supraspinatus tendon with a rerupture of the previously repaired tendon. Most of the tear was in the anterior portion of the supraspinatus. Large anterior spur of the acromion. Large acromioclavicular spur.

## 2014-07-07 ENCOUNTER — Ambulatory Visit (INDEPENDENT_AMBULATORY_CARE_PROVIDER_SITE_OTHER): Payer: Medicare Other | Admitting: Orthopedic Surgery

## 2014-07-07 VITALS — BP 82/44 | Ht 67.5 in | Wt 165.0 lb

## 2014-07-07 DIAGNOSIS — M75102 Unspecified rotator cuff tear or rupture of left shoulder, not specified as traumatic: Secondary | ICD-10-CM

## 2014-07-07 DIAGNOSIS — Z9889 Other specified postprocedural states: Secondary | ICD-10-CM

## 2014-07-07 DIAGNOSIS — S43429A Sprain of unspecified rotator cuff capsule, initial encounter: Secondary | ICD-10-CM

## 2014-07-07 MED ORDER — HYDROCODONE-ACETAMINOPHEN 10-325 MG PO TABS: 1.0000 | ORAL_TABLET | Freq: Four times a day (QID) | ORAL | Status: DC | PRN

## 2014-07-07 NOTE — Patient Instructions (Signed)
[facility] at [facility] [place]

## 2014-07-07 NOTE — Progress Notes (Signed)
Chief Complaint  Patient presents with  . Follow-up    3 week recheck Lt RCR revision, DOS 06/10/14    This is a 4 week postop visit from a rotator cuff repair revision with a graft.  She's doing well with minimal complaints of pain takes one 10 mg hydrocodone tablets twice a day. She is comfortable in her sling shot. Her wound looks good  Swelling is down. She has some soreness in the upper forearm which is relieved by extending the elbow.  Recommend therapy to be started next week in follow up in 6 weeks

## 2014-07-20 ENCOUNTER — Ambulatory Visit: Payer: Medicare Other | Attending: Orthopedic Surgery

## 2014-07-20 DIAGNOSIS — M6530 Trigger finger, unspecified finger: Secondary | ICD-10-CM | POA: Diagnosis not present

## 2014-07-20 DIAGNOSIS — M25629 Stiffness of unspecified elbow, not elsewhere classified: Secondary | ICD-10-CM | POA: Insufficient documentation

## 2014-07-20 DIAGNOSIS — M75100 Unspecified rotator cuff tear or rupture of unspecified shoulder, not specified as traumatic: Secondary | ICD-10-CM | POA: Diagnosis present

## 2014-07-20 DIAGNOSIS — M75102 Unspecified rotator cuff tear or rupture of left shoulder, not specified as traumatic: Secondary | ICD-10-CM | POA: Diagnosis not present

## 2014-07-20 DIAGNOSIS — R293 Abnormal posture: Secondary | ICD-10-CM | POA: Diagnosis not present

## 2014-07-20 DIAGNOSIS — M25519 Pain in unspecified shoulder: Secondary | ICD-10-CM | POA: Diagnosis not present

## 2014-07-22 ENCOUNTER — Ambulatory Visit: Payer: Medicare Other | Admitting: Physical Therapy

## 2014-07-22 DIAGNOSIS — M75102 Unspecified rotator cuff tear or rupture of left shoulder, not specified as traumatic: Secondary | ICD-10-CM | POA: Diagnosis not present

## 2014-07-28 ENCOUNTER — Ambulatory Visit: Payer: Medicare Other | Admitting: Physical Therapy

## 2014-07-28 DIAGNOSIS — M75102 Unspecified rotator cuff tear or rupture of left shoulder, not specified as traumatic: Secondary | ICD-10-CM | POA: Diagnosis not present

## 2014-07-29 ENCOUNTER — Other Ambulatory Visit: Payer: Self-pay | Admitting: Neurology

## 2014-07-30 ENCOUNTER — Ambulatory Visit: Payer: Medicare Other

## 2014-07-30 DIAGNOSIS — M75102 Unspecified rotator cuff tear or rupture of left shoulder, not specified as traumatic: Secondary | ICD-10-CM | POA: Diagnosis not present

## 2014-08-03 ENCOUNTER — Telehealth: Payer: Self-pay | Admitting: Orthopedic Surgery

## 2014-08-03 ENCOUNTER — Other Ambulatory Visit: Payer: Self-pay | Admitting: *Deleted

## 2014-08-03 DIAGNOSIS — Z9889 Other specified postprocedural states: Secondary | ICD-10-CM

## 2014-08-03 DIAGNOSIS — M75102 Unspecified rotator cuff tear or rupture of left shoulder, not specified as traumatic: Secondary | ICD-10-CM

## 2014-08-03 MED ORDER — HYDROCODONE-ACETAMINOPHEN 10-325 MG PO TABS: 1.0000 | ORAL_TABLET | Freq: Four times a day (QID) | ORAL | Status: DC | PRN

## 2014-08-03 NOTE — Telephone Encounter (Signed)
Patient called to request refill on her pain medication: HYDROcodone-acetaminophen (NORCO) 10-325 MG per tablet [[id number] Please call her on her Cell# [phone].

## 2014-08-03 NOTE — Telephone Encounter (Signed)
Prescription available for pick up, called patient, no answer 

## 2014-08-04 ENCOUNTER — Telehealth: Payer: Self-pay | Admitting: Orthopedic Surgery

## 2014-08-04 ENCOUNTER — Ambulatory Visit: Payer: Medicare Other | Admitting: Physical Therapy

## 2014-08-04 ENCOUNTER — Other Ambulatory Visit: Payer: Self-pay | Admitting: *Deleted

## 2014-08-04 DIAGNOSIS — M75102 Unspecified rotator cuff tear or rupture of left shoulder, not specified as traumatic: Secondary | ICD-10-CM | POA: Diagnosis not present

## 2014-08-04 MED ORDER — CYCLOBENZAPRINE HCL 5 MG PO TABS: 5.0000 mg | ORAL_TABLET | Freq: Three times a day (TID) | ORAL | Status: DC | PRN

## 2014-08-04 NOTE — Telephone Encounter (Signed)
Refill refilled as requested

## 2014-08-04 NOTE — Telephone Encounter (Signed)
Patient was made aware that her pain medication refill, Hydrocodone/norco 10-325 is ready for pick up.  She is asking also for Dr Romeo AppleHarrison to also write out a prescription refill for Clobentaz.5 mg.  She would like to have these filled at [facility] in [place].   I've explained that typically patients may call the pharmacy and request a refill for medications other than narcotic pain medication, and pharmacy can fax the request --  but this may be a different pharmacy. Please advise patient.  Her cell ph# is [phone]

## 2014-08-06 NOTE — Telephone Encounter (Signed)
08/05/14 patient picked up prescriptions.

## 2014-08-11 ENCOUNTER — Ambulatory Visit: Payer: Medicare Other | Attending: Orthopedic Surgery

## 2014-08-11 DIAGNOSIS — M25519 Pain in unspecified shoulder: Secondary | ICD-10-CM | POA: Insufficient documentation

## 2014-08-11 DIAGNOSIS — M6530 Trigger finger, unspecified finger: Secondary | ICD-10-CM | POA: Insufficient documentation

## 2014-08-11 DIAGNOSIS — M25512 Pain in left shoulder: Secondary | ICD-10-CM

## 2014-08-11 DIAGNOSIS — M25629 Stiffness of unspecified elbow, not elsewhere classified: Secondary | ICD-10-CM | POA: Insufficient documentation

## 2014-08-11 DIAGNOSIS — M75102 Unspecified rotator cuff tear or rupture of left shoulder, not specified as traumatic: Secondary | ICD-10-CM | POA: Insufficient documentation

## 2014-08-11 DIAGNOSIS — R293 Abnormal posture: Secondary | ICD-10-CM | POA: Insufficient documentation

## 2014-08-11 DIAGNOSIS — S43422A Sprain of left rotator cuff capsule, initial encounter: Secondary | ICD-10-CM

## 2014-08-11 DIAGNOSIS — M75100 Unspecified rotator cuff tear or rupture of unspecified shoulder, not specified as traumatic: Secondary | ICD-10-CM | POA: Diagnosis present

## 2014-08-11 NOTE — Patient Instructions (Signed)
Plan to write MD note next visit for follow- up appt on 08/18/14 with Dr Romeo AppleHarrison.   Continue to avoid using L UE with ADLs.   Wear sling in public/ community to protect shoulder.

## 2014-08-11 NOTE — Telephone Encounter (Signed)
There was no appt available for next week . Gave 2 appts for the week of 08/24/14....td

## 2014-08-11 NOTE — Therapy (Signed)
Physical Therapy Treatment  Patient Details  Name: Beth Meza MRN: [id number] Date of Birth: 05/16/1946  Encounter Date: 08/11/2014      PT End of Session - 08/11/14 1524    Visit Number 6   Number of Visits 24   Date for PT Re-Evaluation 10/13/13   PT Start Time 1445   PT Stop Time 1535   PT Time Calculation (min) 50 min   Activity Tolerance Patient tolerated treatment well;Other (comment)  No increase in pain following treatment or adverse effects.       Past Medical History  Diagnosis Date  . Back spasm   . Hand cramps   . Leg cramps   . Dizziness and giddiness 03/07/2013  . Headache(784.0) 03/07/2013  . Gastroesophageal reflux disease   . Degenerative arthritis   . Hypertension   . Dyslipidemia   . History of carpal tunnel syndrome     Bilateral  . Diabetes     diet controlled  . Memory deficit 02/25/2014  . Seizures     as child- unknown etiology-no meds  . Anemia     Past Surgical History  Procedure Laterality Date  . Back surgery    . Knee surgery Left     Pin, history of fracture  . Ankle surgery Left     History of ankle fracture  . Hand surgery      Carpal tunnel syndrome, trigger fingers bilaterally  . Shoulder open rotator cuff repair Left 04/08/2014    Dr. Romeo AppleHarrison  . Colonoscopy N/A 05/04/2014    Procedure: COLONOSCOPY;  Surgeon: West BaliSandi L Fields, MD;  Location: [facility];  Service: Endoscopy;  Laterality: N/A;  11:30  . Shoulder open rotator cuff repair Left 06/10/2014    Procedure: REVISION ROTATOR CUFF REPAIR SHOULDER OPEN WITH GRAFT;  Surgeon: Vickki HearingStanley E Harrison, MD;  Location: [facility];  Service: Orthopedics;  Laterality: Left;  . Shoulder acromioplasty Left 06/10/2014    Procedure: SHOULDER ACROMIOPLASTY;  Surgeon: Vickki HearingStanley E Harrison, MD;  Location: [facility];  Service: Orthopedics;  Laterality: Left;    There were no vitals taken for this visit.  Visit Diagnosis:  Pain in joint, shoulder region, left  Rotator cuff (capsule) sprain, left,  initial encounter          Adult PT Treatment/Exercise - 08/11/14 0700    Exercises   Exercises Shoulder   Shoulder Exercises: Supine   External Rotation PROM   Internal Rotation PROM   Flexion PROM   ABduction PROM   Shoulder Exercises: ROM/Strengthening   Other ROM/Strengthening Exercises PROM ONLY.    Other ROM/Strengthening Exercises Elbow flexion 10 x 3 , 1 lbs     Manual therapy including soft tissue massage to L anterior shoulder, biceps, and deltoid region with decreased pain. Gentle joint distraction during PROM.   Ended with ice 10 mins to L shoulder, pt supine.        Education - 08/11/14 1524    Education provided Yes   Education Details see pt instructions    Education Details Patient   Methods Explanation;Demonstration;Verbal cues   Comprehension Verbalized understanding          PT Short Term Goals - 08/11/14 1440    PT SHORT TERM GOAL #1   Title independent with initial HEP   Status On-going   PT SHORT TERM GOAL #2   Title report pain decrease 25% or more to improve active L Shoulder overhead to 100 degrees when allowed in protocol.  Status On-going   PT SHORT TERM GOAL #3   Title report increased use of L arm for home tasks/ feeding/ self care by 25% or more    Status On-going   PT SHORT TERM GOAL #4   Title no sling with ADLs   Status On-going          PT Long Term Goals - 08/11/14 1443    PT LONG TERM GOAL #1   Title Demonstrate and/or verbalize techniques to reduce the risk of re-injury to include info on: anti-inflammatory ( RICE method).   Status On-going   PT LONG TERM GOAL #2   Title be indep with advanced HEP   Status On-going   PT LONG TERM GOAL #3   Title report pain decreased 75% or more to return to normal home tasks   Status On-going   PT LONG TERM GOAL #4   Title Improve active range to [facility] to allow independence with self care and light to moderate home tasks   Status On-going   PT LONG TERM GOAL #5   Title returns  to driving          Plan - 16/07/9610/03/15 1457    Clinical Impression Statement PROM only per MD orders. Minimal pain. Progressing well.    Pt will benefit from skilled therapeutic intervention in order to improve on the following deficits Decreased mobility;Decreased range of motion;Decreased strength;Impaired UE functional use;Pain   Rehab Potential Excellent   Clinical Impairments Affecting Rehab Potential PROM only per MD orders    PT Treatment/Interventions Therapeutic exercise;Other (comment)  PROM ONLY    PT Plan ---PROM ONLY---- One more visit on Thursday, then sees Dr Romeo AppleHarrison for follow-up on next Tues. Write MD note for MD appt. Progress per MD orders after visit on 08/18/14.         Problem List Patient Active Problem List   Diagnosis Date Noted  . Unspecified constipation 04/30/2014  . Rotator cuff (capsule) sprain 04/13/2014  . Rotator cuff tear, left 02/26/2014  . Memory deficit 02/25/2014  . Dizziness and giddiness 03/07/2013  . Headache(784.0) 03/07/2013                                            Haze Rushingenzi, Creg Gilmer 08/11/2014, 3:45 PM

## 2014-08-13 ENCOUNTER — Ambulatory Visit: Payer: Medicare Other

## 2014-08-13 DIAGNOSIS — S43422D Sprain of left rotator cuff capsule, subsequent encounter: Secondary | ICD-10-CM

## 2014-08-13 DIAGNOSIS — M25512 Pain in left shoulder: Secondary | ICD-10-CM

## 2014-08-13 DIAGNOSIS — M75102 Unspecified rotator cuff tear or rupture of left shoulder, not specified as traumatic: Secondary | ICD-10-CM | POA: Diagnosis not present

## 2014-08-13 NOTE — Therapy (Signed)
Physical Therapy Treatment  Patient Details  Name: Beth Meza MRN: [id number] Date of Birth: 1946/03/13  Encounter Date: 08/13/2014      PT End of Session - 08/13/14 1518    Visit Number 7   Number of Visits 24   Date for PT Re-Evaluation 10/13/13   PT Start Time 1425   PT Stop Time 1500   PT Time Calculation (min) 35 min   Activity Tolerance Patient tolerated treatment well      Past Medical History  Diagnosis Date  . Back spasm   . Hand cramps   . Leg cramps   . Dizziness and giddiness 03/07/2013  . Headache(784.0) 03/07/2013  . Gastroesophageal reflux disease   . Degenerative arthritis   . Hypertension   . Dyslipidemia   . History of carpal tunnel syndrome     Bilateral  . Diabetes     diet controlled  . Memory deficit 02/25/2014  . Seizures     as child- unknown etiology-no meds  . Anemia     Past Surgical History  Procedure Laterality Date  . Back surgery    . Knee surgery Left     Pin, history of fracture  . Ankle surgery Left     History of ankle fracture  . Hand surgery      Carpal tunnel syndrome, trigger fingers bilaterally  . Shoulder open rotator cuff repair Left 04/08/2014    Dr. Romeo AppleHarrison  . Colonoscopy N/A 05/04/2014    Procedure: COLONOSCOPY;  Surgeon: West BaliSandi L Fields, MD;  Location: [facility];  Service: Endoscopy;  Laterality: N/A;  11:30  . Shoulder open rotator cuff repair Left 06/10/2014    Procedure: REVISION ROTATOR CUFF REPAIR SHOULDER OPEN WITH GRAFT;  Surgeon: Vickki HearingStanley E Harrison, MD;  Location: [facility];  Service: Orthopedics;  Laterality: Left;  . Shoulder acromioplasty Left 06/10/2014    Procedure: SHOULDER ACROMIOPLASTY;  Surgeon: Vickki HearingStanley E Harrison, MD;  Location: [facility];  Service: Orthopedics;  Laterality: Left;    There were no vitals taken for this visit.  Visit Diagnosis:  Left shoulder pain  Rotator cuff (capsule) sprain, left, subsequent encounter        [facility] PT Assessment - 08/13/14 1514    PROM   Left Shoulder  Flexion 120 Degrees   Left Shoulder ABduction 90 Degrees   Left Shoulder Internal Rotation 65 Degrees  at neutral   Left Shoulder External Rotation 45 Degrees  45 degrees ER at 45 degrees ABD, 35 degrees ER at neutral          [facility] Adult PT Treatment/Exercise - 08/13/14 1512    Exercises   Exercises Shoulder   Shoulder Exercises: Supine   External Rotation PROM   Internal Rotation PROM   Flexion PROM   ABduction PROM   Manual Therapy   Manual Therapy Other (comment)   Manual Therapy   Other Manual Therapy Soft tissue mobilization to Anterior shoulder, deltoid, biceps.           Education - 08/13/14 1517    Education provided Yes   Education Details Continue to wear sling in public or if in pain.  Do not actively use arm. Follow up with MD next week. MD note will be sent.    Education Details Patient   Methods Explanation;Verbal cues   Comprehension Verbalized understanding              Plan - 08/13/14 1519    Clinical Impression Statement Re-assessed PROM measures for  MD note. Improvements in PROM made, but restricted due to end range pain.    Pt will benefit from skilled therapeutic intervention in order to improve on the following deficits Decreased mobility;Decreased strength;Decreased range of motion;Pain   Rehab Potential Excellent   Clinical Impairments Affecting Rehab Potential PROM only per MD orders    PT Treatment/Interventions Manual techniques;Neuromuscular re-education;Modalities;Therapeutic activities;Patient/family education;Therapeutic exercise   PT Plan ---PROM ONLY---- Sees Dr Romeo AppleHarrison for follow-up on next Tues.  Progress per MD orders after MD visit on 08/18/14.         Problem List Patient Active Problem List   Diagnosis Date Noted  . Unspecified constipation 04/30/2014  . Rotator cuff (capsule) sprain 04/13/2014  . Rotator cuff tear, left 02/26/2014  . Memory deficit 02/25/2014  . Dizziness and giddiness 03/07/2013  .  Headache(784.0) 03/07/2013        DR Marney SettingHARRISON,  PLEASE FAX [phone]) NEW ORDERS/ RECOMMENDATIONS/ RESTRICTIONS FOR PROGRESSION OF THERAPY: (ie. PROM>AAROM>AROM).  THANK YOU FOR YOUR REFERRAL.       Beth RushingRenzi, Beth Meza 08/13/2014, 3:28 PM

## 2014-08-18 ENCOUNTER — Ambulatory Visit (INDEPENDENT_AMBULATORY_CARE_PROVIDER_SITE_OTHER): Payer: Medicare Other | Admitting: Orthopedic Surgery

## 2014-08-18 ENCOUNTER — Encounter: Payer: Self-pay | Admitting: Orthopedic Surgery

## 2014-08-18 VITALS — BP 114/70 | Ht 67.5 in | Wt 165.0 lb

## 2014-08-18 DIAGNOSIS — Z9889 Other specified postprocedural states: Secondary | ICD-10-CM

## 2014-08-18 NOTE — Progress Notes (Signed)
Patient ID: [name], female   DOB: Mar 08, 1946, 68 y.o.   MRN: [id number] Chief Complaint  Patient presents with  . Follow-up    6 week recheck on left shoulder RCR, S/P therapy.DOS 06-10-14.    Cuff repair done about 6 weeks ago she is doing well minimal pain takes her pain medicine 1 tablet in the morning one at night. She's been on the passive range of motion program for 6 weeks we can advance to progressive resistance exercises and active assisted range of motion  Return 6 weeks

## 2014-08-18 NOTE — Patient Instructions (Signed)
Advance therapy as tolerated  

## 2014-08-21 ENCOUNTER — Encounter: Payer: Self-pay | Admitting: *Deleted

## 2014-08-25 ENCOUNTER — Telehealth: Payer: Self-pay | Admitting: *Deleted

## 2014-08-25 ENCOUNTER — Ambulatory Visit: Payer: Medicare Other | Admitting: Rehabilitation

## 2014-08-25 DIAGNOSIS — S43422A Sprain of left rotator cuff capsule, initial encounter: Secondary | ICD-10-CM

## 2014-08-25 DIAGNOSIS — M75102 Unspecified rotator cuff tear or rupture of left shoulder, not specified as traumatic: Secondary | ICD-10-CM | POA: Diagnosis not present

## 2014-08-25 DIAGNOSIS — M25512 Pain in left shoulder: Secondary | ICD-10-CM

## 2014-08-25 DIAGNOSIS — S43422D Sprain of left rotator cuff capsule, subsequent encounter: Secondary | ICD-10-CM

## 2014-08-25 NOTE — Telephone Encounter (Signed)
appts made and printed...td 

## 2014-08-25 NOTE — Therapy (Signed)
Physical Therapy Treatment  Patient Details  Name: Beth Meza MRN: [id number] Date of Birth: [birth year]  Encounter Date: 08/25/2014      PT End of Session - 08/25/14 1517    Visit Number 8   Number of Visits 24   Date for PT Re-Evaluation 10/13/13   PT Start Time 0305   PT Stop Time 0345   PT Time Calculation (min) 40 min      Past Medical History  Diagnosis Date  . Back spasm   . Hand cramps   . Leg cramps   . Dizziness and giddiness 03/07/2013  . Headache(784.0) 03/07/2013  . Gastroesophageal reflux disease   . Degenerative arthritis   . Hypertension   . Dyslipidemia   . History of carpal tunnel syndrome     Bilateral  . Diabetes     diet controlled  . Memory deficit 02/25/2014  . Seizures     as child- unknown etiology-no meds  . Anemia     Past Surgical History  Procedure Laterality Date  . Back surgery    . Knee surgery Left     Pin, history of fracture  . Ankle surgery Left     History of ankle fracture  . Hand surgery      Carpal tunnel syndrome, trigger fingers bilaterally  . Shoulder open rotator cuff repair Left 04/08/2014    Dr. Romeo AppleHarrison  . Colonoscopy N/A 05/04/2014    Procedure: COLONOSCOPY;  Surgeon: West BaliSandi L Fields, MD;  [facility];  Service: Endoscopy;  Laterality: N/A;  11:30  . Shoulder open rotator cuff repair Left 06/10/2014    Procedure: REVISION ROTATOR CUFF REPAIR SHOULDER OPEN WITH GRAFT;  Surgeon: Vickki HearingStanley E Harrison, MD;  Location: [facility];  Service: Orthopedics;  Laterality: Left;  . Shoulder acromioplasty Left 06/10/2014    Procedure: SHOULDER ACROMIOPLASTY;  Surgeon: Vickki HearingStanley E Harrison, MD;  Location: [facility];  Service: Orthopedics;  Laterality: Left;    There were no vitals taken for this visit.  Visit Diagnosis:  Rotator cuff (capsule) sprain, left, subsequent encounter  Pain in joint, shoulder region, left  Rotator cuff (capsule) sprain, left, initial encounter  Left shoulder pain      Subjective Assessment -  08/25/14 1510    Currently in Pain? Yes   Pain Score 3    Pain Location Shoulder   Pain Orientation Left   Pain Descriptors / Indicators --  twinge   Pain Type Surgical pain   Pain Onset More than a month ago   Pain Frequency Intermittent   Aggravating Factors  stretch   Pain Relieving Factors Rest   Multiple Pain Sites No          [facility] PT Assessment - 08/25/14 1519    AROM   Left Shoulder Extension 55 Degrees   Left Shoulder Flexion 85 Degrees   Left Shoulder ABduction 65 Degrees   Left Shoulder Internal Rotation --  reach to lumbar   Left Shoulder External Rotation --  reach to t2          [facility]Ex[facility] - 08/25/14 1514    Shoulder Exercises: Supine   Other Supine Exercises --  Supine Cane exercises   Shoulder Exercises: Standing   Other Standing Exercises --  isometrics x10 each way using towel          PT Education - 08/25/14 1517    Education provided Yes   Person(s) Educated Patient   Methods Handout   Comprehension  Verbalized understanding              Plan - 08/25/14 1518    Clinical Impression Statement Per pt progress report with MD, okay to advance to AROM and progressive resistive strengthening. , no increased pain after todays treatment   PT Plan Continue AAROM, AROM        Problem List Patient Active Problem List   Diagnosis Date Noted  . Unspecified constipation 04/30/2014  . Rotator cuff (capsule) sprain 04/13/2014  . Rotator cuff tear, left 02/26/2014  . Memory deficit 02/25/2014  . Dizziness and giddiness 03/07/2013  . Headache(784.0) 03/07/2013                                              Beth Spanneronoho,  Meza,PTA 08/25/2014, 4:03 PM

## 2014-08-25 NOTE — Patient Instructions (Addendum)
Cane Exercise: Flexion   Lie on back, holding cane above chest. Keeping arms as straight as possible, lower cane toward floor beyond head. Hold ____ seconds. Repeat __10-20__ times. Do __2__ sessions per day.  http://gt2.exer.us/91   Copyright  VHI. All rights reserved.  SOULDER: External Rotation - Supine (Cane)   Hold cane with both hands. Rotate arm away from body. Keep elbow on floor and next to body. _10-20__ reps per set, _2_ sets per day, __7_ days per week Add towel to keep elbow at side.  Copyright  VHI. All rights reserved.  Cane Horizontal - Supine   With straight arms holding cane above shoulders, bring cane out to right, center, out to left, and back to above head. Repeat __10-20_ times. Do 2___ times per day.  Copyright  VHI. All rights reserved.   Strengthening: Isometric Flexion  Using wall for resistance, press right fist into ball using light pressure. Hold ___5_ seconds. Repeat __10__ times per set. Do __1-2__ sets per session. Do __2_ sessions per day.  SHOULDER: Abduction (Isometric)  Use wall as resistance. Press arm against pillow. Keep elbow straight. Hold __5_ seconds. __10_ reps per set, _1-2__ sets per day, __2_ days per week  Extension (Isometric)  Place left bent elbow and back of arm against wall. Press elbow against wall. Hold _5___ seconds. Repeat __10__ times. Do __2__ sessions per day.  Internal Rotation (Isometric)  Place palm of right fist against door frame, with elbow bent. Press fist against door frame. Hold __5__ seconds. Repeat _10___ times. Do ___2_ sessions per day.  External Rotation (Isometric)  Place back of left fist against door frame, with elbow bent. Press fist against door frame. Hold __5__ seconds. Repeat _10___ times. Do __-2_ sessions per day.  Copyright  VHI. All rights reserved.

## 2014-08-26 ENCOUNTER — Ambulatory Visit: Payer: Medicare Other

## 2014-08-26 ENCOUNTER — Encounter: Payer: Self-pay | Admitting: Neurology

## 2014-08-26 DIAGNOSIS — S43422D Sprain of left rotator cuff capsule, subsequent encounter: Secondary | ICD-10-CM

## 2014-08-26 DIAGNOSIS — M75102 Unspecified rotator cuff tear or rupture of left shoulder, not specified as traumatic: Secondary | ICD-10-CM | POA: Diagnosis not present

## 2014-08-26 DIAGNOSIS — S43422A Sprain of left rotator cuff capsule, initial encounter: Secondary | ICD-10-CM

## 2014-08-26 DIAGNOSIS — M25512 Pain in left shoulder: Secondary | ICD-10-CM

## 2014-08-26 NOTE — Patient Instructions (Signed)
Advised Ms [name] she should not lift anything more than a cup or plate as she is not ready to be lifting anything heavy. We discussed use of cold or heat  The choice is which one feels best or to use ice with post activity  soreness in shoulder. Advised her to stop use of arm if she has more pain or tingling in arm

## 2014-08-26 NOTE — Therapy (Signed)
Physical Therapy Treatment  Patient Details  Name: Beth Meza Epp MRN: [id number] Date of Birth: 04/15/1946  Encounter Date: 08/26/2014      PT End of Session - 08/26/14 1139    Visit Number 9   Number of Visits 24   Date for PT Re-Evaluation 10/13/13   PT Start Time 1047   PT Stop Time 1135   PT Time Calculation (min) 48 min   Activity Tolerance Patient tolerated treatment well   Behavior During Therapy [facility] for tasks assessed/performed      Past Medical History  Diagnosis Date  . Back spasm   . Hand cramps   . Leg cramps   . Dizziness and giddiness 03/07/2013  . Headache(784.0) 03/07/2013  . Gastroesophageal reflux disease   . Degenerative arthritis   . Hypertension   . Dyslipidemia   . History of carpal tunnel syndrome     Bilateral  . Diabetes     diet controlled  . Memory deficit 02/25/2014  . Seizures     as child- unknown etiology-no meds  . Anemia     Past Surgical History  Procedure Laterality Date  . Back surgery    . Knee surgery Left     Pin, history of fracture  . Ankle surgery Left     History of ankle fracture  . Hand surgery      Carpal tunnel syndrome, trigger fingers bilaterally  . Shoulder open rotator cuff repair Left 04/08/2014    Dr. Romeo AppleHarrison  . Colonoscopy N/A 05/04/2014    Procedure: COLONOSCOPY;  Surgeon: West BaliSandi L Fields, MD;  [facility];  Service: Endoscopy;  Laterality: N/A;  11:30  . Shoulder open rotator cuff repair Left 06/10/2014    Procedure: REVISION ROTATOR CUFF REPAIR SHOULDER OPEN WITH GRAFT;  Surgeon: Vickki HearingStanley E Harrison, MD;  Location: [facility];  Service: Orthopedics;  Laterality: Left;  . Shoulder acromioplasty Left 06/10/2014    Procedure: SHOULDER ACROMIOPLASTY;  Surgeon: Vickki HearingStanley E Harrison, MD;  Location: [facility];  Service: Orthopedics;  Laterality: Left;    There were no vitals taken for this visit.  Visit Diagnosis:  Rotator cuff (capsule) sprain, left, subsequent encounter  Pain in joint, shoulder region,  left  Rotator cuff (capsule) sprain, left, initial encounter  Left shoulder pain      Subjective Assessment - 08/25/14 1510    Currently in Pain? Yes   Pain Score 3    Pain Location Shoulder   Pain Orientation Left   Pain Descriptors / Indicators --  twinge   Pain Type Surgical pain   Pain Onset More than a month ago   Pain Frequency Intermittent   Aggravating Factors  stretch   Pain Relieving Factors Rest   Multiple Pain Sites No          [facility] PT Assessment - 08/25/14 1519    AROM   Left Shoulder Extension 55 Degrees   Left Shoulder Flexion 85 Degrees   Left Shoulder ABduction 65 Degrees   Left Shoulder Internal Rotation --  reach to lumbar   Left Shoulder External Rotation --  reach to t2          [facility] Adult PT Treatment/Exercise - 08/26/14 1045    Shoulder Exercises: Supine   Other Supine Exercises --  cane exercises x20-25 each   Shoulder Exercises: Sidelying   External Rotation AROM;Left;15 reps  5 sec hold   ABduction Left;AAROM;15 reps   Shoulder Exercises: Standing   Other Standing Exercises --  wall ladder x 15 to L   Shoulder Exercises: Pulleys   Flexion --  4 minutes          PT Education - 08/26/14 1139    Education provided Yes   Education Details Use of arm and use of hea/cold   Person(s) Educated Patient   Methods Explanation   Comprehension Verbalized understanding              Plan - 08/26/14 1141    Clinical Impression Statement She had some tingling in LT arm after cane exercises that resolved by end of session. Pain did not increase after session   Pt will benefit from skilled therapeutic intervention in order to improve on the following deficits Decreased range of motion;Decreased strength;Impaired UE functional use;Pain   Rehab Potential Good   Clinical Impairments Affecting Rehab Potential MD cleared pt for active asssited motion and progressive stengthening   PT Frequency 2x / week   PT Duration 6 weeks   PT  Treatment/Interventions Therapeutic activities;Patient/family education;Therapeutic exercise;Manual techniques;Cryotherapy;Passive range of motion   PT Next Visit Plan progress range and strength as tolerated   PT Home Exercise Plan progress strength as able   Consulted and Agree with Plan of Care Patient   PT Plan Continue AAROM, AROM, gentle resistance as tolerated,   Measure range        Problem List Patient Active Problem List   Diagnosis Date Noted  . Unspecified constipation 04/30/2014  . Rotator cuff (capsule) sprain 04/13/2014  . Rotator cuff tear, left 02/26/2014  . Memory deficit 02/25/2014  . Dizziness and giddiness 03/07/2013  . Headache(784.0) 03/07/2013                                              Caprice Redhasse,  Meza  PT 08/26/2014, 11:46 AM

## 2014-09-01 ENCOUNTER — Encounter: Payer: Self-pay | Admitting: Neurology

## 2014-09-07 ENCOUNTER — Ambulatory Visit: Payer: Medicare Other | Admitting: Physical Therapy

## 2014-09-07 DIAGNOSIS — M25512 Pain in left shoulder: Secondary | ICD-10-CM

## 2014-09-07 DIAGNOSIS — S43422D Sprain of left rotator cuff capsule, subsequent encounter: Secondary | ICD-10-CM

## 2014-09-07 DIAGNOSIS — S43422A Sprain of left rotator cuff capsule, initial encounter: Secondary | ICD-10-CM

## 2014-09-07 DIAGNOSIS — M75102 Unspecified rotator cuff tear or rupture of left shoulder, not specified as traumatic: Secondary | ICD-10-CM | POA: Diagnosis not present

## 2014-09-07 NOTE — Therapy (Signed)
Physical Therapy Treatment  Patient Details  Name: Beth Meza MRN: [id number] Date of Birth: June 07, 1946  Encounter Date: 09/07/2014      PT End of Session - 09/07/14 1441    Visit Number 10   Number of Visits 24   Date for PT Re-Evaluation 10/13/13   PT Start Time 1402   PT Stop Time 1440   PT Time Calculation (min) 38 min   Activity Tolerance Patient tolerated treatment well  pain 5/10 after session;declined ice   Behavior During Therapy [facility] for tasks assessed/performed      Past Medical History  Diagnosis Date  . Back spasm   . Hand cramps   . Leg cramps   . Dizziness and giddiness 03/07/2013  . Headache(784.0) 03/07/2013  . Gastroesophageal reflux disease   . Degenerative arthritis   . Hypertension   . Dyslipidemia   . History of carpal tunnel syndrome     Bilateral  . Diabetes     diet controlled  . Memory deficit 02/25/2014  . Seizures     as child- unknown etiology-no meds  . Anemia     Past Surgical History  Procedure Laterality Date  . Back surgery    . Knee surgery Left     Pin, history of fracture  . Ankle surgery Left     History of ankle fracture  . Hand surgery      Carpal tunnel syndrome, trigger fingers bilaterally  . Shoulder open rotator cuff repair Left 04/08/2014    Dr. Romeo AppleHarrison  . Colonoscopy N/A 05/04/2014    Procedure: COLONOSCOPY;  Surgeon: West BaliSandi L Fields, MD;  [facility];  Service: Endoscopy;  Laterality: N/A;  11:30  . Shoulder open rotator cuff repair Left 06/10/2014    Procedure: REVISION ROTATOR CUFF REPAIR SHOULDER OPEN WITH GRAFT;  Surgeon: Vickki HearingStanley E Harrison, MD;  Location: [facility];  Service: Orthopedics;  Laterality: Left;  . Shoulder acromioplasty Left 06/10/2014    Procedure: SHOULDER ACROMIOPLASTY;  Surgeon: Vickki HearingStanley E Harrison, MD;  Location: [facility];  Service: Orthopedics;  Laterality: Left;    There were no vitals taken for this visit.  Visit Diagnosis:  Rotator cuff (capsule) sprain, left, subsequent  encounter  Pain in joint, shoulder region, left  Rotator cuff (capsule) sprain, left, initial encounter  Left shoulder pain      Subjective Assessment - 09/07/14 1410    Symptoms L shoulder is sore today; "probably because of the weather."   Pertinent History L shoulder RTC repair in July 2015, then repair again 3 weeks later due to a fall and reinjury.    Limitations Lifting;House hold activities   Currently in Pain? Yes   Pain Score 5   up to 8/10 this AM   Pain Location Shoulder   Pain Orientation Left   Pain Descriptors / Indicators Sore   Pain Type Surgical pain   Pain Onset More than a month ago   Pain Frequency Intermittent   Aggravating Factors  stretch   Pain Relieving Factors rest            [facility] Adult PT Treatment/Exercise - 09/07/14 1412    Shoulder Exercises: Supine   External Rotation AAROM;Left;20 reps;Other (comment)  cane   Flexion AAROM;Left;20 reps;Other (comment)  cane   ABduction AAROM;Left;20 reps;Other (comment)  cane; moderate cues for technique   Shoulder Exercises: Standing   Other Standing Exercises wall ladder flexion/abdct x 10 LUE   Other Standing Exercises standing isometrics with towel: flexion, ext,  er/ir 5 sec x 10 reps   Shoulder Exercises: Pulleys   Flexion Other (comment)  4 min   ABduction Other (comment)  4 min          PT Education - 09/07/14 1441    Education provided No          PT Short Term Goals - 09/07/14 1442    PT SHORT TERM GOAL #1   Title independent with initial HEP   Status On-going   PT SHORT TERM GOAL #2   Title report pain decrease 25% or more to improve active L Shoulder overhead to 100 degrees when allowed in protocol.   Status On-going   PT SHORT TERM GOAL #3   Title report increased use of L arm for home tasks/ feeding/ self care by 25% or more    Status On-going   PT SHORT TERM GOAL #4   Title no sling with ADLs   Status On-going          PT Long Term Goals - 09/07/14 1443    PT  LONG TERM GOAL #1   Title Demonstrate and/or verbalize techniques to reduce the risk of re-injury to include info on: anti-inflammatory ( RICE method).   Status On-going   PT LONG TERM GOAL #2   Title be indep with advanced HEP   Status On-going   PT LONG TERM GOAL #3   Title report pain decreased 75% or more to return to normal home tasks   Status On-going   PT LONG TERM GOAL #4   Title Improve active range to [facility] to allow independence with self care and light to moderate home tasks   Status On-going          Plan - 09/07/14 1441    Clinical Impression Statement Pt improving with ROM and beginning to progress strengthening exercises.  Will continue to benefit from PT to maximize functional mobility and decrease pain.   PT Next Visit Plan progress range and strength as tolerated; FOTO   PT Home Exercise Plan progress strength as able   Consulted and Agree with Plan of Care Patient   PT Plan Continue AAROM, AROM, gentle resistance as tolerated,   Measure range          G-Codes - 09/07/14 1443    Functional Assessment Tool Used clinical judgement; ROM L shoulder flex 85; L shoulder abdct 65; L shoulder ext 55   Functional Limitation Mobility: Walking and moving around   Mobility: Walking and Moving Around Current Status [phone](G8978) At least 40 percent but less than 60 percent impaired, limited or restricted   Mobility: Walking and Moving Around Goal Status [phone](G8979) At least 20 percent but less than 40 percent impaired, limited or restricted      Problem List Patient Active Problem List   Diagnosis Date Noted  . Unspecified constipation 04/30/2014  . Rotator cuff (capsule) sprain 04/13/2014  . Rotator cuff tear, left 02/26/2014  . Memory deficit 02/25/2014  . Dizziness and giddiness 03/07/2013  . Headache(784.0) 03/07/2013                                            Clarita CraneStephanie F , PT, DPT 09/07/2014 2:46 PM [address] [phone] (office) [phone] (fax)

## 2014-09-10 ENCOUNTER — Ambulatory Visit: Payer: Medicare Other | Attending: Orthopedic Surgery | Admitting: Physical Therapy

## 2014-09-10 DIAGNOSIS — S43422D Sprain of left rotator cuff capsule, subsequent encounter: Secondary | ICD-10-CM

## 2014-09-10 DIAGNOSIS — M6530 Trigger finger, unspecified finger: Secondary | ICD-10-CM | POA: Insufficient documentation

## 2014-09-10 DIAGNOSIS — R293 Abnormal posture: Secondary | ICD-10-CM | POA: Insufficient documentation

## 2014-09-10 DIAGNOSIS — M25629 Stiffness of unspecified elbow, not elsewhere classified: Secondary | ICD-10-CM | POA: Diagnosis not present

## 2014-09-10 DIAGNOSIS — M75100 Unspecified rotator cuff tear or rupture of unspecified shoulder, not specified as traumatic: Secondary | ICD-10-CM | POA: Diagnosis present

## 2014-09-10 DIAGNOSIS — M75102 Unspecified rotator cuff tear or rupture of left shoulder, not specified as traumatic: Secondary | ICD-10-CM | POA: Diagnosis not present

## 2014-09-10 DIAGNOSIS — M25519 Pain in unspecified shoulder: Secondary | ICD-10-CM | POA: Diagnosis not present

## 2014-09-10 DIAGNOSIS — M25512 Pain in left shoulder: Secondary | ICD-10-CM

## 2014-09-10 DIAGNOSIS — S43422A Sprain of left rotator cuff capsule, initial encounter: Secondary | ICD-10-CM

## 2014-09-10 NOTE — Therapy (Signed)
[facility] [place], [place], [address] Phone: [phone]   Fax:  [phone]  Physical Therapy Treatment  Patient Details  Name: Beth Meza MRN: [id number] Date of Birth: 10/13/1945  Encounter Date: 09/10/2014      PT End of Session - 09/10/14 1540    Visit Number 11   Number of Visits 24   Date for PT Re-Evaluation 10/13/13   PT Start Time 1500   PT Stop Time 1541   PT Time Calculation (min) 41 min   Activity Tolerance Patient tolerated treatment well   Behavior During Therapy [facility] for tasks assessed/performed      Past Medical History  Diagnosis Date  . Back spasm   . Hand cramps   . Leg cramps   . Dizziness and giddiness 03/07/2013  . Headache(784.0) 03/07/2013  . Gastroesophageal reflux disease   . Degenerative arthritis   . Hypertension   . Dyslipidemia   . History of carpal tunnel syndrome     Bilateral  . Diabetes     diet controlled  . Memory deficit 02/25/2014  . Seizures     as child- unknown etiology-no meds  . Anemia     Past Surgical History  Procedure Laterality Date  . Back surgery    . Knee surgery Left     Pin, history of fracture  . Ankle surgery Left     History of ankle fracture  . Hand surgery      Carpal tunnel syndrome, trigger fingers bilaterally  . Shoulder open rotator cuff repair Left 04/08/2014    Dr. Romeo AppleHarrison  . Colonoscopy N/A 05/04/2014    Procedure: COLONOSCOPY;  Surgeon: West BaliSandi L Fields, MD;  [facility];  Service: Endoscopy;  Laterality: N/A;  11:30  . Shoulder open rotator cuff repair Left 06/10/2014    Procedure: REVISION ROTATOR CUFF REPAIR SHOULDER OPEN WITH GRAFT;  Surgeon: Vickki HearingStanley E Harrison, MD;  Location: [facility];  Service: Orthopedics;  Laterality: Left;  . Shoulder acromioplasty Left 06/10/2014    Procedure: SHOULDER ACROMIOPLASTY;  Surgeon: Vickki HearingStanley E Harrison, MD;  Location: [facility];  Service: Orthopedics;  Laterality: Left;    There were no vitals  taken for this visit.  Visit Diagnosis:  Rotator cuff (capsule) sprain, left, subsequent encounter  Pain in joint, shoulder region, left  Rotator cuff (capsule) sprain, left, initial encounter  Left shoulder pain      Subjective Assessment - 09/10/14 1506    Symptoms L shoulder feels good today, denies pain.  Reports a little soreness.   Pertinent History L shoulder RTC repair in July 2015, then repair again 3 weeks later due to a fall and reinjury.    Limitations Lifting;House hold activities   Currently in Pain? No/denies          [facility] - 09/10/14 1513    AROM   Left Shoulder Flexion 131 Degrees  supine   Left Shoulder ABduction 90 Degrees  supine   Left Shoulder External Rotation 40 Degrees          [facility] Adult PT Treatment/Exercise - 09/10/14 1507    Shoulder Exercises: Supine   External Rotation AAROM;Left;15 reps;Other (comment)  cane   Flexion AAROM;Left;15 reps;Other (comment)  cane   ABduction AAROM;Left;15 reps;Other (comment)  improved technique   Shoulder Exercises: Standing   Flexion AAROM;15 reps;Other (comment);Left  with ball and wall ladder   ABduction AAROM;Left;15 reps;Other (comment)  with ball   Shoulder Exercises: Pulleys  Flexion 3 minutes   ABduction 3 minutes          PT Education - 09/10/14 1540    Education provided No              Plan - 09/10/14 1541    Clinical Impression Statement ROM significantly improved with flexion and abduction.  Pt continues to demonstrate functional improvement.   PT Next Visit Plan progress range and strength as tolerated; FOTO   PT Home Exercise Plan progress strength as able   Consulted and Agree with Plan of Care Patient                               Problem List Patient Active Problem List   Diagnosis Date Noted  . Unspecified constipation 04/30/2014  . Rotator cuff (capsule) sprain 04/13/2014  . Rotator cuff tear, left 02/26/2014  . Memory  deficit 02/25/2014  . Dizziness and giddiness 03/07/2013  . ZOXWRUEA(540.9Headache(784.0) 03/07/2013    Clarita CraneStephanie F , PT, DPT 09/10/2014 3:43 PM  [facility] [address] , [place] [address]  [phone] (office) [phone] (fax)

## 2014-09-11 ENCOUNTER — Ambulatory Visit: Payer: Self-pay | Admitting: Neurology

## 2014-09-11 ENCOUNTER — Encounter: Payer: Self-pay | Admitting: Neurology

## 2014-09-11 ENCOUNTER — Ambulatory Visit (INDEPENDENT_AMBULATORY_CARE_PROVIDER_SITE_OTHER): Payer: Medicare Other | Admitting: Neurology

## 2014-09-11 VITALS — BP 115/66 | HR 105

## 2014-09-11 DIAGNOSIS — G441 Vascular headache, not elsewhere classified: Secondary | ICD-10-CM

## 2014-09-11 DIAGNOSIS — R251 Tremor, unspecified: Secondary | ICD-10-CM

## 2014-09-11 DIAGNOSIS — R413 Other amnesia: Secondary | ICD-10-CM

## 2014-09-11 MED ORDER — TOPIRAMATE 25 MG PO TABS: ORAL_TABLET | ORAL | Status: DC

## 2014-09-11 NOTE — Progress Notes (Signed)
Reason for visit: Headache  Beth SportsmanBeulah Milus GlazierM Meza is an 68 y.o. female  History of present illness:  Ms. Beth Meza is a 68 year old right-handed black female with a migraine headache. The patient has done quite well with her headache and she indicates that she rarely has headaches at this time. She remains on Topamax taking 50 mg at night. She continues report issues with memory, and she indicates that mainly this involves word finding problems. The patient recently had surgery on her left rotator cuff, and she currently is in physical therapy for this. She also reports some occasional tremors affecting the right greater than left lower extremity that may occur while sitting. If she repositions the legs, the tremor goes away. No tremors are noted in the arms. The patient has a history of diabetes, but she indicates that her blood sugars usually run between 100-110, and she does not report episodes of hypoglycemia that may be associated with the tremor. She returns to this office for an evaluation.  Past Medical History  Diagnosis Date  . Back spasm   . Hand cramps   . Leg cramps   . Dizziness and giddiness 03/07/2013  . Headache(784.0) 03/07/2013  . Gastroesophageal reflux disease   . Degenerative arthritis   . Hypertension   . Dyslipidemia   . History of carpal tunnel syndrome     Bilateral  . Diabetes     diet controlled  . Memory deficit 02/25/2014  . Seizures     as child- unknown etiology-no meds  . Anemia     Past Surgical History  Procedure Laterality Date  . Back surgery    . Knee surgery Left     Pin, history of fracture  . Ankle surgery Left     History of ankle fracture  . Hand surgery      Carpal tunnel syndrome, trigger fingers bilaterally  . Shoulder open rotator cuff repair Left 04/08/2014    Dr. Romeo AppleHarrison  . Colonoscopy N/A 05/04/2014    Procedure: COLONOSCOPY;  Surgeon: West BaliSandi L Fields, MD;  [facility];  Service: Endoscopy;  Laterality: N/A;  11:30  .  Shoulder open rotator cuff repair Left 06/10/2014    Procedure: REVISION ROTATOR CUFF REPAIR SHOULDER OPEN WITH GRAFT;  Surgeon: Vickki HearingStanley E Harrison, MD;  Location: [facility];  Service: Orthopedics;  Laterality: Left;  . Shoulder acromioplasty Left 06/10/2014    Procedure: SHOULDER ACROMIOPLASTY;  Surgeon: Vickki HearingStanley E Harrison, MD;  Location: [facility];  Service: Orthopedics;  Laterality: Left;    Family History  Problem Relation Age of Onset  . Heart attack Father   . Colon cancer Neg Hx     Social history:  reports that she has never smoked. She has never used smokeless tobacco. She reports that she does not drink alcohol or use illicit drugs.   No Known Allergies  Medications:  Current Outpatient Prescriptions on File Prior to Visit  Medication Sig Dispense Refill  . amLODipine (NORVASC) 5 MG tablet Take 5 mg by mouth daily.    . cyclobenzaprine (FLEXERIL) 5 MG tablet Take 1 tablet (5 mg total) by mouth 3 (three) times daily as needed for muscle spasms. 60 tablet 1  . diclofenac sodium (VOLTAREN) 1 % GEL Apply topically 4 (four) times daily.    . ferrous sulfate 325 (65 FE) MG tablet Take 325 mg by mouth daily with breakfast.    . HYDROcodone-acetaminophen (NORCO) 10-325 MG per tablet Take 1 tablet by mouth every 6 (six)  hours as needed for moderate pain. 84 tablet 0  . lisinopril-hydrochlorothiazide (PRINZIDE,ZESTORETIC) 20-25 MG per tablet Take 1 tablet by mouth daily.    . Multiple Vitamin (MULTIVITAMIN) tablet Take 1 tablet by mouth daily.    . Omega-3 Fatty Acids (FISH OIL) 1000 MG CAPS Take 1 capsule by mouth daily.    . pravastatin (PRAVACHOL) 40 MG tablet Take 40 mg by mouth daily.    . ranitidine (ZANTAC) 150 MG capsule Take 150 mg by mouth 2 (two) times daily.    . vitamin C (ASCORBIC ACID) 500 MG tablet Take 500 mg by mouth daily.     No current facility-administered medications on file prior to visit.    ROS:  Out of a complete 14 system review of symptoms, the patient complains  only of the following symptoms, and all other reviewed systems are negative.  Appetite change Hearing loss, ringing in the ears Blurred vision Chest pain Incontinence of the bladder Memory loss, dizziness, numbness Depression Moles  Blood pressure 115/66, pulse 105.  Physical Exam  General: The patient is alert and cooperative at the time of the examination.  Skin: No significant peripheral edema is noted.   Neurologic Exam  Mental status: The patient is oriented x 3.  Cranial nerves: Facial symmetry is present. Speech is normal, no aphasia or dysarthria is noted. Extraocular movements are full. Visual fields are full.  Motor: The patient has good strength in all 4 extremities.  Sensory examination:  Sensation is symmetric on the face, arms, and legs.  Coordination: The patient has good finger-nose-finger and heel-to-shin bilaterally. No tremors are seen.  Gait and station: The patient has a normal gait. Tandem gait is normal. Romberg is negative. No drift is seen.  Reflexes: Deep tendon reflexes are symmetric.   Assessment/Plan:  1. Migraine headache  2. Memory disorder, word finding problems  3. Diabetes  4. Leg tremors  The patient is doing relatively well with her migraine. The word finding problems may be related to the Topamax, not to an underlying dementing illness. The patient will cut back on the Topamax taking 25 mg at night. The patient will contact me if she does not believe that the word finding issues are improving. If the headaches return, she may have to go back up on the dose. The leg tremors are of unclear etiology, the patient does not relate this to blood sugar variations. No tremor is seen today. She will have annual blood work done by her primary care physician within the next month, this likely will include a thyroid profile. She will follow-up in 6 months.  Marlan Palau. Keith  MD 09/12/2014 11:03 AM  [facility] [address] [place], [place] [id number]  Phone [phone] Fax [phone]

## 2014-09-11 NOTE — Patient Instructions (Signed)

## 2014-09-14 ENCOUNTER — Ambulatory Visit: Payer: Medicare Other | Admitting: Rehabilitation

## 2014-09-14 DIAGNOSIS — S43422D Sprain of left rotator cuff capsule, subsequent encounter: Secondary | ICD-10-CM

## 2014-09-14 DIAGNOSIS — M25512 Pain in left shoulder: Secondary | ICD-10-CM

## 2014-09-14 DIAGNOSIS — S43422A Sprain of left rotator cuff capsule, initial encounter: Secondary | ICD-10-CM

## 2014-09-14 DIAGNOSIS — M75102 Unspecified rotator cuff tear or rupture of left shoulder, not specified as traumatic: Secondary | ICD-10-CM | POA: Diagnosis not present

## 2014-09-14 NOTE — Patient Instructions (Signed)
Wall Slide With Cervical Neutral / Upward Scapular Rotation: Standing   Elbows bent, little fingers on wall, thumbs out. Slide up, shoulders down. Slide only as far as possible without pain. Do _10__ times, _2__ times per day.  http://ss.exer.us/225   Copyright  VHI. All rights reserved.

## 2014-09-14 NOTE — Therapy (Signed)
[facility] [address] [place], [place], [address] Phone: [phone]   Fax:  [phone]  Physical Therapy Treatment  Patient Details  Name: Beth Meza MRN: [id number] Date of Birth: 05-11-1946  Encounter Date: 09/14/2014      PT End of Session - 09/14/14 1513    Visit Number 12   Number of Visits 24   Date for PT Re-Evaluation 10/13/13   PT Start Time 0225   PT Stop Time 0300   PT Time Calculation (min) 35 min      Past Medical History  Diagnosis Date  . Back spasm   . Hand cramps   . Leg cramps   . Dizziness and giddiness 03/07/2013  . Headache(784.0) 03/07/2013  . Gastroesophageal reflux disease   . Degenerative arthritis   . Hypertension   . Dyslipidemia   . History of carpal tunnel syndrome     Bilateral  . Diabetes     diet controlled  . Memory deficit 02/25/2014  . Seizures     as child- unknown etiology-no meds  . Anemia     Past Surgical History  Procedure Laterality Date  . Back surgery    . Knee surgery Left     Pin, history of fracture  . Ankle surgery Left     History of ankle fracture  . Hand surgery      Carpal tunnel syndrome, trigger fingers bilaterally  . Shoulder open rotator cuff repair Left 04/08/2014    Dr. Romeo AppleHarrison  . Colonoscopy N/A 05/04/2014    Procedure: COLONOSCOPY;  Surgeon: West BaliSandi L Fields, MD;  [facility];  Service: Endoscopy;  Laterality: N/A;  11:30  . Shoulder open rotator cuff repair Left 06/10/2014    Procedure: REVISION ROTATOR CUFF REPAIR SHOULDER OPEN WITH GRAFT;  Surgeon: Vickki HearingStanley E Harrison, MD;  Location: [facility];  Service: Orthopedics;  Laterality: Left;  . Shoulder acromioplasty Left 06/10/2014    Procedure: SHOULDER ACROMIOPLASTY;  Surgeon: Vickki HearingStanley E Harrison, MD;  Location: [facility];  Service: Orthopedics;  Laterality: Left;    There were no vitals taken for this visit.  Visit Diagnosis:  Rotator cuff (capsule) sprain, left, subsequent encounter  Pain in joint,  shoulder region, left  Rotator cuff (capsule) sprain, left, initial encounter  Left shoulder pain        [facility] PT Assessment - 09/14/14 1446    AROM   Left Shoulder Flexion 90 Degrees   Left Shoulder ABduction 85 Degrees   Left Shoulder Internal Rotation --  L1   Left Shoulder External Rotation --  T1   PROM   Left Shoulder Flexion 135 Degrees   Left Shoulder ABduction 100 Degrees   Left Shoulder Internal Rotation 65 Degrees   Left Shoulder External Rotation 50 Degrees          [facility] Adult PT Treatment/Exercise - 09/14/14 1507    Lumbar Exercises: Aerobic   UBE (Upper Arm Bike) Level 1 3 min for 3 min back   Shoulder Exercises: Supine   Flexion AROM;Left;10 reps   Shoulder Exercises: Sidelying   External Rotation AROM;Left;10 reps   ABduction AROM;Left;10 reps   Shoulder Exercises: Pulleys   Flexion 3 minutes   ABduction 3 minutes   Shoulder Exercises: ROM/Strengthening   Other ROM/Strengthening Exercises wall slide x10 flexion          PT Education - 09/14/14 1506    Education provided Yes   Education Details Wall slide   Person(s) Educated Patient  Methods Explanation   Comprehension Verbalized understanding          PT Short Term Goals - 09/14/14 1501    PT SHORT TERM GOAL #1   Title independent with initial HEP   Status Achieved   PT SHORT TERM GOAL #2   Title report pain decrease 25% or more to improve active L Shoulder overhead to 100 degrees when allowed in protocol.   Status On-going   PT SHORT TERM GOAL #3   Title report increased use of L arm for home tasks/ feeding/ self care by 25% or more    Status Achieved   PT SHORT TERM GOAL #4   Title no sling with ADLs   Status Achieved          PT Long Term Goals - 09/14/14 1502    PT LONG TERM GOAL #1   Title Demonstrate and/or verbalize techniques to reduce the risk of re-injury to include info on: anti-inflammatory ( RICE method).   Status On-going   PT LONG TERM GOAL #2   Title be  indep with advanced HEP   Status On-going   PT LONG TERM GOAL #3   Title report pain decreased 75% or more to return to normal home tasks   Status On-going   PT LONG TERM GOAL #4   Title Improve active range to [facility] to allow independence with self care and light to moderate home tasks   Status On-going   PT LONG TERM GOAL #5   Title returns to driving   Status On-going          Plan - 09/14/14 1514    Clinical Impression Statement AROM limited, using LUE for more home tasks, no sling last couple of days, pain levels lower   PT Next Visit Plan progress range and strength as tolerated; FOTO   PT Plan Continue AAROM, AROM, gentle resistance as tolerated,   Measure range         Problem List Patient Active Problem List   Diagnosis Date Noted  . Tremor 09/11/2014  . Unspecified constipation 04/30/2014  . Rotator cuff (capsule) sprain 04/13/2014  . Rotator cuff tear, left 02/26/2014  . Memory deficit 02/25/2014  . Dizziness and giddiness 03/07/2013  . Headache 03/07/2013    Sherrie Mustacheonoho,  [employer], PTA 09/14/2014, 3:17 PM

## 2014-09-15 ENCOUNTER — Telehealth: Payer: Self-pay | Admitting: Neurology

## 2014-09-15 NOTE — Telephone Encounter (Signed)
Patient stated [facility] on [address] has not received Rx refill for topiramate (TOPAMAX) 25 MG tablet.  Please call and advise.

## 2014-09-15 NOTE — Telephone Encounter (Signed)
I called the pharmacy.  They said they did get the Rx we sent, and it was saved on the patient's file.  They will process Rx today, and notify patient when it is ready.  I called the patient back to advise.  Was not able to reach her.  No voicemail available to leave message.

## 2014-09-16 ENCOUNTER — Ambulatory Visit: Payer: Medicare Other | Admitting: Rehabilitation

## 2014-09-16 DIAGNOSIS — M25512 Pain in left shoulder: Secondary | ICD-10-CM

## 2014-09-16 DIAGNOSIS — S43422D Sprain of left rotator cuff capsule, subsequent encounter: Secondary | ICD-10-CM

## 2014-09-16 DIAGNOSIS — M75102 Unspecified rotator cuff tear or rupture of left shoulder, not specified as traumatic: Secondary | ICD-10-CM | POA: Diagnosis not present

## 2014-09-16 DIAGNOSIS — S43422A Sprain of left rotator cuff capsule, initial encounter: Secondary | ICD-10-CM

## 2014-09-16 NOTE — Therapy (Signed)
[facility] [address] [place], [place], [address] Phone: [phone]   Fax:  [phone]  Physical Therapy Treatment  Patient Details  Name: Beth LabsBeulah M Weisensel MRN: [id number] Date of Birth: September 07, 1946  Encounter Date: 09/16/2014      PT End of Session - 09/16/14 1507    Visit Number 13   Number of Visits 24   Date for PT Re-Evaluation 10/13/13   PT Start Time (ACUTE ONLY) 0215   PT Stop Time (ACUTE ONLY) 0300   PT Time Calculation (min) (ACUTE ONLY) 45 min      Past Medical History  Diagnosis Date  . Back spasm   . Hand cramps   . Leg cramps   . Dizziness and giddiness 03/07/2013  . Headache(784.0) 03/07/2013  . Gastroesophageal reflux disease   . Degenerative arthritis   . Hypertension   . Dyslipidemia   . History of carpal tunnel syndrome     Bilateral  . Diabetes     diet controlled  . Memory deficit 02/25/2014  . Seizures     as child- unknown etiology-no meds  . Anemia     Past Surgical History  Procedure Laterality Date  . Back surgery    . Knee surgery Left     Pin, history of fracture  . Ankle surgery Left     History of ankle fracture  . Hand surgery      Carpal tunnel syndrome, trigger fingers bilaterally  . Shoulder open rotator cuff repair Left 04/08/2014    Dr. Romeo AppleHarrison  . Colonoscopy N/A 05/04/2014    Procedure: COLONOSCOPY;  Surgeon: West BaliSandi L Fields, MD;  [facility];  Service: Endoscopy;  Laterality: N/A;  11:30  . Shoulder open rotator cuff repair Left 06/10/2014    Procedure: REVISION ROTATOR CUFF REPAIR SHOULDER OPEN WITH GRAFT;  Surgeon: Vickki HearingStanley E Harrison, MD;  Location: [facility];  Service: Orthopedics;  Laterality: Left;  . Shoulder acromioplasty Left 06/10/2014    Procedure: SHOULDER ACROMIOPLASTY;  Surgeon: Vickki HearingStanley E Harrison, MD;  Location: [facility];  Service: Orthopedics;  Laterality: Left;    There were no vitals taken for this visit.  Visit Diagnosis:  Rotator cuff (capsule) sprain, left,  subsequent encounter  Pain in joint, shoulder region, left  Rotator cuff (capsule) sprain, left, initial encounter  Left shoulder pain      Subjective Assessment - 09/16/14 1455    Symptoms a little stiff   Pain Score 4    Pain Location Shoulder   Pain Orientation Left   Pain Descriptors / Indicators Tightness;Sore   Aggravating Factors  stretch   Pain Relieving Factors rest          [facility] PT Assessment - 09/16/14 1427    Observation/Other Assessments   Focus on Therapeutic Outcomes (FOTO)  43% limited          [facility] Adult PT Treatment/Exercise - 09/16/14 1431    Lumbar Exercises: Aerobic   UBE (Upper Arm Bike) level 1 4 min for 2 min back   Shoulder Exercises: Supine   External Rotation PROM;AAROM  AAROM with cane 30 sec x 3   Flexion AAROM  hands clasped x 10   Shoulder Exercises: Standing   Extension 15 reps;Both;Theraband   Theraband Level (Shoulder Extension) Level 2 (Red)   Row Both;15 reps;Theraband   Theraband Level (Shoulder Row) Level 2 (Red)   Other Standing Exercises Wall slides bilateral x 20 flexion   Other Standing Exercises Wall ladder x 10 scaption  Shoulder Exercises: Pulleys   Flexion 3 minutes   ABduction 3 minutes   Manual Therapy   Manual Therapy Joint mobilization   Joint Mobilization gentle grade 2 glides AP and inferior, pt very tender superior lateral shoulder          PT Education - 09/16/14 1502    Education provided Yes   Education Details HEP    Person(s) Educated Patient   Methods Explanation;Handout   Comprehension Verbalized understanding              Plan - 09/16/14 1502    Clinical Impression Statement PROM and AROM limited, pt reports feeling stiff, encouaged her to continue stretching to maximize ROM   PT Next Visit Plan Continue PROM and joint mobs to maximize ROM         Problem List Patient Active Problem List   Diagnosis Date Noted  . Tremor 09/11/2014  . Unspecified constipation 04/30/2014   . Rotator cuff (capsule) sprain 04/13/2014  . Rotator cuff tear, left 02/26/2014  . Memory deficit 02/25/2014  . Dizziness and giddiness 03/07/2013  . Headache 03/07/2013    Sherrie Mustacheonoho,  [employer], PTA 09/16/2014, 3:07 PM

## 2014-09-16 NOTE — Patient Instructions (Signed)
External Rotation (Isometric)   With elbow bent and held at side, use other hand to apply resistance to outward motion of arm. Hold _5___ seconds. Repeat _10___ times. Do __2__ sessions per day.  Copyright  VHI. All rights reserved.  Internal Rotation (Isometric)   With elbow bent and held at side, use other hand to apply resistance to inward motion of arm. Hold _5___ seconds. Repeat __10__ times. Do _2___ sessions per day.   External Rotation (Eccentric), Active-Assist - Supine (Cane)  Lie on back, affected arm out from side, 30 degress or less, forearm forward. Use cane to assist in lifting forearm of affected arm.Hold 30seconds. _3__ reps per set, _2_ set per day Copyright  VHI. All rights reserved.

## 2014-09-21 ENCOUNTER — Telehealth: Payer: Self-pay | Admitting: *Deleted

## 2014-09-21 ENCOUNTER — Ambulatory Visit: Payer: Medicare Other | Admitting: Physical Therapy

## 2014-09-21 DIAGNOSIS — S43422D Sprain of left rotator cuff capsule, subsequent encounter: Secondary | ICD-10-CM

## 2014-09-21 DIAGNOSIS — M25512 Pain in left shoulder: Secondary | ICD-10-CM

## 2014-09-21 DIAGNOSIS — M75102 Unspecified rotator cuff tear or rupture of left shoulder, not specified as traumatic: Secondary | ICD-10-CM | POA: Diagnosis not present

## 2014-09-21 DIAGNOSIS — S43422A Sprain of left rotator cuff capsule, initial encounter: Secondary | ICD-10-CM

## 2014-09-21 NOTE — Therapy (Signed)
[facility] [address] [place], [place], [address] Phone: [phone]   Fax:  [phone]  Physical Therapy Treatment  Patient Details  Name: Beth Meza MRN: [id number] Date of Birth: [birth year]  Encounter Date: 09/21/2014      PT End of Session - 09/21/14 1535    Visit Number 14   Number of Visits 24   Date for PT Re-Evaluation 10/13/13   PT Start Time 1500   PT Stop Time 1544   PT Time Calculation (min) 44 min   Activity Tolerance Patient tolerated treatment well   Behavior During Therapy [facility] for tasks assessed/performed      Past Medical History  Diagnosis Date  . Back spasm   . Hand cramps   . Leg cramps   . Dizziness and giddiness 03/07/2013  . Headache(784.0) 03/07/2013  . Gastroesophageal reflux disease   . Degenerative arthritis   . Hypertension   . Dyslipidemia   . History of carpal tunnel syndrome     Bilateral  . Diabetes     diet controlled  . Memory deficit 02/25/2014  . Seizures     as child- unknown etiology-no meds  . Anemia     Past Surgical History  Procedure Laterality Date  . Back surgery    . Knee surgery Left     Pin, history of fracture  . Ankle surgery Left     History of ankle fracture  . Hand surgery      Carpal tunnel syndrome, trigger fingers bilaterally  . Shoulder open rotator cuff repair Left 04/08/2014    Dr. Romeo AppleHarrison  . Colonoscopy N/A 05/04/2014    Procedure: COLONOSCOPY;  Surgeon: West BaliSandi L Fields, MD;  [facility];  Service: Endoscopy;  Laterality: N/A;  11:30  . Shoulder open rotator cuff repair Left 06/10/2014    Procedure: REVISION ROTATOR CUFF REPAIR SHOULDER OPEN WITH GRAFT;  Surgeon: Vickki HearingStanley E Harrison, MD;  Location: [facility];  Service: Orthopedics;  Laterality: Left;  . Shoulder acromioplasty Left 06/10/2014    Procedure: SHOULDER ACROMIOPLASTY;  Surgeon: Vickki HearingStanley E Harrison, MD;  Location: [facility];  Service: Orthopedics;  Laterality: Left;    There were no vitals  taken for this visit.  Visit Diagnosis:  Rotator cuff (capsule) sprain, left, subsequent encounter  Pain in joint, shoulder region, left  Rotator cuff (capsule) sprain, left, initial encounter  Left shoulder pain      Subjective Assessment - 09/21/14 1508    Symptoms L shoulder feels good today.   Pertinent History L shoulder RTC repair in July 2015, then repair again 3 weeks later due to a fall and reinjury.    Limitations Lifting;House hold activities   Currently in Pain? No/denies  "just a little tenderness"            [facility] Adult PT Treatment/Exercise - 09/21/14 1508    Shoulder Exercises: Standing   Horizontal ABduction Strengthening;Theraband;10 reps   Theraband Level (Shoulder Horizontal ABduction) Level 1 (Yellow)   Horizontal ABduction Limitations mod cues to decrease substitution   External Rotation Strengthening;Left;15 reps;Theraband   Theraband Level (Shoulder External Rotation) Level 2 (Red)   Internal Rotation Strengthening;Left;15 reps;Theraband   Theraband Level (Shoulder Internal Rotation) Level 2 (Red)   Flexion Strengthening;Left;15 reps;Weights   Shoulder Flexion Weight (lbs) 1   Flexion Limitations to  100 degrees flex   ABduction Strengthening;Left;15 reps;Weights   Shoulder ABduction Weight (lbs) 1   ABduction Limitations to 90 degrees abdct   Extension Strengthening;Left;15 reps;Theraband  Theraband Level (Shoulder Extension) Level 2 (Red)   Retraction Left;Strengthening;15 reps;Theraband   Theraband Level (Shoulder Retraction) Level 2 (Red)   Shoulder Exercises: Pulleys   Flexion 3 minutes   ABduction 3 minutes   Modalities   Modalities Cryotherapy   Cryotherapy   Number Minutes Cryotherapy 10 Minutes   Cryotherapy Location Shoulder   Type of Cryotherapy Ice pack   Shoulder Exercises: ROM/Strengthening   UBE (Upper Arm Bike) 8 min level 1; 4 min forward/934min backward          PT Education - 09/21/14 1535    Education provided No           PT Short Term Goals - 09/21/14 1536    PT SHORT TERM GOAL #1   Title independent with initial HEP   Status Achieved   PT SHORT TERM GOAL #2   Title report pain decrease 25% or more to improve active L Shoulder overhead to 100 degrees when allowed in protocol.   Status On-going   PT SHORT TERM GOAL #3   Title report increased use of L arm for home tasks/ feeding/ self care by 25% or more    Status Achieved   PT SHORT TERM GOAL #4   Title no sling with ADLs   Status Achieved          PT Long Term Goals - 09/21/14 1536    PT LONG TERM GOAL #1   Title Demonstrate and/or verbalize techniques to reduce the risk of re-injury to include info on: anti-inflammatory ( RICE method).   Status On-going   PT LONG TERM GOAL #2   Title be indep with advanced HEP   Status On-going   PT LONG TERM GOAL #3   Title report pain decreased 75% or more to return to normal home tasks   Status Achieved   PT LONG TERM GOAL #4   Title Improve active range to [facility] to allow independence with self care and light to moderate home tasks   Status On-going   PT LONG TERM GOAL #5   Title returns to driving   Status On-going          Plan - 09/21/14 1535    Clinical Impression Statement Passive and Active assistive ROM improving, continues to be limited with active ROM.  Will continue to benefit from PT to maximize function.   PT Next Visit Plan continue ROM and strengthening   PT Home Exercise Plan progress strength as able   Consulted and Agree with Plan of Care Patient                               Problem List Patient Active Problem List   Diagnosis Date Noted  . Tremor 09/11/2014  . Unspecified constipation 04/30/2014  . Rotator cuff (capsule) sprain 04/13/2014  . Rotator cuff tear, left 02/26/2014  . Memory deficit 02/25/2014  . Dizziness and giddiness 03/07/2013  . Headache 03/07/2013     Clarita CraneStephanie F , PT, DPT 09/21/2014 3:49 PM  [facility] [address] [place], [place] [address]  [phone] (office) [phone] (fax)

## 2014-09-21 NOTE — Telephone Encounter (Signed)
appts made and printed...td 

## 2014-09-23 ENCOUNTER — Ambulatory Visit: Payer: Medicare Other | Admitting: Rehabilitation

## 2014-09-23 DIAGNOSIS — S43422D Sprain of left rotator cuff capsule, subsequent encounter: Secondary | ICD-10-CM

## 2014-09-23 DIAGNOSIS — S43422A Sprain of left rotator cuff capsule, initial encounter: Secondary | ICD-10-CM

## 2014-09-23 DIAGNOSIS — M25512 Pain in left shoulder: Secondary | ICD-10-CM

## 2014-09-23 DIAGNOSIS — M75102 Unspecified rotator cuff tear or rupture of left shoulder, not specified as traumatic: Secondary | ICD-10-CM | POA: Diagnosis not present

## 2014-09-23 NOTE — Therapy (Signed)
[facility] [address] [place], [place], [address] Phone: [phone]   Fax:  [phone]  Physical Therapy Treatment  Patient Details  Name: Beth LabsBeulah M Meza MRN: [id number] Date of Birth: 1946-01-11  Encounter Date: 09/23/2014      PT End of Session - 09/23/14 1446    Visit Number 15   Number of Visits 24   Date for PT Re-Evaluation 10/13/13   PT Start Time 0215   PT Stop Time 0255   PT Time Calculation (min) 40 min      Past Medical History  Diagnosis Date  . Back spasm   . Hand cramps   . Leg cramps   . Dizziness and giddiness 03/07/2013  . Headache(784.0) 03/07/2013  . Gastroesophageal reflux disease   . Degenerative arthritis   . Hypertension   . Dyslipidemia   . History of carpal tunnel syndrome     Bilateral  . Diabetes     diet controlled  . Memory deficit 02/25/2014  . Seizures     as child- unknown etiology-no meds  . Anemia     Past Surgical History  Procedure Laterality Date  . Back surgery    . Knee surgery Left     Pin, history of fracture  . Ankle surgery Left     History of ankle fracture  . Hand surgery      Carpal tunnel syndrome, trigger fingers bilaterally  . Shoulder open rotator cuff repair Left 04/08/2014    Dr. Romeo AppleHarrison  . Colonoscopy N/A 05/04/2014    Procedure: COLONOSCOPY;  Surgeon: West BaliSandi L Fields, MD;  [facility];  Service: Endoscopy;  Laterality: N/A;  11:30  . Shoulder open rotator cuff repair Left 06/10/2014    Procedure: REVISION ROTATOR CUFF REPAIR SHOULDER OPEN WITH GRAFT;  Surgeon: Vickki HearingStanley E Harrison, MD;  Location: [facility];  Service: Orthopedics;  Laterality: Left;  . Shoulder acromioplasty Left 06/10/2014    Procedure: SHOULDER ACROMIOPLASTY;  Surgeon: Vickki HearingStanley E Harrison, MD;  Location: [facility];  Service: Orthopedics;  Laterality: Left;    There were no vitals taken for this visit.  Visit Diagnosis:  Rotator cuff (capsule) sprain, left, subsequent encounter  Pain in  joint, shoulder region, left  Rotator cuff (capsule) sprain, left, initial encounter  Left shoulder pain      Subjective Assessment - 09/23/14 1443    Symptoms not much pain   Aggravating Factors  stretches    Pain Relieving Factors rest   Multiple Pain Sites No          [facility] PT Assessment - 09/23/14 0001    AROM   Left Shoulder Flexion 120 Degrees   Left Shoulder ABduction 110 Degrees   Left Shoulder External Rotation 35 Degrees          [facility] Adult PT Treatment/Exercise - 09/23/14 0001    Lumbar Exercises: Aerobic   UBE (Upper Arm Bike) Level 3 4 minfor 4 min back   Shoulder Exercises: Standing   External Rotation Strengthening;Left;15 reps;Theraband   Theraband Level (Shoulder External Rotation) Level 2 (Red)   Internal Rotation Strengthening;Left;15 reps;Theraband   Theraband Level (Shoulder Internal Rotation) Level 2 (Red)   Flexion --  cabinet reaching with cone middle and top shelf x 20 each   Extension Strengthening;Left;15 reps;Theraband   Theraband Level (Shoulder Extension) Level 2 (Red)   Row Both;15 reps;Theraband   Theraband Level (Shoulder Row) Level 2 (Red)   Other Standing Exercises Wall slides bilateral x 20 flexion  with end range stretch   Other Standing Exercises Wall ladder x 10 scaption   Shoulder Exercises: Pulleys   Flexion 3 minutes   ABduction 3 minutes   Modalities   Modalities Cryotherapy   Cryotherapy   Number Minutes Cryotherapy 15 Minutes   Cryotherapy Location Shoulder   Type of Cryotherapy Ice pack            PT Short Term Goals - 09/23/14 1518    PT SHORT TERM GOAL #1   Title independent with initial HEP   Status Achieved   PT SHORT TERM GOAL #2   Title report pain decrease 25% or more to improve active L Shoulder overhead to 100 degrees when allowed in protocol.   Status Achieved   PT SHORT TERM GOAL #3   Title report increased use of L arm for home tasks/ feeding/ self care by 25% or more    Status Achieved    PT SHORT TERM GOAL #4   Title no sling with ADLs   Status Achieved          PT Long Term Goals - 09/23/14 1520    PT LONG TERM GOAL #1   Title Demonstrate and/or verbalize techniques to reduce the risk of re-injury to include info on: anti-inflammatory ( RICE method).   Status On-going   PT LONG TERM GOAL #2   Title be indep with advanced HEP   Status On-going   PT LONG TERM GOAL #3   Title report pain decreased 75% or more to return to normal home tasks   Status Achieved   PT LONG TERM GOAL #4   Title Improve active range to [facility] to allow independence with self care and light to moderate home tasks   Status On-going   PT LONG TERM GOAL #5   Title returns to driving   Status On-going        Problem List Patient Active Problem List   Diagnosis Date Noted  . Tremor 09/11/2014  . Unspecified constipation 04/30/2014  . Rotator cuff (capsule) sprain 04/13/2014  . Rotator cuff tear, left 02/26/2014  . Memory deficit 02/25/2014  . Dizziness and giddiness 03/07/2013  . Headache 03/07/2013    Sherrie Mustacheonoho,  [place], PTA 09/23/2014, 3:24 PM

## 2014-09-29 ENCOUNTER — Ambulatory Visit (INDEPENDENT_AMBULATORY_CARE_PROVIDER_SITE_OTHER): Payer: Medicare Other | Admitting: Orthopedic Surgery

## 2014-09-29 ENCOUNTER — Encounter: Payer: Self-pay | Admitting: Orthopedic Surgery

## 2014-09-29 VITALS — BP 127/68 | Ht 67.5 in | Wt 165.0 lb

## 2014-09-29 DIAGNOSIS — Z9889 Other specified postprocedural states: Secondary | ICD-10-CM

## 2014-09-29 NOTE — Progress Notes (Signed)
Patient ID: [name], female   DOB: Nov 22, 1945, 68 y.o.   MRN: [id number] Postop rotator cuff repair revision after patient fell during her colonoscopy  Patient has 120 afford elevations taken ibuprofen using Voltaren gel for pain. Her external rotation is limited to 30 afford elevation is 120 she has a normal drop test are post and then at home and follow-up in 3 months for her 6 month postop visit

## 2014-09-29 NOTE — Patient Instructions (Addendum)
Continue therapeutic exercises with therapist for 2 more weeks  to increase strength return in 3 months

## 2014-10-12 ENCOUNTER — Ambulatory Visit: Payer: Medicare Other | Attending: Orthopedic Surgery | Admitting: Physical Therapy

## 2014-10-12 DIAGNOSIS — M6530 Trigger finger, unspecified finger: Secondary | ICD-10-CM | POA: Diagnosis not present

## 2014-10-12 DIAGNOSIS — R293 Abnormal posture: Secondary | ICD-10-CM | POA: Insufficient documentation

## 2014-10-12 DIAGNOSIS — M75102 Unspecified rotator cuff tear or rupture of left shoulder, not specified as traumatic: Secondary | ICD-10-CM | POA: Diagnosis not present

## 2014-10-12 DIAGNOSIS — S43422D Sprain of left rotator cuff capsule, subsequent encounter: Secondary | ICD-10-CM

## 2014-10-12 DIAGNOSIS — M25629 Stiffness of unspecified elbow, not elsewhere classified: Secondary | ICD-10-CM | POA: Diagnosis not present

## 2014-10-12 DIAGNOSIS — M25519 Pain in unspecified shoulder: Secondary | ICD-10-CM | POA: Insufficient documentation

## 2014-10-12 DIAGNOSIS — M75100 Unspecified rotator cuff tear or rupture of unspecified shoulder, not specified as traumatic: Secondary | ICD-10-CM | POA: Diagnosis present

## 2014-10-12 DIAGNOSIS — M25512 Pain in left shoulder: Secondary | ICD-10-CM

## 2014-10-12 DIAGNOSIS — H8112 Benign paroxysmal vertigo, left ear: Secondary | ICD-10-CM

## 2014-10-12 NOTE — Therapy (Signed)
[facility] Rehabilitation [facility] [address] [place], [place], [address] Phone: [phone]   Fax:  [phone]  Physical Therapy Treatment  Patient Details  Name: Beth Meza MRN: [id number] Date of Birth: 1946-08-21  Encounter Date: 10/12/2014      PT End of Session - 10/12/14 1505    Visit Number 16   Number of Visits 24   Date for PT Re-Evaluation 11/11/14   PT Start Time 1415   PT Stop Time 1458   PT Time Calculation (min) 43 min   Activity Tolerance Patient tolerated treatment well   Behavior During Therapy [facility] for tasks assessed/performed      Past Medical History  Diagnosis Date  . Back spasm   . Hand cramps   . Leg cramps   . Dizziness and giddiness 03/07/2013  . Headache(784.0) 03/07/2013  . Gastroesophageal reflux disease   . Degenerative arthritis   . Hypertension   . Dyslipidemia   . History of carpal tunnel syndrome     Bilateral  . Diabetes     diet controlled  . Memory deficit 02/25/2014  . Seizures     as child- unknown etiology-no meds  . Anemia     Past Surgical History  Procedure Laterality Date  . Back surgery    . Knee surgery Left     Pin, history of fracture  . Ankle surgery Left     History of ankle fracture  . Hand surgery      Carpal tunnel syndrome, trigger fingers bilaterally  . Shoulder open rotator cuff repair Left 04/08/2014    Dr. Romeo Apple  . Colonoscopy N/A 05/04/2014    Procedure: COLONOSCOPY;  Surgeon: West Bali, MD;  [facility];  Service: Endoscopy;  Laterality: N/A;  11:30  . Shoulder open rotator cuff repair Left 06/10/2014    Procedure: REVISION ROTATOR CUFF REPAIR SHOULDER OPEN WITH GRAFT;  Surgeon: Vickki Hearing, MD;  Location: [facility];  Service: Orthopedics;  Laterality: Left;  . Shoulder acromioplasty Left 06/10/2014    Procedure: SHOULDER ACROMIOPLASTY;  Surgeon: Vickki Hearing, MD;  Location: [facility];  Service: Orthopedics;  Laterality: Left;    There were  no vitals taken for this visit.  Visit Diagnosis:  Rotator cuff (capsule) sprain, left, subsequent encounter - Plan: PT plan of care cert/re-cert  Pain in joint, shoulder region, left - Plan: PT plan of care cert/re-cert  Left shoulder pain - Plan: PT plan of care cert/re-cert  BPPV (benign paroxysmal positional vertigo), left - Plan: PT plan of care cert/re-cert      Subjective Assessment - 10/12/14 1420    Symptoms L shoulder feels good; still has trouble holding arm up in air   Pertinent History L shoulder RTC repair in July 2015, then repair again 3 weeks later due to a fall and reinjury.    Limitations Lifting;House hold activities   Patient Stated Goals improve function and decrease pain   Currently in Pain? No/denies              Vestibular Assessment - 10/12/14 1501    Type of Dizziness Spinning   Sidelying Test Sidelying Right;Sidelying Left   Horizontal Canal Testing Horizontal Canal Right;Horizontal Canal Left   Sidelying Right Symptoms No nystagmus   Sidelying Left Duration ~ 5 sec; pt unable to tolerate and removed self from testing position   Sidelying Left Symptoms Upbeat, left rotatory nystagmus   Horizontal Canal Right Symptoms Normal   Horizontal Canal  Left Symptoms Normal              [facility] Adult PT Treatment/Exercise - 10/12/14 1421    Shoulder Exercises: ROM/Strengthening   UBE (Upper Arm Bike) 8 min level 3.0; 4 min forward/4 min backwards   Shoulder Exercises: Isometric Strengthening   External Rotation 5X5"   External Rotation Limitations significant cues for correct technique   Internal Rotation 5X5"   Internal Rotation Limitations significant cues for correct technique         Vestibular Treatment/Exercise - 10/12/14 1500    Vestibular Treatment Provided Canalith Repositioning   Canalith Repositioning Epley Manuever Left   Number of Reps  1   Overall Response  Symptoms Resolved    RESPONSE DETAILS LEFT no symptoms with dix  hallpike testing after repositioning; 3-5 beats upbeating rotarty nystagmus with transition from 1st position to 2nd position (L head turn to R head turn); negative R dix hallpike after repositioning.               PT Education - 10/12/14 1503    Education provided Yes   Education Details BPPV   Person(s) Educated Patient   Methods Explanation;Handout   Comprehension Verbalized understanding          PT Short Term Goals - 10/12/14 1513    PT SHORT TERM GOAL #1   Title independent with initial HEP   Status Achieved   PT SHORT TERM GOAL #2   Title report pain decrease 25% or more to improve active L Shoulder overhead to 100 degrees when allowed in protocol.   Status Achieved   PT SHORT TERM GOAL #3   Title report increased use of L arm for home tasks/ feeding/ self care by 25% or more    Status Achieved   PT SHORT TERM GOAL #4   Title no sling with ADLs   Status Achieved           PT Long Term Goals - 10/12/14 1514    PT LONG TERM GOAL #1   Title Demonstrate and/or verbalize techniques to reduce the risk of re-injury to include info on: anti-inflammatory ( RICE method).   Status Achieved   PT LONG TERM GOAL #2   Title be indep with advanced HEP (10/26/14)   Time 2   Period Weeks   Status On-going   PT LONG TERM GOAL #3   Title report pain decreased 75% or more to return to normal home tasks   Status Achieved   PT LONG TERM GOAL #4   Title Improve active range to [facility] to allow independence with self care and light to moderate home tasks (10/26/14)   Time 2   Period Weeks   Status On-going   PT LONG TERM GOAL #5   Title returns to driving   Status Achieved               Plan - 10/12/14 1505    Clinical Impression Statement Pt reported history of vertigo and increase in symptoms past couple days.  Quick vestibular assessment revealed L posterior canal BPPV.  Treated vertigo in clinic today with negative testing following canalith repositioning.  Will  plan to treat as needed as vertigo has significant effect on pt's mobility and safety.  Overall shoulder improving and pt able to return to most functional activities.  Will plan to see initial 1-2 weeks to address any remaining deficits and ensure smooth transition to home program.   Pt will benefit from skilled  therapeutic intervention in order to improve on the following deficits Decreased range of motion;Decreased strength;Impaired UE functional use;Pain;Other (comment)  vertigo   Rehab Potential Good   Clinical Impairments Affecting Rehab Potential MD cleared pt for active asssited motion and progressive stengthening   PT Frequency 2x / week   PT Duration 3 weeks   PT Treatment/Interventions Therapeutic activities;Patient/family education;Therapeutic exercise;Manual techniques;Cryotherapy;Passive range of motion;Other (comment)  canalith repositioning   PT Next Visit Plan continue ROM and strengthening, asses vestibular if needed   PT Home Exercise Plan progress strength as able   Consulted and Agree with Plan of Care Patient   PT Plan Continue AAROM, AROM, gentle resistance as tolerated,   Measure range        Problem List Patient Active Problem List   Diagnosis Date Noted  . Tremor 09/11/2014  . Unspecified constipation 04/30/2014  . Rotator cuff (capsule) sprain 04/13/2014  . Rotator cuff tear, left 02/26/2014  . Memory deficit 02/25/2014  . Dizziness and giddiness 03/07/2013  . Headache 03/07/2013   Clarita Crane, PT, DPT 10/12/2014 3:25 PM  [facility] [address] [place], [place], [address] Phone: [phone]   Fax:  [phone]

## 2014-10-14 ENCOUNTER — Ambulatory Visit: Payer: Medicare Other | Admitting: Rehabilitation

## 2014-10-14 DIAGNOSIS — H8112 Benign paroxysmal vertigo, left ear: Secondary | ICD-10-CM

## 2014-10-14 DIAGNOSIS — S43422D Sprain of left rotator cuff capsule, subsequent encounter: Secondary | ICD-10-CM

## 2014-10-14 DIAGNOSIS — S43422A Sprain of left rotator cuff capsule, initial encounter: Secondary | ICD-10-CM

## 2014-10-14 DIAGNOSIS — M75102 Unspecified rotator cuff tear or rupture of left shoulder, not specified as traumatic: Secondary | ICD-10-CM | POA: Diagnosis not present

## 2014-10-14 DIAGNOSIS — M25512 Pain in left shoulder: Secondary | ICD-10-CM

## 2014-10-14 NOTE — Therapy (Signed)
[facility] Outpatient Rehabilitation [facility] [address] [place], [place], [address] Phone: [phone]   Fax:  [phone]  Physical Therapy Treatment  Patient Details  Name: Beth Meza MRN: [id number] Date of Birth: 1946-07-17  Encounter Date: 10/14/2014      PT End of Session - 10/14/14 1413    Visit Number 17   Number of Visits 24   Date for PT Re-Evaluation 11/11/14   PT Start Time 0206   PT Stop Time 0248   PT Time Calculation (min) 42 min      Past Medical History  Diagnosis Date  . Back spasm   . Hand cramps   . Leg cramps   . Dizziness and giddiness 03/07/2013  . Headache(784.0) 03/07/2013  . Gastroesophageal reflux disease   . Degenerative arthritis   . Hypertension   . Dyslipidemia   . History of carpal tunnel syndrome     Bilateral  . Diabetes     diet controlled  . Memory deficit 02/25/2014  . Seizures     as child- unknown etiology-no meds  . Anemia     Past Surgical History  Procedure Laterality Date  . Back surgery    . Knee surgery Left     Pin, history of fracture  . Ankle surgery Left     History of ankle fracture  . Hand surgery      Carpal tunnel syndrome, trigger fingers bilaterally  . Shoulder open rotator cuff repair Left 04/08/2014    Dr. Romeo Apple  . Colonoscopy N/A 05/04/2014    Procedure: COLONOSCOPY;  Surgeon: West Bali, MD;  [facility];  Service: Endoscopy;  Laterality: N/A;  11:30  . Shoulder open rotator cuff repair Left 06/10/2014    Procedure: REVISION ROTATOR CUFF REPAIR SHOULDER OPEN WITH GRAFT;  Surgeon: Vickki Hearing, MD;  Location: [facility];  Service: Orthopedics;  Laterality: Left;  . Shoulder acromioplasty Left 06/10/2014    Procedure: SHOULDER ACROMIOPLASTY;  Surgeon: Vickki Hearing, MD;  Location: [facility];  Service: Orthopedics;  Laterality: Left;    There were no vitals taken for this visit.  Visit Diagnosis:  No diagnosis found.      Subjective Assessment - 10/14/14  1407    Symptoms Not much pain. A touch of vertigo last night, better today.   Pertinent History L shoulder RTC repair in July 2015, then repair again 3 weeks later due to a fall and reinjury.    Currently in Pain? No/denies   Pain Descriptors / Indicators Other (Comment)  twinge   Aggravating Factors  lifting/carrying purse   Pain Relieving Factors not lifting   Multiple Pain Sites No          [facility] Adult PT Treatment/Exercise - 10/14/14 1453    Shoulder Exercises: Standing   Flexion AROM;20 reps  With mirror feedback/tactile cues to decrease scap elevation   ABduction AROM;20 reps  with mirror feedback   Extension Strengthening;Both;10 reps;Theraband   Theraband Level (Shoulder Extension) Level 2 (Red)   Row Strengthening;10 reps;Both;Theraband   Theraband Level (Shoulder Row) Level 2 (Red)   Shoulder Exercises: Pulleys   Flexion 2 minutes   ABduction 1 minute   Shoulder Exercises: ROM/Strengthening   UBE (Upper Arm Bike) 8 min level 3.0; 4 min forward/4 min backwards   Shoulder Exercises: Stretch   Wall Stretch - Flexion 2 reps;20 seconds   Wall Stretch - ABduction 2 reps;20 seconds   Other Shoulder Stretches Wall ladder AAROM stretch with  manual scap depression                 PT Education - 10/14/14 1449    Education Details HEP for scap strength,    Person(s) Educated Patient   Methods Explanation;Handout   Comprehension Verbalized understanding          PT Short Term Goals - 10/12/14 1513    PT SHORT TERM GOAL #1   Title independent with initial HEP   Status Achieved   PT SHORT TERM GOAL #2   Title report pain decrease 25% or more to improve active L Shoulder overhead to 100 degrees when allowed in protocol.   Status Achieved   PT SHORT TERM GOAL #3   Title report increased use of L arm for home tasks/ feeding/ self care by 25% or more    Status Achieved   PT SHORT TERM GOAL #4   Title no sling with ADLs   Status Achieved           PT  Long Term Goals - 10/12/14 1514    PT LONG TERM GOAL #1   Title Demonstrate and/or verbalize techniques to reduce the risk of re-injury to include info on: anti-inflammatory ( RICE method).   Status Achieved   PT LONG TERM GOAL #2   Title be indep with advanced HEP (10/26/14)   Time 2   Period Weeks   Status On-going   PT LONG TERM GOAL #3   Title report pain decreased 75% or more to return to normal home tasks   Status Achieved   PT LONG TERM GOAL #4   Title Improve active range to [facility] to allow independence with self care and light to moderate home tasks (10/26/14)   Time 2   Period Weeks   Status On-going   PT LONG TERM GOAL #5   Title returns to driving   Status Achieved               Plan - 10/14/14 1450    Clinical Impression Statement Scapular dysfunction left. Pt able to decrease shoulder hike with mirrir feedback and strap mobilizations. Also, decreased shoulder hike with bilateral therex verses unilateral.    PT Next Visit Plan review scapular rows and extension, cont scap strength to decrease shoulder hike. Cont gentle strength, assess vestibular if needed.         Problem List Patient Active Problem List   Diagnosis Date Noted  . Tremor 09/11/2014  . Unspecified constipation 04/30/2014  . Rotator cuff (capsule) sprain 04/13/2014  . Rotator cuff tear, left 02/26/2014  . Memory deficit 02/25/2014  . Dizziness and giddiness 03/07/2013  . Headache 03/07/2013    Sherrie Mustacheonoho,  [place], PTA 10/14/2014, 2:56 PM  [facility] [address] [place], [place], [address] Phone: [phone]   Fax:  [phone]

## 2014-10-14 NOTE — Patient Instructions (Signed)
EXTENSION: Standing - Resistance Band: Stable (Active)   Stand, right arm at side. Against yellow resistance band, draw arm backward, as far as possible, keeping elbow straight. Complete _2__ sets of _10__ repetitions. Perform _2__ sessions per day.   Copyright  VHI. All rights reserved.  Resistive Band Rowing   With resistive band anchored in door, grasp both ends. Keeping elbows bent, pull back, squeezing shoulder blades together. Hold __5__ seconds. Repeat __10__ times. Do _2___ sessions per day.  http://gt2.exer.us/97   Copyright  VHI. All rights reserved.

## 2014-10-19 ENCOUNTER — Ambulatory Visit: Payer: Medicare Other | Admitting: Physical Therapy

## 2014-10-19 DIAGNOSIS — M75102 Unspecified rotator cuff tear or rupture of left shoulder, not specified as traumatic: Secondary | ICD-10-CM | POA: Diagnosis not present

## 2014-10-19 DIAGNOSIS — S43422A Sprain of left rotator cuff capsule, initial encounter: Secondary | ICD-10-CM

## 2014-10-19 DIAGNOSIS — S43422D Sprain of left rotator cuff capsule, subsequent encounter: Secondary | ICD-10-CM

## 2014-10-19 DIAGNOSIS — M25512 Pain in left shoulder: Secondary | ICD-10-CM

## 2014-10-19 DIAGNOSIS — H8112 Benign paroxysmal vertigo, left ear: Secondary | ICD-10-CM

## 2014-10-19 NOTE — Patient Instructions (Signed)
Sit to Side-Lying   Sit on edge of bed. 1. Turn head 45 to right. 2. Maintain head position and lie down slowly on left side. Hold until symptoms subside plus 30 seconds. 3. Sit up slowly. Hold until symptoms subside, plus 30 seconds. 4. Turn head 45 to left. 5. Maintain head position and lie down slowly on right side. Hold until symptoms subside, plus 30 seconds. 6. Sit up slowly. Repeat sequence __3-5__ times per session. Do __2-3__ sessions per day.  Copyright  VHI. All rights reserved.

## 2014-10-19 NOTE — Therapy (Signed)
[facility] [address] [place], [place], [address] Phone: [phone]   Fax:  [phone]  Physical Therapy Treatment  Patient Details  Name: Waldron LabsBeulah M Schlink MRN: [id number] Date of Birth: 1945-12-29 Referring Provider:  Isabella Stallingondiego, Richard M, MD  Encounter Date: 10/19/2014      PT End of Session - 10/19/14 1511    Visit Number 18   Number of Visits 24   Date for PT Re-Evaluation 11/11/14   PT Start Time 1415   PT Stop Time 1458   PT Time Calculation (min) 43 min   Activity Tolerance Patient tolerated treatment well   Behavior During Therapy [facility] for tasks assessed/performed      Past Medical History  Diagnosis Date  . Back spasm   . Hand cramps   . Leg cramps   . Dizziness and giddiness 03/07/2013  . Headache(784.0) 03/07/2013  . Gastroesophageal reflux disease   . Degenerative arthritis   . Hypertension   . Dyslipidemia   . History of carpal tunnel syndrome     Bilateral  . Diabetes     diet controlled  . Memory deficit 02/25/2014  . Seizures     as child- unknown etiology-no meds  . Anemia     Past Surgical History  Procedure Laterality Date  . Back surgery    . Knee surgery Left     Pin, history of fracture  . Ankle surgery Left     History of ankle fracture  . Hand surgery      Carpal tunnel syndrome, trigger fingers bilaterally  . Shoulder open rotator cuff repair Left 04/08/2014    Dr. Romeo AppleHarrison  . Colonoscopy N/A 05/04/2014    Procedure: COLONOSCOPY;  Surgeon: West BaliSandi L Fields, MD;  [facility];  Service: Endoscopy;  Laterality: N/A;  11:30  . Shoulder open rotator cuff repair Left 06/10/2014    Procedure: REVISION ROTATOR CUFF REPAIR SHOULDER OPEN WITH GRAFT;  Surgeon: Vickki HearingStanley E Harrison, MD;  Location: [facility];  Service: Orthopedics;  Laterality: Left;  . Shoulder acromioplasty Left 06/10/2014    Procedure: SHOULDER ACROMIOPLASTY;  Surgeon: Vickki HearingStanley E Harrison, MD;  Location: [facility];  Service:  Orthopedics;  Laterality: Left;    There were no vitals taken for this visit.  Visit Diagnosis:  Rotator cuff (capsule) sprain, left, subsequent encounter  Pain in joint, shoulder region, left  Left shoulder pain  BPPV (benign paroxysmal positional vertigo), left  Rotator cuff (capsule) sprain, left, initial encounter      Subjective Assessment - 10/19/14 1417    Symptoms still has vertigo   Pertinent History L shoulder RTC repair in July 2015, then repair again 3 weeks later due to a fall and reinjury.    Limitations Lifting;House hold activities   Patient Stated Goals improve function and decrease pain   Currently in Pain? No/denies              Vestibular Assessment - 10/19/14 1429    Type of Dizziness Spinning   Dix-Hallpike Dix-Hallpike Right;Dix-Hallpike Left   Sidelying Test Sidelying Right;Sidelying Left   Horizontal Canal Testing Horizontal Canal Right;Horizontal Canal Left   Dix-Hallpike Right Duration ~ 7 sec   Dix-Hallpike Right Symptoms Upbeat, right rotatory nystagmus  difficult to assess as pt closed eyes and tried to sit   Sidelying Right Symptoms No nystagmus   Sidelying Left Symptoms No nystagmus   Horizontal Canal Right Symptoms Normal   Horizontal Canal Left Symptoms Normal   BP supine (  x 5 minutes) 116/68 mmHg   HR supine (x 5 minutes) 63   BP standing (after 1 minute) 92/62 mmHg   HR standing (after 1 minute) 84   BP standing (after 3 minutes) 98/68 mmHg   HR standing (after 3 minutes) 81   Orthostatics Comment educated pt on signs/symptoms and recommendations for orthostatic hypotension.  Pt. with positive findings and recommended she follow up with neurologist and primary MD              [facility] PT Treatment/Exercise - 10/19/14 1449    Shoulder Exercises: ROM/Strengthening   UBE (Upper Arm Bike) 8 min level 3.0; 4 min forward/4 min backwards         Vestibular Treatment/Exercise - 10/19/14 1509    Vestibular Treatment  Provided Canalith Repositioning   Canalith Repositioning Epley Manuever Right   Number of Reps  1   Overall Response No change   Response Details  pt removed self from R dix hallpike testing position; when returned to testing position for R epley's no symptoms or nystagmus               PT Education - 10/19/14 1511    Education provided Yes   Education Details orthostatic hypotension and recommendations   Person(s) Educated Patient   Methods Explanation;Handout   Comprehension Verbalized understanding          PT Short Term Goals - 10/12/14 1513    PT SHORT TERM GOAL #1   Title independent with initial HEP   Status Achieved   PT SHORT TERM GOAL #2   Title report pain decrease 25% or more to improve active L Shoulder overhead to 100 degrees when allowed in protocol.   Status Achieved   PT SHORT TERM GOAL #3   Title report increased use of L arm for home tasks/ feeding/ self care by 25% or more    Status Achieved   PT SHORT TERM GOAL #4   Title no sling with ADLs   Status Achieved           PT Long Term Goals - 10/12/14 1514    PT LONG TERM GOAL #1   Title Demonstrate and/or verbalize techniques to reduce the risk of re-injury to include info on: anti-inflammatory ( RICE method).   Status Achieved   PT LONG TERM GOAL #2   Title be indep with advanced HEP (10/26/14)   Time 2   Period Weeks   Status On-going   PT LONG TERM GOAL #3   Title report pain decreased 75% or more to return to normal home tasks   Status Achieved   PT LONG TERM GOAL #4   Title Improve active range to [facility] to allow independence with self care and light to moderate home tasks (10/26/14)   Time 2   Period Weeks   Status On-going   PT LONG TERM GOAL #5   Title returns to driving   Status Achieved               Plan - 10/19/14 1512    Clinical Impression Statement Pt continues to demonstrate vertigo however unable to tolerate testing postitions and closes eyes shut making it  diffiuclt to fully assess direction of nystagmus.  Will plan for d/c next visit.   PT Next Visit Plan check goals and d/c, foto, gcode, give Brandt-Daroff (HEP for 10/19/14) if vertigo continues   Consulted and Agree with Plan of Care Patient  Problem List Patient Active Problem List   Diagnosis Date Noted  . Tremor 09/11/2014  . Unspecified constipation 04/30/2014  . Rotator cuff (capsule) sprain 04/13/2014  . Rotator cuff tear, left 02/26/2014  . Memory deficit 02/25/2014  . Dizziness and giddiness 03/07/2013  . Headache 03/07/2013   Clarita Crane, PT, DPT 10/19/2014 3:20 PM  [facility] [address] [place], [place], [address] Phone: [phone]   Fax:  [phone]

## 2014-10-20 ENCOUNTER — Telehealth: Payer: Self-pay | Admitting: Neurology

## 2014-10-20 MED ORDER — TOPIRAMATE ER 50 MG PO CAP24: 50.0000 mg | ORAL_CAPSULE | Freq: Every day | ORAL | Status: DC

## 2014-10-20 NOTE — Telephone Encounter (Signed)
Patient is calling about new Rx Topiramate. Vertigo has come back.  Patient has cut Rx dosage in half and memory is better. Please call patient to discuss

## 2014-10-20 NOTE — Telephone Encounter (Signed)
I called the patient. The patient has had improved cognition on the lower dose of Topamax at 25 mg at night. However, the symptoms of dizziness have returned. We may try converting over to Trokendi to see if this helps her symptoms at 50 mg, but does not have as much in the way of cognitive side effects. I have called in a prescription.

## 2014-10-21 ENCOUNTER — Ambulatory Visit: Payer: Medicare Other | Admitting: Rehabilitation

## 2014-10-21 DIAGNOSIS — M25512 Pain in left shoulder: Secondary | ICD-10-CM

## 2014-10-21 DIAGNOSIS — H8112 Benign paroxysmal vertigo, left ear: Secondary | ICD-10-CM

## 2014-10-21 DIAGNOSIS — S43422A Sprain of left rotator cuff capsule, initial encounter: Secondary | ICD-10-CM

## 2014-10-21 DIAGNOSIS — M75102 Unspecified rotator cuff tear or rupture of left shoulder, not specified as traumatic: Secondary | ICD-10-CM | POA: Diagnosis not present

## 2014-10-21 DIAGNOSIS — S43422D Sprain of left rotator cuff capsule, subsequent encounter: Secondary | ICD-10-CM

## 2014-10-21 NOTE — Patient Instructions (Signed)
Strengthening: Resisted Internal Rotation   Hold tubing in left hand, elbow at side and forearm out. Rotate forearm in across body. Repeat __10__ times per set. Do _1-2___ sets per session. Do ___2_ sessions per day.  http://orth.exer.us/830   Copyright  VHI. All rights reserved.  Strengthening: Resisted External Rotation   Hold tubing in right hand, elbow at side and forearm across body. Rotate forearm out. Repeat __10__ times per set. Do 1-2___ sets per session. Do __2__ sessions per day.  http://orth.exer.us/828

## 2014-10-21 NOTE — Therapy (Signed)
[place], [place], [address] Phone: [phone]   Fax:  [phone]  Physical Therapy Treatment  Patient Details  Name: Beth Meza MRN: [id number] Date of Birth: Mar 01, 1946 Referring Provider:  Maricela Curet, MD  Encounter Date: 10/21/2014      PT End of Session - 10/21/14 1556    Visit Number 19   Number of Visits 24   Date for PT Re-Evaluation 11/11/14   PT Start Time 0215   PT Stop Time 0300   PT Time Calculation (min) 45 min      Past Medical History  Diagnosis Date  . Back spasm   . Hand cramps   . Leg cramps   . Dizziness and giddiness 03/07/2013  . Headache(784.0) 03/07/2013  . Gastroesophageal reflux disease   . Degenerative arthritis   . Hypertension   . Dyslipidemia   . History of carpal tunnel syndrome     Bilateral  . Diabetes     diet controlled  . Memory deficit 02/25/2014  . Seizures     as child- unknown etiology-no meds  . Anemia     Past Surgical History  Procedure Laterality Date  . Back surgery    . Knee surgery Left     Pin, history of fracture  . Ankle surgery Left     History of ankle fracture  . Hand surgery      Carpal tunnel syndrome, trigger fingers bilaterally  . Shoulder open rotator cuff repair Left 04/08/2014    Dr. Aline Brochure  . Colonoscopy N/A 05/04/2014    Procedure: COLONOSCOPY;  Surgeon: Danie Binder, MD;  [facility];  Service: Endoscopy;  Laterality: N/A;  11:30  . Shoulder open rotator cuff repair Left 06/10/2014    Procedure: REVISION ROTATOR CUFF REPAIR SHOULDER OPEN WITH GRAFT;  Surgeon: Carole Civil, MD;  Location: [facility];  Service: Orthopedics;  Laterality: Left;  . Shoulder acromioplasty Left 06/10/2014    Procedure: SHOULDER ACROMIOPLASTY;  Surgeon: Carole Civil, MD;  Location: [facility];  Service: Orthopedics;  Laterality: Left;    There were no vitals taken for this visit.  Visit Diagnosis:  No diagnosis  found.      Subjective Assessment - 10/21/14 1432    Symptoms I rolled over on my shoulder hard last night and it is hurting down lower in the muscle, but it is okay   Pain Score 4    Pain Location Arm  upper left   Pain Orientation Left   Pain Descriptors / Indicators Sore   Aggravating Factors  carrying grocery bags with LUE   Pain Relieving Factors not lifting or trying to reach too high          [facility] PT Assessment - 10/21/14 1459    Observation/Other Assessments   Focus on Therapeutic Outcomes (FOTO)  33% limited   AROM   Left Shoulder Flexion 120 Degrees  scaption   Left Shoulder ABduction 115 Degrees   Left Shoulder Internal Rotation 55 Degrees   Left Shoulder External Rotation 40 Degrees   Strength   Overall Strength --  grossly 3+/5 to 4-/5 left shoulder                  [facility] Adult PT Treatment/Exercise - 10/21/14 1440    Shoulder Exercises: Standing   Extension Both;15 reps;Theraband   Theraband Level (Shoulder Extension) Level 2 (Red)   Row Both;15 reps;Theraband   Theraband Level (Shoulder  Row) Level 2 (Red)   Other Standing Exercises Cabinet reach 0#, 1 #, 2# all 15 each without increased pain.   Other Standing Exercises [place] x20 each with red band   Shoulder Exercises: Pulleys   Flexion 2 minutes   ABduction 2 minutes   Shoulder Exercises: ROM/Strengthening   UBE (Upper Arm Bike) 8 min level 3.0; 4 min forward/4 min backwards                PT Education - 10/21/14 1458    Education provided Yes   Education Details [place] for ER /IR   Person(s) Educated Patient   Methods Explanation;Handout   Comprehension Verbalized understanding          PT Short Term Goals - 10/12/14 1513    PT SHORT TERM GOAL #1   Title independent with initial HEP   Status Achieved   PT SHORT TERM GOAL #2   Title report pain decrease 25% or more to improve active L Shoulder overhead to 100 degrees when allowed in protocol.   Status Achieved    PT SHORT TERM GOAL #3   Title report increased use of L arm for home tasks/ feeding/ self care by 25% or more    Status Achieved   PT SHORT TERM GOAL #4   Title no sling with ADLs   Status Achieved           PT Long Term Goals - 10/21/14 1516    PT LONG TERM GOAL #1   Title Demonstrate and/or verbalize techniques to reduce the risk of re-injury to include info on: anti-inflammatory ( RICE method).   Status Achieved   PT LONG TERM GOAL #2   Title be indep with advanced HEP (10/26/14)   Status Achieved   PT LONG TERM GOAL #3   Title report pain decreased 75% or more to return to normal home tasks   Status Achieved   PT LONG TERM GOAL #4   Title Improve active range to [facility] to allow independence with self care and light to moderate home tasks (10/26/14)   Status Achieved   PT LONG TERM GOAL #5   Title returns to driving   Status Achieved               Plan - 10/21/14 1523    Clinical Impression Statement All LTG's MET, Pt has returned to most light to moderate housework and is aware of risk of re-injury. She is independent with HEP to continue strengthening and maintain AROM.   PT Next Visit Plan Discharge today        Problem List Patient Active Problem List   Diagnosis Date Noted  . Tremor 09/11/2014  . Unspecified constipation 04/30/2014  . Rotator cuff (capsule) sprain 04/13/2014  . Rotator cuff tear, left 02/26/2014  . Memory deficit 02/25/2014  . Dizziness and giddiness 03/07/2013  . Headache 03/07/2013    Dorene Ar 10/21/2014, 3:57 PM  [place], [place], [address] Phone: [phone]   Fax:  [phone]    PHYSICAL THERAPY DISCHARGE SUMMARY  Visits from Start of Care: 19  Current functional level related to goals / functional outcomes: See above, all goals met   Remaining deficits: Pt reports 33% limited based on FOTO assessment; still with ROM deficits  but not affecting function at this time.   Education / Equipment: HEP  Plan: Patient agrees to discharge.  Patient goals were met. Patient is being  discharged due to meeting the stated rehab goals.  ?????        Laureen Abrahams, PT, DPT 10/22/2014 10:04 AM  [facility] [address],  [address]  [phone] (office) [phone] (fax)

## 2014-10-22 DIAGNOSIS — M75102 Unspecified rotator cuff tear or rupture of left shoulder, not specified as traumatic: Secondary | ICD-10-CM | POA: Diagnosis not present

## 2014-11-16 ENCOUNTER — Telehealth: Payer: Self-pay | Admitting: Neurology

## 2014-11-16 MED ORDER — TOPIRAMATE 25 MG PO TABS: 25.0000 mg | ORAL_TABLET | Freq: Two times a day (BID) | ORAL | Status: DC

## 2014-11-16 NOTE — Telephone Encounter (Signed)
I called patient. The Trokendi is not effective, and it is too expensive, the insurance does not cover the medication. I will try going back to the Topamax 25 mg tablets in split dosing this taking 25 mg twice daily instead of 50 mg at night. If this continues to cause cognitive side effects, we may switch to Zonegran.

## 2014-11-16 NOTE — Telephone Encounter (Signed)
Patient is calling in regard to Rx Trokendisal mg 50.  She is concerned because her vertigo has come back with this medication.  Also [employer] eff 10/09/14 will not cover this Rx  unless the doctor contacts them to "make exception."  Please call.

## 2014-11-17 ENCOUNTER — Telehealth: Payer: Self-pay | Admitting: *Deleted

## 2014-11-17 NOTE — Telephone Encounter (Signed)
I called the patient. The patient is still having symptoms on the Trokendi, she is to stop the medication, go back to the Topamax taking 25 mg twice daily. If she continues to have significant cognitive issues, we may try another medication such as Zonegran.

## 2014-11-17 NOTE — Telephone Encounter (Signed)
Patient calling having issues with vertigo. Patient is really dizzy today. Please advise

## 2014-12-29 ENCOUNTER — Ambulatory Visit (INDEPENDENT_AMBULATORY_CARE_PROVIDER_SITE_OTHER): Payer: Medicare Other | Admitting: Orthopedic Surgery

## 2014-12-29 ENCOUNTER — Encounter: Payer: Self-pay | Admitting: Orthopedic Surgery

## 2014-12-29 VITALS — BP 121/71 | Ht 67.5 in | Wt 163.0 lb

## 2014-12-29 DIAGNOSIS — Z9889 Other specified postprocedural states: Secondary | ICD-10-CM | POA: Diagnosis not present

## 2014-12-29 MED ORDER — IBUPROFEN 800 MG PO TABS: 800.0000 mg | ORAL_TABLET | Freq: Three times a day (TID) | ORAL | Status: DC

## 2014-12-29 NOTE — Addendum Note (Signed)
Addended by: Adella HareBOOTHE,  B on: 12/29/2014 03:09 PM   Modules accepted: Orders

## 2014-12-29 NOTE — Progress Notes (Signed)
Chief Complaint  Patient presents with  . Follow-up    3 month follow up Left RCR, DOS 06/10/14    6 months since her repeat rotator cuff surgery after she fell after her colonoscopy approximately one month after the index procedure. She has 130 of flexion she can do all activities including yard work and is having minimal discomfort in her shoulder which requires 1-2 ibuprofen per day  No further treatment is necessary she should get stronger as the months go on and she can follow-up with us on an as-needed basis.

## 2015-05-26 ENCOUNTER — Ambulatory Visit (INDEPENDENT_AMBULATORY_CARE_PROVIDER_SITE_OTHER): Payer: Medicare Other | Admitting: Neurology

## 2015-05-26 ENCOUNTER — Encounter: Payer: Self-pay | Admitting: Neurology

## 2015-05-26 VITALS — BP 112/70 | HR 67 | Ht 67.0 in | Wt 152.5 lb

## 2015-05-26 DIAGNOSIS — G441 Vascular headache, not elsewhere classified: Secondary | ICD-10-CM

## 2015-05-26 DIAGNOSIS — R251 Tremor, unspecified: Secondary | ICD-10-CM | POA: Diagnosis not present

## 2015-05-26 DIAGNOSIS — R413 Other amnesia: Secondary | ICD-10-CM | POA: Diagnosis not present

## 2015-05-26 DIAGNOSIS — R42 Dizziness and giddiness: Secondary | ICD-10-CM

## 2015-05-26 MED ORDER — VENLAFAXINE HCL ER 37.5 MG PO CP24: ORAL_CAPSULE | ORAL | Status: DC

## 2015-05-26 NOTE — Patient Instructions (Addendum)
We will start you on Effexor for the headache. We will come down off of the Topamax, going to one tablet daily for one week, and then stop the medication. We will set you up for a physical therapy evaluation for the dizziness for vestibular rehabilitation. We will follow-up in several months.   Migraine Headache A migraine headache is an intense, throbbing pain on one or both sides of your head. A migraine can last for 30 minutes to several hours. CAUSES  The exact cause of a migraine headache is not always known. However, a migraine may be caused when nerves in the brain become irritated and release chemicals that cause inflammation. This causes pain. Certain things may also trigger migraines, such as:  Alcohol.  Smoking.  Stress.  Menstruation.  Aged cheeses.  Foods or drinks that contain nitrates, glutamate, aspartame, or tyramine.  Lack of sleep.  Chocolate.  Caffeine.  Hunger.  Physical exertion.  Fatigue.  Medicines used to treat chest pain (nitroglycerine), birth control pills, estrogen, and some blood pressure medicines. SIGNS AND SYMPTOMS  Pain on one or both sides of your head.  Pulsating or throbbing pain.  Severe pain that prevents daily activities.  Pain that is aggravated by any physical activity.  Nausea, vomiting, or both.  Dizziness.  Pain with exposure to bright lights, loud noises, or activity.  General sensitivity to bright lights, loud noises, or smells. Before you get a migraine, you may get warning signs that a migraine is coming (aura). An aura may include:  Seeing flashing lights.  Seeing bright spots, halos, or zigzag lines.  Having tunnel vision or blurred vision.  Having feelings of numbness or tingling.  Having trouble talking.  Having muscle weakness. DIAGNOSIS  A migraine headache is often diagnosed based on:  Symptoms.  Physical exam.  A CT scan or MRI of your head. These imaging tests cannot diagnose migraines,  but they can help rule out other causes of headaches. TREATMENT Medicines may be given for pain and nausea. Medicines can also be given to help prevent recurrent migraines.  HOME CARE INSTRUCTIONS  Only take over-the-counter or prescription medicines for pain or discomfort as directed by your health care provider. The use of long-term narcotics is not recommended.  Lie down in a dark, quiet room when you have a migraine.  Keep a journal to find out what may trigger your migraine headaches. For example, write down:  What you eat and drink.  How much sleep you get.  Any change to your diet or medicines.  Limit alcohol consumption.  Quit smoking if you smoke.  Get 7-9 hours of sleep, or as recommended by your health care provider.  Limit stress.  Keep lights dim if bright lights bother you and make your migraines worse. SEEK IMMEDIATE MEDICAL CARE IF:   Your migraine becomes severe.  You have a fever.  You have a stiff neck.  You have vision loss.  You have muscular weakness or loss of muscle control.  You start losing your balance or have trouble walking.  You feel faint or pass out.  You have severe symptoms that are different from your first symptoms. MAKE SURE YOU:   Understand these instructions.  Will watch your condition.  Will get help right away if you are not doing well or get worse. Document Released: 09/25/2005 Document Revised: 02/09/2014 Document Reviewed: 06/02/2013 [facility] Patient Information 2015 [place], [place]. This information is not intended to replace advice given to you by your health care  provider. Make sure you discuss any questions you have with your health care provider.

## 2015-05-26 NOTE — Progress Notes (Signed)
Reason for visit: Headache  Beth Meza is an 69 y.o. female  History of present illness:  Ms. Beth Meza is a 69 year old right-handed black female with history of frequent headaches. The patient recently was diagnosed with diabetes in March 2016, she is on metformin at this time. She indicates that after she went on metformin, her headaches seem to increase in frequency. She has had a very poor appetite with some weight loss starting the medication, and she has started supplementing her diet with Boost. The patient has had increased problems with dizziness over the last month, she will have vertigo when she lies down and rolls to one side or the other, and she indicates that she is not sleeping well at night. The patient will have some occasional nausea with the vertigo. The patient denies any recent falls. She has headaches mainly in the frontal areas. She continues to have some word finding problems on the Topamax, split dosing the medication has not helped. The patient could not get much benefit with Trokendi and she stopped the medication.   Past Medical History  Diagnosis Date  . Back spasm   . Hand cramps   . Leg cramps   . Dizziness and giddiness 03/07/2013  . Headache(784.0) 03/07/2013  . Gastroesophageal reflux disease   . Degenerative arthritis   . Hypertension   . Dyslipidemia   . History of carpal tunnel syndrome     Bilateral  . Diabetes     diet controlled  . Memory deficit 02/25/2014  . Seizures     as child- unknown etiology-no meds  . Anemia     Past Surgical History  Procedure Laterality Date  . Back surgery    . Knee surgery Left     Pin, history of fracture  . Ankle surgery Left     History of ankle fracture  . Hand surgery      Carpal tunnel syndrome, trigger fingers bilaterally  . Shoulder open rotator cuff repair Left 04/08/2014    Dr. Romeo Meza  . Colonoscopy N/A 05/04/2014    Procedure: COLONOSCOPY;  Surgeon: West Bali, MD;  [facility];  Service: Endoscopy;  Laterality: N/A;  11:30  . Shoulder open rotator cuff repair Left 06/10/2014    Procedure: REVISION ROTATOR CUFF REPAIR SHOULDER OPEN WITH GRAFT;  Surgeon: Beth Hearing, MD;  Location: [facility];  Service: Orthopedics;  Laterality: Left;  . Shoulder acromioplasty Left 06/10/2014    Procedure: SHOULDER ACROMIOPLASTY;  Surgeon: Beth Hearing, MD;  Location: [facility];  Service: Orthopedics;  Laterality: Left;    Family History  Problem Relation Age of Onset  . Heart attack Father   . Colon cancer Neg Hx     Social history:  reports that she has never smoked. She has never used smokeless tobacco. She reports that she does not drink alcohol or use illicit drugs.   No Known Allergies  Medications:  Prior to Admission medications   Medication Sig Start Date End Date Taking? Authorizing Provider  amLODipine (NORVASC) 5 MG tablet Take 5 mg by mouth daily.   Yes Historical Provider, MD  ferrous sulfate 325 (65 FE) MG tablet Take 325 mg by mouth daily with breakfast.   Yes Historical Provider, MD  ibuprofen (ADVIL,MOTRIN) 800 MG tablet Take 1 tablet (800 mg total) by mouth 3 (three) times daily. 12/29/14  Yes Beth Hearing, MD  lisinopril-hydrochlorothiazide (PRINZIDE,ZESTORETIC) 20-25 MG per tablet Take 1 tablet by mouth daily.  Yes Historical Provider, MD  metFORMIN (GLUCOPHAGE) 500 MG tablet Take 500 mg by mouth 2 (two) times daily. 05/04/15  Yes Historical Provider, MD  Multiple Vitamin (MULTIVITAMIN) tablet Take 1 tablet by mouth daily.   Yes Historical Provider, MD  Omega-3 Fatty Acids (FISH OIL) 1000 MG CAPS Take 1 capsule by mouth daily.   Yes Historical Provider, MD  pravastatin (PRAVACHOL) 40 MG tablet Take 40 mg by mouth daily. 02/11/14  Yes Historical Provider, MD  ranitidine (ZANTAC) 150 MG capsule Take 150 mg by mouth 2 (two) times daily.   Yes Historical Provider, MD  topiramate (TOPAMAX) 25 MG tablet Take 1 tablet (25 mg total) by mouth 2 (two) times  daily. 11/16/14  Yes Beth Spaniel, MD  vitamin C (ASCORBIC ACID) 500 MG tablet Take 500 mg by mouth daily.   Yes Historical Provider, MD    ROS:  Out of a complete 14 system review of symptoms, the patient complains only of the following symptoms, and all other reviewed systems are negative.  Fatigue, fever Meza loss Double vision, blurred vision Constipation Achy muscles Itching Memory loss, dizziness, headache, numbness, speech difficulty, weakness  Blood pressure 112/70, pulse 67, height 5\' 7"  (1.702 m), weight 152 lb 8 oz (69.174 kg).  Physical Exam  General: The patient is alert and cooperative at the time of the examination.  Skin: No significant peripheral edema is noted.   Neurologic Exam  Mental status: The patient is alert and oriented x 3 at the time of the examination. The patient has apparent normal recent and remote memory, with an apparently normal attention span and concentration ability. Mini-Mental Status Examination done today shows a total score 27/30. The patient is able to name 7 animals in 30 seconds.   Cranial nerves: Facial symmetry is present. Speech is normal, no aphasia or dysarthria is noted. Extraocular movements are full. Visual fields are full.  Motor: The patient has good strength in all 4 extremities.  Sensory examination: Soft touch sensation is symmetric on the face, arms, and legs.  Coordination: The patient has good finger-nose-finger and heel-to-shin bilaterally. The Nyan-Barrany procedure was negative. At times, mild rotatory nystagmus is seen.  Gait and station: The patient has a normal gait. Tandem gait is minimally unsteady. Romberg is negative. No drift is seen.  Reflexes: Deep tendon reflexes are symmetric.   MRI brain 11/26/12:  IMPRESSION:  1. Scattered subcortical to hyperintensities are stable, but slightly advanced for age. The finding is nonspecific but can be seen in the setting of chronic microvascular  ischemia, a demyelinating process such as multiple sclerosis, vasculitis, complicated migraine headaches, or as the sequelae of a prior infectious or inflammatory process. 2. No acute focal intracranial abnormality otherwise to explain the patient's symptoms. Specifically, the posterior fossa structures are normal for age.   Assessment/Plan:  1. Migraine headache  2. Positional vertigo  3. Mild memory disturbance  The patient does not seem to be doing well on her current medication regimen. Her headaches may have increased on the metformin. The patient is having vertigo once again. We will send her to physical therapy for vestibular rehabilitation. The patient will be taken off of the Topamax, and change to Effexor for the headache. She is now having daily headaches. She will follow-up in 3-4 months. She is to contact our office if she requires a dose adjustment on the medication.  Marlan Palau MD 05/26/2015 8:11 PM  [facility] [address] [place], [place] [address]  Phone [phone] Fax [phone]  

## 2015-07-03 ENCOUNTER — Other Ambulatory Visit: Payer: Self-pay | Admitting: Neurology

## 2015-07-12 ENCOUNTER — Other Ambulatory Visit: Payer: Self-pay | Admitting: Orthopedic Surgery

## 2015-07-13 ENCOUNTER — Ambulatory Visit: Payer: Medicare Other | Attending: Neurology | Admitting: Physical Therapy

## 2015-07-13 ENCOUNTER — Encounter: Payer: Self-pay | Admitting: Physical Therapy

## 2015-07-13 DIAGNOSIS — H8111 Benign paroxysmal vertigo, right ear: Secondary | ICD-10-CM | POA: Diagnosis not present

## 2015-07-15 ENCOUNTER — Other Ambulatory Visit: Payer: Self-pay | Admitting: *Deleted

## 2015-07-15 ENCOUNTER — Telehealth: Payer: Self-pay | Admitting: Orthopedic Surgery

## 2015-07-15 MED ORDER — IBUPROFEN 800 MG PO TABS: 800.0000 mg | ORAL_TABLET | Freq: Three times a day (TID) | ORAL | Status: DC

## 2015-07-15 NOTE — Telephone Encounter (Signed)
Routing to Dr Harrison for approval 

## 2015-07-15 NOTE — Telephone Encounter (Signed)
Approved.  

## 2015-07-15 NOTE — Telephone Encounter (Signed)
Patient is calling requesting a refill on medication ibuprofen (ADVIL,MOTRIN) 800 MG tablet  Please advise?

## 2015-07-16 ENCOUNTER — Encounter: Payer: Self-pay | Admitting: Physical Therapy

## 2015-07-16 NOTE — Patient Instructions (Signed)
Sit to Side-Lying    Sit on edge of bed. 1. Turn head 45 to right. 2. Maintain head position and lie down slowly on left side. Hold until symptoms subside. 3. Sit up slowly. Hold until symptoms subside. 4. Turn head 45 to left. 5. Maintain head position and lie down slowly on right side. Hold until symptoms subside. 6. Sit up slowly. Repeat sequence _5___ times per session. Do _5Benign Positional Vertigo Vertigo is the feeling that you or your surroundings are moving when they are not. Benign positional vertigo is the most common form of vertigo. The cause of this condition is not serious (is benign). This condition is triggered by certain movements and positions (is positional). This condition can be dangerous if it occurs while you are doing something that could endanger you or others, such as driving.  CAUSES In many cases, the cause of this condition is not known. It may be caused by a disturbance in an area of the inner ear that helps your brain to sense movement and balance. This disturbance can be caused by a viral infection (labyrinthitis), head injury, or repetitive motion. RISK FACTORS This condition is more likely to develop in:  Women.  People who are 50 years of age or older. SYMPTOMS Symptoms of this condition usually happen when you move your head or your eyes in different directions. Symptoms may start suddenly, and they usually last for less than a minute. Symptoms may include:  Loss of balance and falling.  Feeling like you are spinning or moving.  Feeling like your surroundings are spinning or moving.  Nausea and vomiting.  Blurred vision.  Dizziness.  Involuntary eye movement (nystagmus). Symptoms can be mild and cause only slight annoyance, or they can be severe and interfere with daily life. Episodes of benign positional vertigo may return (recur) over time, and they may be triggered by certain movements. Symptoms may improve over time. DIAGNOSIS This  condition is usually diagnosed by medical history and a physical exam of the head, neck, and ears. You may be referred to a health care provider who specializes in ear, nose, and throat (ENT) problems (otolaryngologist) or a provider who specializes in disorders of the nervous system (neurologist). You may have additional testing, including:  MRI.  A CT scan.  Eye movement tests. Your health care provider may ask you to change positions quickly while he or she watches you for symptoms of benign positional vertigo, such as nystagmus. Eye movement may be tested with an electronystagmogram (ENG), caloric stimulation, the Dix-Hallpike test, or the roll test.  An electroencephalogram (EEG). This records electrical activity in your brain.  Hearing tests. TREATMENT Usually, your health care provider will treat this by moving your head in specific positions to adjust your inner ear back to normal. Surgery may be needed in severe cases, but this is rare. In some cases, benign positional vertigo may resolve on its own in 2-4 weeks. HOME CARE INSTRUCTIONS Safety  Move slowly.Avoid sudden body or head movements.  Avoid driving.  Avoid operating heavy machinery.  Avoid doing any tasks that would be dangerous to you or others if a vertigo episode would occur.  If you have trouble walking or keeping your balance, try using a cane for stability. If you feel dizzy or unstable, sit down right away.  Return to your normal activities as told by your health care provider. Ask your health care provider what activities are safe for you. General Instructions  Take over-the-counter and prescription medicines   only as told by your health care provider.  Avoid certain positions or movements as told by your health care provider.  Drink enough fluid to keep your urine clear or pale yellow.  Keep all follow-up visits as told by your health care provider. This is important. SEEK MEDICAL CARE IF:  You have a  fever.  Your condition gets worse or you develop new symptoms.  Your family or friends notice any behavioral changes.  Your nausea or vomiting gets worse.  You have numbness or a "pins and needles" sensation. SEEK IMMEDIATE MEDICAL CARE IF:  You have difficulty speaking or moving.  You are always dizzy.  You faint.  You develop severe headaches.  You have weakness in your legs or arms.  You have changes in your hearing or vision.  You develop a stiff neck.  You develop sensitivity to light.   This information is not intended to replace advice given to you by your health care provider. Make sure you discuss any questions you have with your health care provider.   Document Released: 07/03/2006 Document Revised: 06/16/2015 Document Reviewed: 01/18/2015 Elsevier Interactive Patient Education 2016 Elsevier Inc. ___ sessions per day.  Copyright  VHI. All rights reserved.   

## 2015-07-16 NOTE — Therapy (Signed)
[facility] [address] [place], [place], [address] Phone: [phone]   Fax:  [phone]  Physical Therapy Evaluation  Patient Details  Name: MIDORI DADO MRN: [id number] Date of Birth: 1945-12-27 Referring Provider:  York Spaniel, MD  Encounter Date: 07/13/2015      PT End of Session - 07/16/15 1059    Visit Number 1  G1   Date for PT Re-Evaluation 08/13/15   Authorization Type [employer]   Authorization Time Period 10-4 - 09-08-15   PT Start Time 1533   PT Stop Time 1624   PT Time Calculation (min) 51 min      Past Medical History  Diagnosis Date  . Back spasm   . Hand cramps   . Leg cramps   . Dizziness and giddiness 03/07/2013  . Headache(784.0) 03/07/2013  . Gastroesophageal reflux disease   . Degenerative arthritis   . Hypertension   . Dyslipidemia   . History of carpal tunnel syndrome     Bilateral  . Diabetes (HCC)     diet controlled  . Memory deficit 02/25/2014  . Seizures (HCC)     as child- unknown etiology-no meds  . Anemia     Past Surgical History  Procedure Laterality Date  . Back surgery    . Knee surgery Left     Pin, history of fracture  . Ankle surgery Left     History of ankle fracture  . Hand surgery      Carpal tunnel syndrome, trigger fingers bilaterally  . Shoulder open rotator cuff repair Left 04/08/2014    Dr. Romeo Apple  . Colonoscopy N/A 05/04/2014    Procedure: COLONOSCOPY;  Surgeon: West Bali, MD;  [facility];  Service: Endoscopy;  Laterality: N/A;  11:30  . Shoulder open rotator cuff repair Left 06/10/2014    Procedure: REVISION ROTATOR CUFF REPAIR SHOULDER OPEN WITH GRAFT;  Surgeon: Vickki Hearing, MD;  Location: [facility];  Service: Orthopedics;  Laterality: Left;  . Shoulder acromioplasty Left 06/10/2014    Procedure: SHOULDER ACROMIOPLASTY;  Surgeon: Vickki Hearing, MD;  Location: [facility];  Service: Orthopedics;  Laterality: Left;     There were no vitals filed for this visit.  Visit Diagnosis:  BPPV (benign paroxysmal positional vertigo), right - Plan: PT plan of care cert/re-cert      Subjective Assessment - 07/16/15 1049    Subjective Pt. reports vertigo started about 2 months ago with leaning back and looking up; states she feels it with turning over onto R side; states initially vertigo caused loss of appetite but states that it is now returning   Pertinent History L shoulder RTC repair in July 2015, then repair again 3 weeks later due to a fall and reinjury; has a CNA that comes daily in evening and provides S with bathing   Patient Stated Goals resolve the vertigo   Currently in Pain? No/denies            [facility] PT Assessment - 07/16/15 0001    Assessment   Medical Diagnosis Vertigo   Onset Date/Surgical Date --  July 2016   Precautions   Precautions Other (comment)  vetigo   Balance Screen   Has the patient fallen in the past 6 months Yes   How many times? 3   Has the patient had a decrease in activity level because of a fear of falling?  Yes   Is the patient reluctant to leave  their home because of a fear of falling?  Yes   Home Environment   Living Environment Private residence   Type of Home House   Home Access Stairs to enter   Entrance Stairs-Number of Steps 2   Entrance Stairs-Rails Right   Home Layout One level   Prior Function   Level of Independence Needs assistance with ADLs;Independent with gait   Leisure sings in choir   Observation/Other Assessments   Focus on Therapeutic Outcomes (FOTO)  64/100            Vestibular Assessment - 07/16/15 0001    Vestibular Assessment   General Observation Pt. is a 69 yr old lady with c/o intermittent vertigo - presents to initial eval alone and is independent with ambulaltion without a device   Symptom Behavior   Type of Dizziness Spinning   Frequency of Dizziness varies   Duration of Dizziness seconds   Aggravating Factors Looking  up to the ceiling;Rolling to right;Sitting with head tilted back;Turning head quickly   Relieving Factors Rest;Head stationary   Occulomotor Exam   Occulomotor Alignment Normal   Spontaneous Absent   Gaze-induced Absent   Smooth Pursuits Intact   Saccades Intact   Positional Testing   Dix-Hallpike Dix-Hallpike Right   Sidelying Test Sidelying Right   Dix-Hallpike Right   Dix-Hallpike Right Duration 3-4 seconds   Dix-Hallpike Right Symptoms Upbeat, right rotatory nystagmus   Dix-Hallpike Left   Dix-Hallpike Left Duration none   Dix-Hallpike Left Symptoms No nystagmus   Sidelying Right   Sidelying Right Duration approx. 3 seconds      Canalith Repositioning maneuver (Epley's) performed 3 reps for R BPPV with pt reporting no vertigo and no nystagmus  observed on 3rd rep; pt reported feeling much better with much less headache intensity after 3rd rep completed  Pt was instructed in Brandt-Daroff to do should R BPPV not be fully resovled; pt was also educated in etiology of BPPV and Given information on this diagnosis                 PT Education - 07/16/15 1058    Education provided Yes   Education Details habituation for R BPPV; info on BPPV   Person(s) Educated Patient   Methods Explanation;Demonstration;Handout   Comprehension Verbalized understanding;Returned demonstration          PT Short Term Goals - 10/12/14 1513    PT SHORT TERM GOAL #1   Title independent with initial HEP   Status Achieved   PT SHORT TERM GOAL #2   Title report pain decrease 25% or more to improve active L Shoulder overhead to 100 degrees when allowed in protocol.   Status Achieved   PT SHORT TERM GOAL #3   Title report increased use of L arm for home tasks/ feeding/ self care by 25% or more    Status Achieved   PT SHORT TERM GOAL #4   Title no sling with ADLs   Status Achieved           PT Long Term Goals - 07/16/15 1103    PT LONG TERM GOAL #1   Title Pt will have a  negative R Dix-Hallpike to indicate that R BPPV has resolved.  (08-13-15)   Time 4   Period Weeks   Status New   PT LONG TERM GOAL #2   Title Independent in HEP for habituation if needed if vertigo not fully resolved.  (08-13-15)   Time 4   Period  Weeks   Status New   PT LONG TERM GOAL #3   Title Report no vertigo with bed mobility or with ambulation.  (08-13-15)               Plan - 07/16/15 1100    Clinical Impression Statement Pt has signs and symptoms consistent with R BPPV; pt reported feeling much less vertigo and feeling better overall in regards to headache after 3rd rep of Epley's completed   Pt will benefit from skilled therapeutic intervention in order to improve on the following deficits Difficulty walking;Dizziness;Decreased balance   Rehab Potential Good   PT Frequency 1x / week   PT Duration 3 weeks   PT Treatment/Interventions ADLs/Self Care Home Management;Canalith Repostioning;Functional mobility training;Gait training;Balance training;Neuromuscular re-education;Patient/family education;Vestibular   PT Next Visit Plan recheck R Dix-Hallpike; administer Epley's prn   PT Home Exercise Plan habituation prn   Consulted and Agree with Plan of Care Patient         Problem List Patient Active Problem List   Diagnosis Date Noted  . Tremor 09/11/2014  . Unspecified constipation 04/30/2014  . Rotator cuff (capsule) sprain 04/13/2014  . Rotator cuff tear, left 02/26/2014  . Memory deficit 02/25/2014  . Dizziness and giddiness 03/07/2013  . Headache 03/07/2013    Kary Kos, PT 07/16/2015, 11:11 AM  [facility] [address] [place], [place], [address] Phone: [phone]   Fax:  [phone]

## 2015-07-29 ENCOUNTER — Other Ambulatory Visit (HOSPITAL_COMMUNITY): Payer: Self-pay | Admitting: Family Medicine

## 2015-07-29 DIAGNOSIS — Z1231 Encounter for screening mammogram for malignant neoplasm of breast: Secondary | ICD-10-CM

## 2015-08-05 ENCOUNTER — Ambulatory Visit: Payer: Medicare Other | Admitting: Physical Therapy

## 2015-08-05 DIAGNOSIS — H8111 Benign paroxysmal vertigo, right ear: Secondary | ICD-10-CM | POA: Diagnosis not present

## 2015-08-05 NOTE — Patient Instructions (Signed)
Tandem Stance    Right foot in front of left, heel touching toe both feet "straight ahead". Stand on Foot Triangle of Support with both feet. Balance in this position _30SINGLE LIMB STANCE    Stance: single leg on floor. Raise leg. Hold _10__ seconds. Repeat with other leg. _2__ reps per set, _1-2__ sets per day, _5__ days per week  Copyright  VHI. All rights reserved.  __ seconds. Do with left foot in front of right.  Copyright  VHI. All rights reserved.

## 2015-08-09 ENCOUNTER — Ambulatory Visit (HOSPITAL_COMMUNITY)
Admission: RE | Admit: 2015-08-09 | Discharge: 2015-08-09 | Disposition: A | Discharge status: Home / Self Care | Payer: Medicare Other | Source: Ambulatory Visit | Attending: Family Medicine | Admitting: Family Medicine

## 2015-08-09 ENCOUNTER — Encounter: Payer: Self-pay | Admitting: Physical Therapy

## 2015-08-09 DIAGNOSIS — Z1231 Encounter for screening mammogram for malignant neoplasm of breast: Secondary | ICD-10-CM | POA: Diagnosis present

## 2015-08-09 NOTE — Therapy (Signed)
[facility] [address] [place], [place], [address] Phone: [phone]   Fax:  [phone]  Physical Therapy Treatment  Patient Details  Name: Beth Meza MRN: [id number] Date of Birth: [birth year] No Data Recorded  Encounter Date: 08/05/2015      PT End of Session - 08/09/15 2013    Visit Number 2   Date for PT Re-Evaluation 08/13/15   Authorization Type [employer]   Authorization Time Period 10-4 - 09-08-15   PT Start Time 1450   PT Stop Time 1530   PT Time Calculation (min) 40 min      Past Medical History  Diagnosis Date  . Back spasm   . Hand cramps   . Leg cramps   . Dizziness and giddiness 03/07/2013  . Headache(784.0) 03/07/2013  . Gastroesophageal reflux disease   . Degenerative arthritis   . Hypertension   . Dyslipidemia   . History of carpal tunnel syndrome     Bilateral  . Diabetes ([facility])     diet controlled  . Memory deficit 02/25/2014  . Seizures ([facility])     as child- unknown etiology-no meds  . Anemia     Past Surgical History  Procedure Laterality Date  . Back surgery    . Knee surgery Left     Pin, history of fracture  . Ankle surgery Left     History of ankle fracture  . Hand surgery      Carpal tunnel syndrome, trigger fingers bilaterally  . Shoulder open rotator cuff repair Left 04/08/2014    Dr. Aline Brochure  . Colonoscopy N/A 05/04/2014    Procedure: COLONOSCOPY;  Surgeon: Danie Binder, MD;  Location: [facility];  Service: Endoscopy;  Laterality: N/A;  11:30  . Shoulder open rotator cuff repair Left 06/10/2014    Procedure: REVISION ROTATOR CUFF REPAIR SHOULDER OPEN WITH GRAFT;  Surgeon: Carole Civil, MD;  Location: [facility];  Service: Orthopedics;  Laterality: Left;  . Shoulder acromioplasty Left 06/10/2014    Procedure: SHOULDER ACROMIOPLASTY;  Surgeon: Carole Civil, MD;  Location: [facility];  Service: Orthopedics;  Laterality: Left;    There were no vitals filed  for this visit.  Visit Diagnosis:  BPPV (benign paroxysmal positional vertigo), right      Subjective Assessment - 08/09/15 2010    Subjective Pt reports vertigo is gone - "I haven't had the spinning"   Pertinent History L shoulder RTC repair in July 2015, then repair again 3 weeks later due to a fall and reinjury; has a CNA that comes daily in evening and provides S with bathing   Patient Stated Goals resolve the vertigo   Currently in Pain? No/denies         Discussed LTG's and progress - pt reports no vertigo at this time  Positional testing - sit to sidelying R and L - no nystagmus and no c/o vertigo  Instructed pt in tandem and single limb stance to improve balance - added these exercises to HEP                      PT Education - 08/09/15 2011    Education provided Yes   Education Details SLS and tandem stance   Person(s) Educated Patient   Methods Explanation;Demonstration;Handout   Comprehension Verbalized understanding;Returned demonstration             PT Long Term Goals - 08/09/15 2014    PT LONG  TERM GOAL #1   Title Pt will have a negative R Dix-Hallpike to indicate that R BPPV has resolved.  (08-13-15)   Baseline met 08-05-15   Status Achieved   PT LONG TERM GOAL #2   Title Independent in HEP for habituation if needed if vertigo not fully resolved.  (08-13-15)   Baseline met 08-05-15   Status Achieved   PT LONG TERM GOAL #3   Title Report no vertigo with bed mobility or with ambulation.  (08-13-15)   Baseline met 08-05-15   Status Achieved               Problem List Patient Active Problem List   Diagnosis Date Noted  . Tremor 09/11/2014  . Unspecified constipation 04/30/2014  . Rotator cuff (capsule) sprain 04/13/2014  . Rotator cuff tear, left 02/26/2014  . Memory deficit 02/25/2014  . Dizziness and giddiness 03/07/2013  . Headache 03/07/2013   PHYSICAL THERAPY DISCHARGE SUMMARY  Visits from Start of Care:  2  Current functional level related to goals / functional outcomes: See above for progress towards goals   Remaining deficits: No c/o vertigo at this time - appears to have resolved with treatment of Epley maneuver Minimally decr. High level balance skills    Education / Equipment: Brandt-Daroff exercises for habituation prn; balance exercises (tandem and SLS) Plan: Patient agrees to discharge.  Patient goals were met. Patient is being discharged due to meeting the stated rehab goals.  ?????       CBSWHQ, PRFFM BWGYKZL, PT 08/09/2015, 8:22 PM  [facility] [address] [place], [place], [address] Phone: [phone]   Fax:  [phone]  Name: Beth Meza MRN: [id number] Date of Birth: [birth year]

## 2015-08-26 ENCOUNTER — Other Ambulatory Visit: Payer: Self-pay | Admitting: Neurology

## 2015-09-23 ENCOUNTER — Ambulatory Visit (INDEPENDENT_AMBULATORY_CARE_PROVIDER_SITE_OTHER): Payer: Medicare Other | Admitting: Adult Health

## 2015-09-23 ENCOUNTER — Encounter (INDEPENDENT_AMBULATORY_CARE_PROVIDER_SITE_OTHER): Payer: Self-pay

## 2015-09-23 ENCOUNTER — Encounter: Payer: Self-pay | Admitting: Adult Health

## 2015-09-23 VITALS — BP 119/69 | HR 82 | Ht 67.0 in | Wt 159.5 lb

## 2015-09-23 DIAGNOSIS — R51 Headache: Secondary | ICD-10-CM | POA: Diagnosis not present

## 2015-09-23 DIAGNOSIS — H8110 Benign paroxysmal vertigo, unspecified ear: Secondary | ICD-10-CM | POA: Diagnosis not present

## 2015-09-23 DIAGNOSIS — R519 Headache, unspecified: Secondary | ICD-10-CM

## 2015-09-23 NOTE — Progress Notes (Signed)
I have read the note, and I agree with the clinical assessment and plan.  , KEITH   

## 2015-09-23 NOTE — Progress Notes (Signed)
PATIENT: Beth Meza DOB: 22-Mar-1946  REASON FOR VISIT: follow up- headaches, vertigo HISTORY FROM: patient  HISTORY OF PRESENT ILLNESS: Beth Meza is a 69 year old female with a history of headaches and vertigo. She returns today for follow-up. The patient states that she did go to physical therapy for vestibular rehabilitation and found it beneficial for her vertigo. She states that she will occasionally still get dizzy if she turns over to the right or looks up. She continues to do the exercises that they taught her at home. The patient also feels that her headaches have improved since starting Effexor. She states that she rarely has a headache but if she does it occurs in the frontal region. She does have photophobia and intermittent  phonophobia. Denies nausea or vomiting. Overall the patient feels that she is doing much better. She denies any new neurological symptoms. She returns today for an evaluation.  HISTORY 05/26/15 (WILLIS): Beth Meza is a 69 year old right-handed black female with history of frequent headaches. The patient recently was diagnosed with diabetes in March 2016, she is on metformin at this time. She indicates that after she went on metformin, her headaches seem to increase in frequency. She has had a very poor appetite with some weight loss starting the medication, and she has started supplementing her diet with Boost. The patient has had increased problems with dizziness over the last month, she will have vertigo when she lies down and rolls to one side or the other, and she indicates that she is not sleeping well at night. The patient will have some occasional nausea with the vertigo. The patient denies any recent falls. She has headaches mainly in the frontal areas. She continues to have some word finding problems on the Topamax, split dosing the medication has not helped. The patient could not get much benefit with Trokendi and she stopped the medication.  REVIEW OF  SYSTEMS: Out of a complete 14 system review of symptoms, the patient complains only of the following symptoms, and all other reviewed systems are negative.  Aching muscles, itching, dizziness, headache  ALLERGIES: No Known Allergies  HOME MEDICATIONS: Outpatient Prescriptions Prior to Visit  Medication Sig Dispense Refill  . amLODipine (NORVASC) 5 MG tablet Take 5 mg by mouth daily.    . ferrous sulfate 325 (65 FE) MG tablet Take 325 mg by mouth daily with breakfast.    . lisinopril-hydrochlorothiazide (PRINZIDE,ZESTORETIC) 20-25 MG per tablet Take 1 tablet by mouth daily.    . metFORMIN (GLUCOPHAGE) 500 MG tablet Take 500 mg by mouth 2 (two) times daily.  5  . Multiple Vitamin (MULTIVITAMIN) tablet Take 1 tablet by mouth daily.    . naproxen (NAPRELAN) 500 MG 24 hr tablet Take 500 mg by mouth 2 (two) times daily.    . Omega-3 Fatty Acids (FISH OIL) 1000 MG CAPS Take 1 capsule by mouth daily.    . pravastatin (PRAVACHOL) 40 MG tablet Take 40 mg by mouth daily.    . ranitidine (ZANTAC) 150 MG capsule Take 150 mg by mouth 2 (two) times daily.    Marland Kitchen venlafaxine XR (EFFEXOR-XR) 37.5 MG 24 hr capsule TAKE 1 TABLET DAILY FOR 2 WEEKS, THEN TAKE 2 TABLETS DAILY 60 capsule 2  . vitamin C (ASCORBIC ACID) 500 MG tablet Take 500 mg by mouth daily.    Marland Kitchen ibuprofen (ADVIL,MOTRIN) 800 MG tablet Take 1 tablet (800 mg total) by mouth 3 (three) times daily. 90 tablet 5   No facility-administered medications prior  to visit.    PAST MEDICAL HISTORY: Past Medical History  Diagnosis Date  . Back spasm   . Hand cramps   . Leg cramps   . Dizziness and giddiness 03/07/2013  . Headache(784.0) 03/07/2013  . Gastroesophageal reflux disease   . Degenerative arthritis   . Hypertension   . Dyslipidemia   . History of carpal tunnel syndrome     Bilateral  . Diabetes ([facility])     diet controlled  . Memory deficit 02/25/2014  . Seizures ([facility])     as child- unknown etiology-no meds  . Anemia     PAST SURGICAL  HISTORY: Past Surgical History  Procedure Laterality Date  . Back surgery    . Knee surgery Left     Pin, history of fracture  . Ankle surgery Left     History of ankle fracture  . Hand surgery      Carpal tunnel syndrome, trigger fingers bilaterally  . Shoulder open rotator cuff repair Left 04/08/2014    Dr. Romeo Apple  . Colonoscopy N/A 05/04/2014    Procedure: COLONOSCOPY;  Surgeon: West Bali, MD;  [facility];  Service: Endoscopy;  Laterality: N/A;  11:30  . Shoulder open rotator cuff repair Left 06/10/2014    Procedure: REVISION ROTATOR CUFF REPAIR SHOULDER OPEN WITH GRAFT;  Surgeon: Vickki Hearing, MD;  Location: [facility];  Service: Orthopedics;  Laterality: Left;  . Shoulder acromioplasty Left 06/10/2014    Procedure: SHOULDER ACROMIOPLASTY;  Surgeon: Vickki Hearing, MD;  Location: [facility];  Service: Orthopedics;  Laterality: Left;    FAMILY HISTORY: Family History  Problem Relation Age of Onset  . Heart attack Father   . Colon cancer Neg Hx     SOCIAL HISTORY: Social History   Social History  . Marital Status: Divorced    Spouse Name: N/A  . Number of Children: 3  . Years of Education: 14   Occupational History  . Retired     Agricultural consultant   Social History Main Topics  . Smoking status: Never Smoker   . Smokeless tobacco: Never Used  . Alcohol Use: No  . Drug Use: No  . Sexual Activity: Yes    Birth Control/ Protection: Post-menopausal   Other Topics Concern  . Not on file   Social History Narrative   Patient is right handed.   Patient does not drink caffeine.      PHYSICAL EXAM  Filed Vitals:   09/23/15 1252  BP: 119/69  Pulse: 82  Height:  (1.702 m)  Weight: 159 lb 8 oz (72.349 kg)   Body mass index is 24.98 kg/(m^2).  Generalized: Well developed, in no acute distress   Neurological examination  Mentation: Alert oriented to time, place, history taking. Follows all commands speech and language fluent Cranial nerve  II-XII: Pupils were equal round reactive to light. Extraocular movements were full, visual field were full on confrontational test. Facial sensation and strength were normal. Uvula tongue midline. Head turning and shoulder shrug  were normal and symmetric. Motor: The motor testing reveals 5 over 5 strength of all 4 extremities. Good symmetric motor tone is noted throughout.  Sensory: Sensory testing is intact to soft touch on all 4 extremities. No evidence of extinction is noted.  Coordination: Cerebellar testing reveals good finger-nose-finger and heel-to-shin bilaterally.  Gait and station: Gait is normal. Tandem gait is unsteady. Romberg is positive Reflexes: Deep tendon reflexes are symmetric and normal bilaterally.   DIAGNOSTIC DATA (LABS,  IMAGING, TESTING) - I reviewed patient records, labs, notes, testing and imaging myself where available.    ASSESSMENT AND PLAN 69 y.o. year old female  has a past medical history of Back spasm; Hand cramps; Leg cramps; Dizziness and giddiness (03/07/2013); [id number](914.7Headache(784.0) (03/07/2013); Gastroesophageal reflux disease; Degenerative arthritis; Hypertension; Dyslipidemia; History of carpal tunnel syndrome; Diabetes ([facility]); Memory deficit (02/25/2014); Seizures ([facility]); and Anemia. here with:  1. Headaches 2. Vertigo  Overall the patient is doing much better. She will continue on Effexor for her headaches. Patient advised that if her headache frequency or severity increases she should let us know. The patient's vertigo did improve with physical therapy. Patient advised that if her vertigo returns she should let us know and we will refer her back to vestibular rehabilitation. Patient is amenable to this plan. She will follow-up in 6 months or sooner if needed.  Butch PennyMegan , MSN, NP-C 09/23/2015, 12:59 PM [facility] [address], [address] [place], [place] [address] [phone]

## 2015-09-23 NOTE — Patient Instructions (Signed)
Continue Effexor  If vertigo returns let us know If your symptoms worsen or you develop new symptoms please let us know.

## 2015-11-26 ENCOUNTER — Other Ambulatory Visit: Payer: Self-pay | Admitting: Neurology

## 2015-12-27 ENCOUNTER — Emergency Department (HOSPITAL_COMMUNITY): Payer: Medicare Other

## 2015-12-27 ENCOUNTER — Encounter (HOSPITAL_COMMUNITY): Payer: Self-pay | Admitting: Emergency Medicine

## 2015-12-27 ENCOUNTER — Emergency Department (HOSPITAL_COMMUNITY)
Admission: EM | Admit: 2015-12-27 | Discharge: 2015-12-27 | Disposition: A | Discharge status: Home / Self Care | Payer: Medicare Other | Attending: Emergency Medicine | Admitting: Emergency Medicine

## 2015-12-27 DIAGNOSIS — I10 Essential (primary) hypertension: Secondary | ICD-10-CM | POA: Insufficient documentation

## 2015-12-27 DIAGNOSIS — S0990XA Unspecified injury of head, initial encounter: Secondary | ICD-10-CM | POA: Diagnosis present

## 2015-12-27 DIAGNOSIS — W01198A Fall on same level from slipping, tripping and stumbling with subsequent striking against other object, initial encounter: Secondary | ICD-10-CM | POA: Insufficient documentation

## 2015-12-27 DIAGNOSIS — Y929 Unspecified place or not applicable: Secondary | ICD-10-CM | POA: Insufficient documentation

## 2015-12-27 DIAGNOSIS — Z79899 Other long term (current) drug therapy: Secondary | ICD-10-CM | POA: Insufficient documentation

## 2015-12-27 DIAGNOSIS — M1990 Unspecified osteoarthritis, unspecified site: Secondary | ICD-10-CM | POA: Insufficient documentation

## 2015-12-27 DIAGNOSIS — S0083XA Contusion of other part of head, initial encounter: Secondary | ICD-10-CM | POA: Insufficient documentation

## 2015-12-27 DIAGNOSIS — Y9301 Activity, walking, marching and hiking: Secondary | ICD-10-CM | POA: Insufficient documentation

## 2015-12-27 DIAGNOSIS — S161XXA Strain of muscle, fascia and tendon at neck level, initial encounter: Secondary | ICD-10-CM | POA: Diagnosis not present

## 2015-12-27 DIAGNOSIS — E785 Hyperlipidemia, unspecified: Secondary | ICD-10-CM | POA: Diagnosis not present

## 2015-12-27 DIAGNOSIS — E119 Type 2 diabetes mellitus without complications: Secondary | ICD-10-CM | POA: Insufficient documentation

## 2015-12-27 DIAGNOSIS — Y999 Unspecified external cause status: Secondary | ICD-10-CM | POA: Insufficient documentation

## 2015-12-27 NOTE — Discharge Instructions (Signed)
Your CT scan of the head and neck are negative for acute problems. Please use Tylenol every 4 hours, or ibuprofen every 6 hours for soreness. Please use an ice pack to the bruises to the face of fore head. Please return immediately if any changes, problems, or concerns. Facial or Scalp Contusion  A facial or scalp contusion is a deep bruise on the face or head. Contusions happen when an injury causes bleeding under the skin. Signs of bruising include pain, puffiness (swelling), and discolored skin. The contusion may turn blue, purple, or yellow. HOME CARE  Only take medicines as told by your doctor.  Put ice on the injured area.  Put ice in a plastic bag.  Place a towel between your skin and the bag.  Leave the ice on for 20 minutes, 2-3 times a day. GET HELP IF:  You have bite problems.  You have pain when chewing.  You are worried about your face not healing normally. GET HELP RIGHT AWAY IF:   You have severe pain or a headache and medicine does not help.  You are very tired or confused, or your personality changes.  You throw up (vomit).  You have a nosebleed that will not stop.  You see two of everything (double vision) or have blurry vision.  You have fluid coming from your nose or ear.  You have problems walking or using your arms or legs. MAKE SURE YOU:   Understand these instructions.  Will watch your condition.  Will get help right away if you are not doing well or get worse.   This information is not intended to replace advice given to you by your health care provider. Make sure you discuss any questions you have with your health care provider.   Document Released: 09/14/2011 Document Revised: 10/16/2014 Document Reviewed: 05/08/2013 Elsevier Interactive Patient Education [employer]2016 [employer]

## 2015-12-27 NOTE — ED Provider Notes (Signed)
CSN: [id number]     Arrival date & time 12/27/15  1703 History  By signing my name below, I, Beth Meza, attest that this documentation has been prepared under the direction and in the presence of Beth Meza , PA-C. Electronically Signed: Lyndel SafeKaitlyn Meza, ED Scribe. 12/27/2015. 7:03 PM.   Chief Complaint  Patient presents with  . Fall   Patient is a 70 y.o. female presenting with fall. The history is provided by the patient. No language interpreter was used.  Fall This is a new problem. The current episode started 3 to 5 hours ago. The problem occurs constantly. The problem has not changed since onset.Associated symptoms include headaches. Pertinent negatives include no chest pain, no abdominal pain and no shortness of breath. Nothing aggravates the symptoms. Nothing relieves the symptoms. She has tried nothing for the symptoms. The treatment provided no relief.   HPI Comments: Beth Meza is a 70 y.o. female, with a past medical history as noted below, who presents to the Emergency Department for evaluation of a mechanical fall that occurred 3 hours ago. Pt reports she was walking in her yard when she slipped and fell hitting her head on a tree trunk. There is an associated hematoma lateral to left periorbital region and she associates a headache that radiates down her posterior neck. She also notes a room-spinning dizziness which she attributes to dx vertigo, but she cannot admit to double vision. She denies LOC. She is not taking blood thinning medications.   Past Medical History  Diagnosis Date  . Back spasm   . Hand cramps   . Leg cramps   . Dizziness and giddiness 03/07/2013  . Headache(784.0) 03/07/2013  . Gastroesophageal reflux disease   . Degenerative arthritis   . Hypertension   . Dyslipidemia   . History of carpal tunnel syndrome     Bilateral  . Diabetes (HCC)     diet controlled  . Memory deficit 02/25/2014  . Seizures (HCC)     as child- unknown etiology-no meds   . Anemia    Past Surgical History  Procedure Laterality Date  . Back surgery    . Knee surgery Left     Pin, history of fracture  . Ankle surgery Left     History of ankle fracture  . Hand surgery      Carpal tunnel syndrome, trigger fingers bilaterally  . Shoulder open rotator cuff repair Left 04/08/2014    Dr. Romeo AppleHarrison  . Colonoscopy N/A 05/04/2014    Procedure: COLONOSCOPY;  Surgeon: West BaliSandi L Fields, MD;  Location: [facility];  Service: Endoscopy;  Laterality: N/A;  11:30  . Shoulder open rotator cuff repair Left 06/10/2014    Procedure: REVISION ROTATOR CUFF REPAIR SHOULDER OPEN WITH GRAFT;  Surgeon: Vickki HearingStanley E Harrison, MD;  Location: [facility];  Service: Orthopedics;  Laterality: Left;  . Shoulder acromioplasty Left 06/10/2014    Procedure: SHOULDER ACROMIOPLASTY;  Surgeon: Vickki HearingStanley E Harrison, MD;  Location: [facility];  Service: Orthopedics;  Laterality: Left;   Family History  Problem Relation Age of Onset  . Heart attack Father   . Colon cancer Neg Hx    Social History  Substance Use Topics  . Smoking status: Never Smoker   . Smokeless tobacco: Never Used  . Alcohol Use: No   OB History    No data available     Review of Systems  Eyes: Negative for visual disturbance.  Respiratory: Negative for shortness of breath.   Cardiovascular: Negative for  chest pain.  Gastrointestinal: Negative for abdominal pain.  Skin: Positive for color change ( ecchymosis).  Neurological: Positive for dizziness and headaches. Negative for syncope.  Hematological: Does not bruise/bleed easily.  All other systems reviewed and are negative.  Allergies  Review of patient's allergies indicates no known allergies.  Home Medications   Prior to Admission medications   Medication Sig Start Date End Date Taking? Authorizing Provider  amLODipine (NORVASC) 5 MG tablet Take 5 mg by mouth daily.   Yes Historical Provider, MD  ferrous sulfate 325 (65 FE) MG tablet Take 325 mg by mouth daily with  breakfast.   Yes Historical Provider, MD  lisinopril-hydrochlorothiazide (PRINZIDE,ZESTORETIC) 20-25 MG per tablet Take 1 tablet by mouth daily.   Yes Historical Provider, MD  metFORMIN (GLUCOPHAGE) 500 MG tablet Take 500 mg by mouth 2 (two) times daily. 05/04/15  Yes Historical Provider, MD  Multiple Vitamin (MULTIVITAMIN) tablet Take 1 tablet by mouth daily.   Yes Historical Provider, MD  naproxen (NAPROSYN) 500 MG tablet Take 500 mg by mouth 2 (two) times daily. 12/23/15  Yes Historical Provider, MD  Omega-3 Fatty Acids (FISH OIL) 1000 MG CAPS Take 1 capsule by mouth daily.   Yes Historical Provider, MD  pravastatin (PRAVACHOL) 40 MG tablet Take 40 mg by mouth daily. 02/11/14  Yes Historical Provider, MD  ranitidine (ZANTAC) 150 MG capsule Take 150 mg by mouth 2 (two) times daily.   Yes Historical Provider, MD  venlafaxine XR (EFFEXOR-XR) 37.5 MG 24 hr capsule TAKE 1 CAPSULE DAILY FOR 2 WEEKS,THEN 2 DAILY 11/26/15  Yes York Spaniel, MD  vitamin C (ASCORBIC ACID) 500 MG tablet Take 500 mg by mouth daily.   Yes Historical Provider, MD   BP 125/69 mmHg  Pulse 84  Temp(Src) 97.9 F (36.6 C) (Oral)  Resp 18  Ht  (1.676 m)  Wt 160 lb (72.576 kg)  BMI 25.84 kg/m2  SpO2 100% Physical Exam  Constitutional: She is oriented to person, place, and time. She appears well-developed and well-nourished. No distress.  HENT:  Head: Normocephalic.  Negative battles sign. No blood in nares. No trauma to tongue or teeth. Airway patent.    Eyes: Conjunctivae and EOM are normal. Pupils are equal, round, and reactive to light.  Hematoma of left lateral orbit. Anterior chamber is clear, PERRL, EOM intact.  Neck: Normal range of motion. Neck supple.  Cardiovascular: Normal rate.   Pulmonary/Chest: Effort normal. No respiratory distress. She exhibits no tenderness.  No chest wall tenderness. Symmetrical rise and fall of chest.   Abdominal: Soft. Bowel sounds are normal.  Musculoskeletal: Normal range of  motion. She exhibits tenderness.  No palpable step-off of cervical spine. Full ROM of all extremities. Right lateral neck pain and soreness of right paraspinal neck.   Neurological: She is alert and oriented to person, place, and time. Coordination normal.  Skin: Skin is warm.  Psychiatric: She has a normal mood and affect. Her behavior is normal.  Nursing note and vitals reviewed.   ED Course  Procedures  DIAGNOSTIC STUDIES: Oxygen Saturation is 100% on RA, normal by my interpretation.    COORDINATION OF CARE: 7:00 PM Discussed treatment plan with pt at bedside which includes head CT and CT cervical spine and pt agreed to plan.   Labs Review Labs Reviewed - No data to display  Imaging Review Ct Head Wo Contrast  12/27/2015  CLINICAL DATA:  Slip and fall in yard hitting head. Left face hematoma. Left neck pain.  Initial encounter. Headache. EXAM: CT HEAD WITHOUT CONTRAST CT CERVICAL SPINE WITHOUT CONTRAST TECHNIQUE: Multidetector CT imaging of the head and cervical spine was performed following the standard protocol without intravenous contrast. Multiplanar CT image reconstructions of the cervical spine were also generated. COMPARISON:  Head CT 05/04/2014 FINDINGS: CT HEAD FINDINGS There is no evidence of acute cortical infarct, intracranial hemorrhage, mass, midline shift, or extra-axial fluid collection. Ventricles and sulci are within normal limits for age. Orbits are unremarkable. The visualized paranasal sinuses and mastoid air cells are clear. No skull fracture is identified. CT CERVICAL SPINE FINDINGS There is no evidence of traumatic subluxation. Vertebral body heights are preserved. Incidental note is made of incomplete fusion of the C1 posterior ring, a normal developmental variant. No acute cervical spine fracture is identified. Mild-to-moderate disc space narrowing is present at C5-6 with associated vacuum disc phenomenon and degenerative endplate spurring. Mild cervical facet  arthrosis is noted, most prominently at C7-T1 on the right. Paraspinal soft tissues are unremarkable. IMPRESSION: 1. No evidence of acute intracranial abnormality. 2. No acute abnormality identified in the cervical spine. Electronically Signed   By: Sebastian Ache M.D.   On: 12/27/2015 19:51   Ct Cervical Spine Wo Contrast  12/27/2015  CLINICAL DATA:  Slip and fall in yard hitting head. Left face hematoma. Left neck pain. Initial encounter. Headache. EXAM: CT HEAD WITHOUT CONTRAST CT CERVICAL SPINE WITHOUT CONTRAST TECHNIQUE: Multidetector CT imaging of the head and cervical spine was performed following the standard protocol without intravenous contrast. Multiplanar CT image reconstructions of the cervical spine were also generated. COMPARISON:  Head CT 05/04/2014 FINDINGS: CT HEAD FINDINGS There is no evidence of acute cortical infarct, intracranial hemorrhage, mass, midline shift, or extra-axial fluid collection. Ventricles and sulci are within normal limits for age. Orbits are unremarkable. The visualized paranasal sinuses and mastoid air cells are clear. No skull fracture is identified. CT CERVICAL SPINE FINDINGS There is no evidence of traumatic subluxation. Vertebral body heights are preserved. Incidental note is made of incomplete fusion of the C1 posterior ring, a normal developmental variant. No acute cervical spine fracture is identified. Mild-to-moderate disc space narrowing is present at C5-6 with associated vacuum disc phenomenon and degenerative endplate spurring. Mild cervical facet arthrosis is noted, most prominently at C7-T1 on the right. Paraspinal soft tissues are unremarkable. IMPRESSION: 1. No evidence of acute intracranial abnormality. 2. No acute abnormality identified in the cervical spine. Electronically Signed   By: Sebastian Ache M.D.   On: 12/27/2015 19:51   I have personally reviewed and evaluated these images and lab results as part of my medical decision-making.   MDM  Vital  signs within normal limits. CT scan of the cervical spine reveals no acute abnormality identified. CT of the hand reveals no acute intracranial abnormality. There are noted degenerative changes involving the cervical spine.  The patient is ambulatory. No gross neurologic deficits appreciated at this time. The patient states she will try to use Tylenol for discomfort. The patient is given instruction to return if any deterioration in her condition, problems or concerns.    Final diagnoses:  Contusion of forehead, initial encounter  Cervical strain, initial encounter    *I have reviewed nursing notes, vital signs, and all appropriate lab and imaging results for this patient.**  **I personally performed the services described in this documentation, which was scribed in my presence. The recorded information has been reviewed and is accurate.Beth Quale, PA-C 01/02/16 2025  Glynn Octave, MD 01/03/16  1746 

## 2015-12-27 NOTE — ED Notes (Signed)
Pt reports slipping and falling in her yard and hitting her head. Denies LOC or dizziness. Pt denies blood thinners. Pt noted to have abrasion on LT cheek. Pt also c/o HA.

## 2016-03-23 ENCOUNTER — Ambulatory Visit (INDEPENDENT_AMBULATORY_CARE_PROVIDER_SITE_OTHER): Payer: Medicare Other | Admitting: Adult Health

## 2016-03-23 ENCOUNTER — Encounter: Payer: Self-pay | Admitting: Adult Health

## 2016-03-23 VITALS — BP 116/60 | HR 68 | Resp 16 | Ht 66.0 in | Wt 165.2 lb

## 2016-03-23 DIAGNOSIS — G43009 Migraine without aura, not intractable, without status migrainosus: Secondary | ICD-10-CM | POA: Diagnosis not present

## 2016-03-23 DIAGNOSIS — H8110 Benign paroxysmal vertigo, unspecified ear: Secondary | ICD-10-CM

## 2016-03-23 DIAGNOSIS — W19XXXS Unspecified fall, sequela: Secondary | ICD-10-CM

## 2016-03-23 NOTE — Progress Notes (Signed)
PATIENT: Beth LabsBeulah M Meza DOB: 08/20/1946  REASON FOR VISIT: follow up- vertigo, headache HISTORY FROM: patient  HISTORY OF PRESENT ILLNESS: Beth Meza is a 70 year old female with a history of headaches and vertigo. She returns today for follow-up. She states that Effexor has been fitted Radiographer, therapeuticisher for her headaches. She states that she did suffer a fall in March. She states that she fell and hit the left side of her face on a tree. She states that since then she feels like her headaches and dizziness has increased slightly. However she still feels that this is manageable at this time. In the past she has been on Topamax but that calls some issues with cognition. She states that she can control her dizziness by "stopping" what she is doing and "trying to calm down." She returns today for an evaluation.  HISTORY 09/23/15: Beth Meza is a 70 year old female with a history of headaches and vertigo. She returns today for follow-up. The patient states that she did go to physical therapy for vestibular rehabilitation and found it beneficial for her vertigo. She states that she will occasionally still get dizzy if she turns over to the right or looks up. She continues to do the exercises that they taught her at home. The patient also feels that her headaches have improved since starting Effexor. She states that she rarely has a headache but if she does it occurs in the frontal region. She does have photophobia and intermittent phonophobia. Denies nausea or vomiting. Overall the patient feels that she is doing much better. She denies any new neurological symptoms. She returns today for an evaluation.  HISTORY 05/26/15 (Beth Meza): Beth Meza is a 70 year old right-handed black female with history of frequent headaches. The patient recently was diagnosed with diabetes in March 2016, she is on metformin at this time. She indicates that after she went on metformin, her headaches seem to increase in frequency. She has  had a very poor appetite with some weight loss starting the medication, and she has started supplementing her diet with Boost. The patient has had increased problems with dizziness over the last month, she will have vertigo when she lies down and rolls to one side or the other, and she indicates that she is not sleeping well at night. The patient will have some occasional nausea with the vertigo. The patient denies any recent falls. She has headaches mainly in the frontal areas. She continues to have some word finding problems on the Topamax, split dosing the medication has not helped. The patient could not get much benefit with Trokendi and she stopped the medication  REVIEW OF SYSTEMS: Out of a complete 14 system review of symptoms, the patient complains only of the following symptoms, and all other reviewed systems are negative.  See history of present illness  ALLERGIES: No Known Allergies  HOME MEDICATIONS: Outpatient Prescriptions Prior to Visit  Medication Sig Dispense Refill  . amLODipine (NORVASC) 5 MG tablet Take 5 mg by mouth daily.    . ferrous sulfate 325 (65 FE) MG tablet Take 325 mg by mouth daily with breakfast.    . lisinopril-hydrochlorothiazide (PRINZIDE,ZESTORETIC) 20-25 MG per tablet Take 1 tablet by mouth daily.    . metFORMIN (GLUCOPHAGE) 500 MG tablet Take 500 mg by mouth 2 (two) times daily.  5  . Multiple Vitamin (MULTIVITAMIN) tablet Take 1 tablet by mouth daily.    . naproxen (NAPROSYN) 500 MG tablet Take 500 mg by mouth 2 (two) times daily.  5  .  Omega-3 Fatty Acids (FISH OIL) 1000 MG CAPS Take 1 capsule by mouth daily.    . pravastatin (PRAVACHOL) 40 MG tablet Take 40 mg by mouth daily.    . ranitidine (ZANTAC) 150 MG capsule Take 150 mg by mouth 2 (two) times daily.    Marland Kitchen venlafaxine XR (EFFEXOR-XR) 37.5 MG 24 hr capsule TAKE 1 CAPSULE DAILY FOR 2 WEEKS,THEN 2 DAILY 60 capsule 5  . vitamin C (ASCORBIC ACID) 500 MG tablet Take 500 mg by mouth daily.     No  facility-administered medications prior to visit.    PAST MEDICAL HISTORY: Past Medical History  Diagnosis Date  . Back spasm   . Hand cramps   . Leg cramps   . Dizziness and giddiness 03/07/2013  . Headache(784.0) 03/07/2013  . Gastroesophageal reflux disease   . Degenerative arthritis   . Hypertension   . Dyslipidemia   . History of carpal tunnel syndrome     Bilateral  . Diabetes (HCC)     diet controlled  . Memory deficit 02/25/2014  . Seizures (HCC)     as child- unknown etiology-no meds  . Anemia     PAST SURGICAL HISTORY: Past Surgical History  Procedure Laterality Date  . Back surgery    . Knee surgery Left     Pin, history of fracture  . Ankle surgery Left     History of ankle fracture  . Hand surgery      Carpal tunnel syndrome, trigger fingers bilaterally  . Shoulder open rotator cuff repair Left 04/08/2014    Dr. Romeo Apple  . Colonoscopy N/A 05/04/2014    Procedure: COLONOSCOPY;  Surgeon: West Bali, MD;  Location: [facility];  Service: Endoscopy;  Laterality: N/A;  11:30  . Shoulder open rotator cuff repair Left 06/10/2014    Procedure: REVISION ROTATOR CUFF REPAIR SHOULDER OPEN WITH GRAFT;  Surgeon: Vickki Hearing, MD;  Location: [facility];  Service: Orthopedics;  Laterality: Left;  . Shoulder acromioplasty Left 06/10/2014    Procedure: SHOULDER ACROMIOPLASTY;  Surgeon: Vickki Hearing, MD;  Location: [facility];  Service: Orthopedics;  Laterality: Left;    FAMILY HISTORY: Family History  Problem Relation Age of Onset  . Heart attack Father   . Colon cancer Neg Hx     SOCIAL HISTORY: Social History   Social History  . Marital Status: Divorced    Spouse Name: N/A  . Number of Children: 3  . Years of Education: 14   Occupational History  . Retired     Agricultural consultant   Social History Main Topics  . Smoking status: Never Smoker   . Smokeless tobacco: Never Used  . Alcohol Use: No  . Drug Use: No  . Sexual Activity: Yes    Birth Control/  Protection: Post-menopausal   Other Topics Concern  . Not on file   Social History Narrative   Patient is right handed.   Patient does not drink caffeine.      PHYSICAL EXAM  Filed Vitals:   03/23/16 1315  BP: 116/60  Pulse: 68  Resp: 16  Height: 5\' 6"  (1.676 m)  Weight: 165 lb 3.2 oz (74.934 kg)   Body mass index is 26.68 kg/(m^2).  Generalized: Well developed, in no acute distress   Neurological examination  Mentation: Alert oriented to time, place, history taking. Follows all commands speech and language fluent Cranial nerve II-XII: Pupils were equal round reactive to light. Extraocular movements were full, visual field were full on confrontational  test. Facial sensation and strength were normal. Uvula tongue midline. Head turning and shoulder shrug  were normal and symmetric. Motor: The motor testing reveals 5 over 5 strength of all 4 extremities. Good symmetric motor tone is noted throughout.  Sensory: Sensory testing is intact to soft touch on all 4 extremities. No evidence of extinction is noted.  Coordination: Cerebellar testing reveals good finger-nose-finger and heel-to-shin bilaterally.  Gait and station: Gait is normal. Tandem gait is normal. Romberg is negative. No drift is seen.  Reflexes: Deep tendon reflexes are symmetric and normal bilaterally.   DIAGNOSTIC DATA (Meza, IMAGING, TESTING) - I reviewed patient records, Meza, notes, testing and imaging myself where available.      Component Value Date/Time   NA 141 06/05/2014 1015   K 4.4 06/05/2014 1015   CL 104 06/05/2014 1015   CO2 24 06/05/2014 1015   GLUCOSE 94 06/05/2014 1015   BUN 19 06/05/2014 1015   CREATININE 1.40* 06/05/2014 1015   CREATININE 1.88* 05/12/2014 1040   CALCIUM 9.4 06/05/2014 1015   PROT 9.0* 11/04/2007 0009   ALBUMIN 4.2 11/04/2007 0009   AST 115* 11/04/2007 0009   ALT 74* 11/04/2007 0009   ALKPHOS 62 11/04/2007 0009   BILITOT 0.7 11/04/2007 0009   GFRNONAA 38* 06/05/2014  1015   GFRAA 44* 06/05/2014 1015      ASSESSMENT AND PLAN 70 y.o. year old female  has a past medical history of Back spasm; Hand cramps; Leg cramps; Dizziness and giddiness (03/07/2013); ZOXWRUEA(540.9) (03/07/2013); Gastroesophageal reflux disease; Degenerative arthritis; Hypertension; Dyslipidemia; History of carpal tunnel syndrome; Diabetes (HCC); Memory deficit (02/25/2014); Seizures (HCC); and Anemia. here with:  1. Migraines 2. Vertigo 3. Fall-trauma to head  Overall the patient is doing well. Her headaches and dizziness has slightly increased since she had a fall. She will continue on Effexor. She's been advised that if her symptoms worsen or she develops any new symptoms she should let us know. She will follow-up in 6 months or sooner if needed.  Butch Penny, MSN, NP-C 03/23/2016, 1:15 PM [facility] [address], [address] [place], [place] [address] [phone]

## 2016-03-23 NOTE — Patient Instructions (Signed)
Continue Effexor  If your symptoms worsen or you develop new symptoms please let us know.

## 2016-03-25 NOTE — Progress Notes (Signed)
I reviewed note and agree with plan.    R. , MD 03/25/2016, 10:59 PM Certified in Neurology, Neurophysiology and Neuroimaging  [facility] [address], [address] [place], [place] [address] [phone]  

## 2016-05-02 ENCOUNTER — Telehealth: Payer: Self-pay | Admitting: Neurology

## 2016-05-02 NOTE — Telephone Encounter (Signed)
Pt declined work-in appt w/ Dr. Anne Hahn this week but asked for 1st available appt w/ [name] NP. Scheduled 8/16 @ 8:30. Pt says she will call back if she needs to reschedule as she has a dentist appt scheduled during the same week.

## 2016-05-02 NOTE — Telephone Encounter (Signed)
Attempted to reach pt w/o success. Left VM mssg. [name] is out-of-office this week. She may call back if she'd like work-in appt w/ Dr. Anne Hahn tomorrow or Thursday @ 7:30 a.m.

## 2016-05-02 NOTE — Telephone Encounter (Signed)
Patient called to request appointment ASAP for headache, states when she saw [name] in June, Megan advised her to call back if she needed to be seen for head swimming and hurting.

## 2016-05-23 ENCOUNTER — Other Ambulatory Visit: Payer: Self-pay | Admitting: Neurology

## 2016-05-24 ENCOUNTER — Encounter: Payer: Self-pay | Admitting: Adult Health

## 2016-05-24 ENCOUNTER — Other Ambulatory Visit: Payer: Self-pay | Admitting: Neurology

## 2016-05-24 ENCOUNTER — Ambulatory Visit (INDEPENDENT_AMBULATORY_CARE_PROVIDER_SITE_OTHER): Payer: Medicare Other | Admitting: Adult Health

## 2016-05-24 ENCOUNTER — Other Ambulatory Visit: Payer: Self-pay | Admitting: Adult Health

## 2016-05-24 VITALS — BP 115/71 | HR 82 | Ht 66.0 in | Wt 164.6 lb

## 2016-05-24 DIAGNOSIS — R42 Dizziness and giddiness: Secondary | ICD-10-CM | POA: Diagnosis not present

## 2016-05-24 DIAGNOSIS — R413 Other amnesia: Secondary | ICD-10-CM

## 2016-05-24 DIAGNOSIS — R519 Headache, unspecified: Secondary | ICD-10-CM

## 2016-05-24 DIAGNOSIS — R51 Headache: Secondary | ICD-10-CM | POA: Diagnosis not present

## 2016-05-24 MED ORDER — ZONISAMIDE 25 MG PO CAPS: 25.0000 mg | ORAL_CAPSULE | Freq: Every day | ORAL | 5 refills | Status: DC

## 2016-05-24 MED ORDER — VENLAFAXINE HCL ER 37.5 MG PO CP24: ORAL_CAPSULE | ORAL | 5 refills | Status: DC

## 2016-05-24 NOTE — Progress Notes (Signed)
I have read the note, and I agree with the clinical assessment and plan.  , KEITH   

## 2016-05-24 NOTE — Patient Instructions (Signed)
Continue effexor  Start Zonegran 25 mg at bedtime If your symptoms worsen or you develop new symptoms please let us know.   Zonisamide capsules What is this medicine? ZONISAMIDE (zoe NIS a mide) is used to control partial seizures in adults with epilepsy. This medicine may be used for other purposes; ask your health care provider or pharmacist if you have questions. What should I tell my health care provider before I take this medicine? They need to know if you have any of these conditions: -dehydrated -diarrhea -history of metabolic acidosis (too much acid in your blood) -ketogenic diet -kidney disease -liver disease -lung disease -osteoporosis -suicidal thoughts, plans, or attempt; a previous suicide attempt by you or a family member -an unusual or allergic reaction to zonisamide, sulfa drugs, other medicines, foods, dyes, or preservatives -pregnant or trying to get pregnant -breast-feeding How should I use this medicine? Take this medicine by mouth with a glass of water. Follow the directions on the prescription label. Swallow whole. Do not break open the capsule. This medicine may be taken with or without food. Take your doses at regular intervals. Do not take your medicine more often than directed. Do not stop taking this medicine unless instructed by your doctor or health care professional. A special MedGuide will be given to you by the pharmacist with each prescription and refill. Be sure to read this information carefully each time. Talk to your pediatrician regarding the use of this medicine in children. While this drug may be prescribed for children as young as 68 years of age for selected conditions, precautions do apply. Overdosage: If you think you have taken too much of this medicine contact a poison control center or emergency room at once. NOTE: This medicine is only for you. Do not share this medicine with others. What if I miss a dose? If you miss a dose, take it as  soon as you can. If it is almost time for your next dose, take only that dose. Do not take double or extra doses. What may interact with this medicine? -barbiturates like phenobarbital -carbamazepine -phenytoin This list may not describe all possible interactions. Give your health care provider a list of all the medicines, herbs, non-prescription drugs, or dietary supplements you use. Also tell them if you smoke, drink alcohol, or use illegal drugs. Some items may interact with your medicine. What should I watch for while using this medicine? Visit your doctor or health care professional for regular checks on your progress. Wear a medical identification bracelet or chain to say you have epilepsy, and carry a card that lists all your medications. It is important to take this medicine exactly as directed. When first starting treatment, your dose will need to be adjusted slowly. It may take weeks or months before your dose is stable. You should contact your doctor or health care professional if your seizures get worse or if you have any new types of seizures. Do not stop taking except on your doctor's advice. You may develop a severe reaction. Your doctor will tell you how much medicine to take. You may get drowsy, dizzy, or have blurred vision. Do not drive, use machinery, or do anything that needs mental alertness until you know how this medicine affects you. To reduce dizzy or fainting spells, do not sit or stand up quickly, especially if you are an older patient. Alcohol can increase drowsiness and dizziness. Avoid alcoholic drinks. Avoid extreme heat. This medicine can cause you to sweat less than  normal. Your body temperature could increase to dangerous levels, which may lead to heat stroke. This medicine may increase the chance of developing metabolic acidosis. If left untreated, this can cause kidney stones, bone disease, or slowed growth in children. Symptoms include breathing fast, fatigue, loss of  appetite, irregular heartbeat, or loss of consciousness. Call your doctor immediately if you experience any of these side effects. Also, tell your doctor about any surgery you plan on having while taking this medicine since this may increase your risk for metabolic acidosis. This medicines may increase the risk of kidney stones. Drinking 6 to 8 glasses of water a day may help prevent the formation of kidney stones. The use of this medicine may increase the chance of suicidal thoughts or actions. Pay special attention to how you are responding while on this medicine. Any worsening of mood, or thoughts of suicide or dying should be reported to your health care professional right away. Women who become pregnant while using this medicine may enroll in the [place] Antiepileptic Drug Pregnancy Registry by calling [phone]. This registry collects information about the safety of antiepileptic drug use during pregnancy. What side effects may I notice from receiving this medicine? Side effects that you should report to your doctor or health care professional immediately: -allergic reactions like skin rash, itching or hives, swelling of the face, lips, or tongue -decreased sweating or a rise in body temperature, especially in patients under 68 years old -difficulty breathing or tightening of the throat -feeling faint or lightheaded, falls -fever, sore throat, sores in your mouth, or bruising easily -hallucination, loss of contact with reality -irregular heartbeat -loss of appetite -redness, blistering, peeling or loosening of the skin, including inside the mouth -severe drowsiness, difficulty concentrating, or coordination problems -speech or language problems -sudden back pain, abdominal pain, pain when urinating, bloody or dark urine -suicidal thoughts or depression -unusual changes in behavior or mood -unusually weak or tired -vomiting Side effects that usually do not require medical  attention (report to your doctor or health care professional if they continue or are bothersome): -headache -nausea This list may not describe all possible side effects. Call your doctor for medical advice about side effects. You may report side effects to FDA at 1-800-FDA-1088. Where should I keep my medicine? Keep out of reach of children. Store at room temperature between 15 and 30 degrees C (59 and 86 degrees F). Keep in a dry place protected from light. Throw away any unused medicine after the expiration date. NOTE: This sheet is a summary. It may not cover all possible information. If you have questions about this medicine, talk to your doctor, pharmacist, or health care provider.    2016, [employer]vier/[employer] Standard. (2010-07-07 15:16:42)

## 2016-05-24 NOTE — Progress Notes (Signed)
PATIENT: Beth Meza DOB: Oct 03, 1946  REASON FOR VISIT: follow up- headaches/vertigo HISTORY FROM: patient  HISTORY OF PRESENT ILLNESS: Ms. Beth Meza is a 70 year old female with a history of headaches and vertigo. She returns today for follow-up. She is on Effexor and has been tolerating that well. She reports that since her fall in March her headaches initially got worse but never improved. She states she typically has a headache in the morning. Her headaches are located on the left side of the face. Around the eye and in the temporal region. She reports that  the right parietal region is tender to touch. She denies photophobia and phonophobia. She does report some nausea but no vomiting. She states her vertigo usually occurs with all headaches. It usually does not occur independently. She states that during episodes of vertigo she does feel off balance. Occasionally she'll feel off balance even when she is not dizzy. She has noticed some ongoing changes with her memory. She does feel that she is "more forgetful." She states that she lays things down and tends to forget where she put them. Occasionally she will say "things backwards." She reports that she does drive but refrains from driinge when she has a headache. She states that her brother usually drives her places. She reports being diabetic and having some discomfort in the feet. She reports that the location of her headache has not changed since her fall. She did have a CT scan after her fall that was unremarkable. She denies any trouble sleeping. Denies snoring. Denies any witnessed apnea events.She returns today for an evaluation.  HISTORY Ms. Beth Meza is a 70 year old female with a history of headaches and vertigo. She returns today for follow-up. She states that Effexor has been fitted Radiographer, therapeuticisher for her headaches. She states that she did suffer a fall in March. She states that she fell and hit the left side of her face on a tree. She states  that since then she feels like her headaches and dizziness has increased slightly. However she still feels that this is manageable at this time. In the past she has been on Topamax but that calls some issues with cognition. She states that she can control her dizziness by "stopping" what she is doing and "trying to calm down." She returns today for an evaluation.  HISTORY 09/23/15: Ms. Beth Meza is a 70 year old female with a history of headaches and vertigo. She returns today for follow-up. The patient states that she did go to physical therapy for vestibular rehabilitation and found it beneficial for her vertigo. She states that she will occasionally still get dizzy if she turns over to the right or looks up. She continues to do the exercises that they taught her at home. The patient also feels that her headaches have improved since starting Effexor. She states that she rarely has a headache but if she does it occurs in the frontal region. She does have photophobia and intermittent phonophobia. Denies nausea or vomiting. Overall the patient feels that she is doing much better. She denies any new neurological symptoms. She returns today for an evaluation.  HISTORY 05/26/15 (WILLIS): Ms. Beth Meza is a 70 year old right-handed black female with history of frequent headaches. The patient recently was diagnosed with diabetes in March 2016, she is on metformin at this time. She indicates that after she went on metformin, her headaches seem to increase in frequency. She has had a very poor appetite with some weight loss starting the medication, and she has  started supplementing her diet with Boost. The patient has had increased problems with dizziness over the last month, she will have vertigo when she lies down and rolls to one side or the other, and she indicates that she is not sleeping well at night. The patient will have some occasional nausea with the vertigo. The patient denies any recent falls. She has headaches  mainly in the frontal areas. She continues to have some word finding problems on the Topamax, split dosing the medication has not helped. The patient could not get much benefit with Trokendi and she stopped the medication   REVIEW OF SYSTEMS: Out of a complete 14 system review of symptoms, the patient complains only of the following symptoms, and all other reviewed systems are negative.  ALLERGIES: No Known Allergies  HOME MEDICATIONS: Outpatient Medications Prior to Visit  Medication Sig Dispense Refill  . amLODipine (NORVASC) 5 MG tablet Take 5 mg by mouth daily.    . ferrous sulfate 325 (65 FE) MG tablet Take 325 mg by mouth daily with breakfast.    . lisinopril-hydrochlorothiazide (PRINZIDE,ZESTORETIC) 20-25 MG per tablet Take 1 tablet by mouth daily.    . metFORMIN (GLUCOPHAGE) 500 MG tablet Take 500 mg by mouth 2 (two) times daily.  5  . Multiple Vitamin (MULTIVITAMIN) tablet Take 1 tablet by mouth daily.    . naproxen (NAPROSYN) 500 MG tablet Take 500 mg by mouth 2 (two) times daily.  5  . Omega-3 Fatty Acids (FISH OIL) 1000 MG CAPS Take 1 capsule by mouth daily.    . pravastatin (PRAVACHOL) 40 MG tablet Take 40 mg by mouth daily.    . ranitidine (ZANTAC) 150 MG capsule Take 150 mg by mouth 2 (two) times daily.    Marland Kitchen venlafaxine XR (EFFEXOR-XR) 37.5 MG 24 hr capsule TAKE 1 CAPSULE DAILY FOR 2 WEEKS,THEN 2 DAILY 60 capsule 5  . vitamin C (ASCORBIC ACID) 500 MG tablet Take 500 mg by mouth daily.     No facility-administered medications prior to visit.     PAST MEDICAL HISTORY: Past Medical History:  Diagnosis Date  . Anemia   . Back spasm   . Degenerative arthritis   . Diabetes (HCC)    diet controlled  . Dizziness and giddiness 03/07/2013  . Dyslipidemia   . Gastroesophageal reflux disease   . Hand cramps   . Headache(784.0) 03/07/2013  . History of carpal tunnel syndrome    Bilateral  . Hypertension   . Leg cramps   . Memory deficit 02/25/2014  . Seizures (HCC)    as  child- unknown etiology-no meds    PAST SURGICAL HISTORY: Past Surgical History:  Procedure Laterality Date  . ANKLE SURGERY Left    History of ankle fracture  . BACK SURGERY    . COLONOSCOPY N/A 05/04/2014   Procedure: COLONOSCOPY;  Surgeon: West Bali, MD;  Location: [facility];  Service: Endoscopy;  Laterality: N/A;  11:30  . HAND SURGERY     Carpal tunnel syndrome, trigger fingers bilaterally  . KNEE SURGERY Left    Pin, history of fracture  . SHOULDER ACROMIOPLASTY Left 06/10/2014   Procedure: SHOULDER ACROMIOPLASTY;  Surgeon: Vickki Hearing, MD;  Location: [facility];  Service: Orthopedics;  Laterality: Left;  . SHOULDER OPEN ROTATOR CUFF REPAIR Left 04/08/2014   Dr. Romeo Apple  . SHOULDER OPEN ROTATOR CUFF REPAIR Left 06/10/2014   Procedure: REVISION ROTATOR CUFF REPAIR SHOULDER OPEN WITH GRAFT;  Surgeon: Vickki Hearing, MD;  Location: [facility]  [facility];  Service: Orthopedics;  Laterality: Left;    FAMILY HISTORY: Family History  Problem Relation Age of Onset  . Heart attack Father   . Colon cancer Neg Hx     SOCIAL HISTORY: Social History   Social History  . Marital status: Divorced    Spouse name: N/A  . Number of children: 3  . Years of education: 14   Occupational History  . Retired     [employer]   Social History Main Topics  . Smoking status: Never Smoker  . Smokeless tobacco: Never Used  . Alcohol use No  . Drug use: No  . Sexual activity: Yes    Birth control/ protection: Post-menopausal   Other Topics Concern  . Not on file   Social History Narrative   Patient is right handed.   Patient does not drink caffeine.      PHYSICAL EXAM  Vitals:   05/24/16 0832  BP: 115/71  Pulse: 82  Weight: 164 lb 9.6 oz (74.7 kg)  Height: 5\' 6"  (1.676 m)   Body mass index is 26.57 kg/m.     Generalized: Well developed, in no acute distress   Neurological examination  Mentation: Alert oriented to time, place, history taking. Follows all commands speech  and language fluent. MMSE 26/30- missed 2 on calculations and 2 on recall. Cranial nerve II-XII: Pupils were equal round reactive to light. Extraocular movements were full, visual field were full on confrontational test. Facial sensation and strength were normal. Uvula tongue midline. Head turning and shoulder shrug  were normal and symmetric. Motor: The motor testing reveals 5 over 5 strength of all 4 extremities. Good symmetric motor tone is noted throughout.  Sensory: Sensory testing is intact to soft touch on all 4 extremities. No evidence of extinction is noted.  Coordination: Cerebellar testing reveals good finger-nose-finger and heel-to-shin bilaterally.  Gait and station: Gait is normal. Tandem gait is normal. Romberg is negative. No drift is seen.  Reflexes: Deep tendon reflexes are symmetric and normal bilaterally.   DIAGNOSTIC DATA (LABS, IMAGING, TESTING) - I reviewed patient records, labs, notes, testing and imaging myself where available.  ASSESSMENT AND PLAN 70 y.o. year old female  has a past medical history of Anemia; Back spasm; Degenerative arthritis; Diabetes (HCC); Dizziness and giddiness (03/07/2013); Dyslipidemia; Gastroesophageal reflux disease; Hand cramps; YNWGNFAO(130.8Headache(784.0) (03/07/2013); History of carpal tunnel syndrome; Hypertension; Leg cramps; Memory deficit (02/25/2014); and Seizures (HCC). here with:  1. Migraine headaches 2. Vertigo associated with headaches 3. Memory disturbance  The patient has had ongoing headaches that occur daily since her fall. She does not report the headache worsening. Her exam today is relatively unremarkable. Her MMSE is 26/30. We will continue to monitor this. She will continue on Effexor. I will start the patient on Zonegran. In the past she has gotten good benefit with Topamax but was unable to tolerate due to cognition changes. She will try Zonegran 25 mg at bedtime. I have reviewed the side effects with the patient. She verbalized  understanding. If she is unable to tolerate this medication or does not find it beneficial she she'll let us know. She will follow-up in 3 months or sooner if needed.     Butch PennyMegan , MSN, NP-C 05/24/2016, 8:35 AM [facility] [address], [address] [place], [place] [address] [phone]

## 2016-08-29 ENCOUNTER — Ambulatory Visit: Payer: Medicare Other | Admitting: Adult Health

## 2016-09-03 NOTE — Progress Notes (Signed)
PATIENT: Beth Meza DOB: 10-Apr-1946  REASON FOR VISIT: follow up- headaches, vertigo, memory HISTORY FROM: patient  HISTORY OF PRESENT ILLNESS: Beth Meza ia a 70 year old female with a history of headaches, vertigo, memory . She returns today for follow-up. She was started on Zonegran 25 mg at bedtime. She reports that her symptoms have improved with zonegran. She states that her vertigo episodes have become less frequent. She states before Zonegran she would have 3-4 episodes a day however now she only has maybe one episode a day but it is very brief. She states her dizziness normally is associated with some type of movement. Usually sitting up in bed or rolling over. She reports that she does not have many headaches. She reports that headaches normally consist of pressure above the eyes. She feels that her memory has remained stable. She is able to complete all ADLs independently. 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   HISTORY 05/24/16: Beth Meza is a 71 year old female with a history of headaches and vertigo. She returns today for follow-up. She is on Effexor and has been tolerating that well. She reports that since her fall in March her headaches initially got worse but never improved. She states she typically has a headache in the morning. Her headaches are located on the left side of the face. Around the eye and in the temporal region. She reports that  the right parietal region is tender to touch. She denies photophobia and phonophobia. She does report some nausea but no vomiting. She states her vertigo usually occurs with all headaches. It usually does not occur independently. She states that during episodes of vertigo she does feel off balance. Occasionally she'll feel off balance even when she is not dizzy. She has noticed some ongoing changes with her memory. She does feel that she is "more forgetful." She states that she lays things  down and tends to forget where she put them. Occasionally she will say "things backwards." She reports that she does drive but refrains from driinge when she has a headache. She states that her brother usually drives her places. She reports being diabetic and having some discomfort in the feet. She reports that the location of her headache has not changed since her fall. She did have a CT scan after her fall that was unremarkable. She denies any trouble sleeping. Denies snoring. Denies any witnessed apnea events.She returns today for an evaluation.  HISTORY Beth Meza is a 70 year old female with a history of headaches and vertigo. She returns today for follow-up. She states that Effexor has been fitted Radiographer, therapeutic for her headaches. She states that she did suffer a fall in March. She states that she fell and hit the left side of her face on a tree. She states that since then she feels like her headaches and dizziness has increased slightly. However she still feels that this is manageable at this time. In the past she has been on Topamax but that calls some issues with cognition. She states that she can control her dizziness by "stopping" what she is doing and "trying to calm down." She returns today for an evaluation.  HISTORY 09/23/15: Beth Meza is a 70 year old female with a history of headaches and vertigo. She returns today for follow-up. The patient states that she did go to physical therapy for vestibular rehabilitation and found it beneficial for her vertigo. She states that she will occasionally still get dizzy if  she turns over to the right or looks up. She continues to do the exercises that they taught her at home. The patient also feels that her headaches have improved since starting Effexor. She states that she rarely has a headache but if she does it occurs in the frontal region. She does have photophobia and intermittent phonophobia. Denies nausea or vomiting. Overall the patient feels that she is  doing much better. She denies any new neurological symptoms. She returns today for an evaluation.  HISTORY 05/26/15 (WILLIS): Beth Meza is a 70 year old right-handed black female with history of frequent headaches. The patient recently was diagnosed with diabetes in March 2016, she is on metformin at this time. She indicates that after she went on metformin, her headaches seem to increase in frequency. She has had a very poor appetite with some weight loss starting the medication, and she has started supplementing her diet with Boost. The patient has had increased problems with dizziness over the last month, she will have vertigo when she lies down and rolls to one side or the other, and she indicates that she is not sleeping well at night. The patient will have some occasional nausea with the vertigo. The patient denies any recent falls. She has headaches mainly in the frontal areas. She continues to have some word finding problems on the Topamax, split dosing the medication has not helped. The patient could not get much benefit with Trokendi and she stopped the medication   REVIEW OF SYSTEMS: Out of a complete 14 system review of symptoms, the patient complains only of the following symptoms, and all other reviewed systems are negative.  Aching muscles, back pain, chest pain, hearing loss  ALLERGIES: No Known Allergies  HOME MEDICATIONS: Outpatient Medications Prior to Visit  Medication Sig Dispense Refill  . amLODipine (NORVASC) 5 MG tablet Take 5 mg by mouth daily.    . ferrous sulfate 325 (65 FE) MG tablet Take 325 mg by mouth daily with breakfast.    . lisinopril-hydrochlorothiazide (PRINZIDE,ZESTORETIC) 20-25 MG per tablet Take 1 tablet by mouth daily.    . metFORMIN (GLUCOPHAGE) 500 MG tablet Take 500 mg by mouth 2 (two) times daily.  5  . Multiple Vitamin (MULTIVITAMIN) tablet Take 1 tablet by mouth daily.    . naproxen (NAPROSYN) 500 MG tablet Take 500 mg by mouth 2 (two) times daily.   5  . Omega-3 Fatty Acids (FISH OIL) 1000 MG CAPS Take 1 capsule by mouth daily.    . pravastatin (PRAVACHOL) 40 MG tablet Take 40 mg by mouth daily.    . ranitidine (ZANTAC) 150 MG capsule Take 150 mg by mouth 2 (two) times daily.    Marland Kitchen. venlafaxine XR (EFFEXOR-XR) 37.5 MG 24 hr capsule TAKE 2 Capsules PO daily. 60 capsule 5  . vitamin C (ASCORBIC ACID) 500 MG tablet Take 500 mg by mouth daily.    Marland Kitchen. zonisamide (ZONEGRAN) 25 MG capsule Take 1 capsule (25 mg total) by mouth daily. 30 capsule 5   No facility-administered medications prior to visit.     PAST MEDICAL HISTORY: Past Medical History:  Diagnosis Date  . Anemia   . Back spasm   . Degenerative arthritis   . Diabetes (HCC)    diet controlled  . Dizziness and giddiness 03/07/2013  . Dyslipidemia   . Gastroesophageal reflux disease   . Hand cramps   . Headache(784.0) 03/07/2013  . History of carpal tunnel syndrome    Bilateral  . Hypertension   .  Leg cramps   . Memory deficit 02/25/2014  . Seizures (HCC)    as child- unknown etiology-no meds    PAST SURGICAL HISTORY: Past Surgical History:  Procedure Laterality Date  . ANKLE SURGERY Left    History of ankle fracture  . BACK SURGERY    . COLONOSCOPY N/A 05/04/2014   Procedure: COLONOSCOPY;  Surgeon: West Bali, MD;  Location: [facility];  Service: Endoscopy;  Laterality: N/A;  11:30  . HAND SURGERY     Carpal tunnel syndrome, trigger fingers bilaterally  . KNEE SURGERY Left    Pin, history of fracture  . SHOULDER ACROMIOPLASTY Left 06/10/2014   Procedure: SHOULDER ACROMIOPLASTY;  Surgeon: Vickki Hearing, MD;  Location: [facility];  Service: Orthopedics;  Laterality: Left;  . SHOULDER OPEN ROTATOR CUFF REPAIR Left 04/08/2014   Dr. Romeo Apple  . SHOULDER OPEN ROTATOR CUFF REPAIR Left 06/10/2014   Procedure: REVISION ROTATOR CUFF REPAIR SHOULDER OPEN WITH GRAFT;  Surgeon: Vickki Hearing, MD;  Location: [facility];  Service: Orthopedics;  Laterality: Left;    FAMILY  HISTORY: Family History  Problem Relation Age of Onset  . Heart attack Father   . Colon cancer Neg Hx     SOCIAL HISTORY: Social History   Social History  . Marital status: Divorced    Spouse name: N/A  . Number of children: 3  . Years of education: 14   Occupational History  . Retired     Agricultural consultant   Social History Main Topics  . Smoking status: Never Smoker  . Smokeless tobacco: Never Used  . Alcohol use No  . Drug use: No  . Sexual activity: Yes    Birth control/ protection: Post-menopausal   Other Topics Concern  . Not on file   Social History Narrative   Patient is right handed.   Patient does not drink caffeine.      PHYSICAL EXAM  Vitals:   09/04/16 0755  BP: 116/74  Pulse: 82  Weight: 165 lb 3.2 oz (74.9 kg)  Height: 5\' 6"  (1.676 m)   Body mass index is 26.66 kg/m.   MMSE - Mini Mental State Exam 09/04/2016 09/23/2015 05/26/2015  Orientation to time 5 5 5   Orientation to Place 5 5 5   Registration 3 3 3   Attention/ Calculation 3 5 5   Recall 2 2 1   Language- name 2 objects 2 2 2   Language- repeat 1 1 1   Language- follow 3 step command 3 3 2   Language- read & follow direction 1 1 1   Write a sentence 1 1 1   Copy design 1 1 1   Total score 27 29 27      Generalized: Well developed, in no acute distress   Neurological examination  Mentation: Alert oriented to time, place, history taking. Follows all commands speech and language fluent Cranial nerve II-XII: Pupils were equal round reactive to light. Extraocular movements were full, visual field were full on confrontational test. Facial sensation and strength were normal. Uvula tongue midline. Head turning and shoulder shrug  were normal and symmetric. Motor: The motor testing reveals 5 over 5 strength of all 4 extremities. Good symmetric motor tone is noted throughout.  Sensory: Sensory testing is intact to soft touch on all 4 extremities. No evidence of extinction is noted.  Coordination:  Cerebellar testing reveals good finger-nose-finger and heel-to-shin bilaterally.  Gait and station: Gait is normal. Tandem gait is normal. Romberg is negative. No drift is seen.  Reflexes: Deep tendon reflexes are  symmetric and normal bilaterally.   DIAGNOSTIC DATA (LABS, IMAGING, TESTING) - I reviewed patient records, labs, notes, testing and imaging myself where available.  Lab Results  Component Value Date   WBC 11.1 (H) 11/04/2007   HGB 11.3 (L) 06/05/2014   HCT 32.7 (L) 06/05/2014   MCV 80.2 11/04/2007   PLT 269 11/04/2007      Component Value Date/Time   NA 141 06/05/2014 1015   K 4.4 06/05/2014 1015   CL 104 06/05/2014 1015   CO2 24 06/05/2014 1015   GLUCOSE 94 06/05/2014 1015   BUN 19 06/05/2014 1015   CREATININE 1.40 (H) 06/05/2014 1015   CREATININE 1.88 (H) 05/12/2014 1040   CALCIUM 9.4 06/05/2014 1015   PROT 9.0 (H) 11/04/2007 0009   ALBUMIN 4.2 11/04/2007 0009   AST 115 (H) 11/04/2007 0009   ALT 74 (H) 11/04/2007 0009   ALKPHOS 62 11/04/2007 0009   BILITOT 0.7 11/04/2007 0009   GFRNONAA 38 (L) 06/05/2014 1015   GFRAA 44 (L) 06/05/2014 1015     ASSESSMENT AND PLAN 70 y.o. year old female  has a past medical history of Anemia; Back spasm; Degenerative arthritis; Diabetes (HCC); Dizziness and giddiness (03/07/2013); Dyslipidemia; Gastroesophageal reflux disease; Hand cramps; ZOXWRUEA(540.9Headache(784.0) (03/07/2013); History of carpal tunnel syndrome; Hypertension; Leg cramps; Memory deficit (02/25/2014); and Seizures (HCC). here with:  1. Vertigo 2. Headache 3. Memory disturbance  Overall the patient has remained stable. She reports that her vertigo episodes have decreased with zonegran. At this time she does not want to increase the medication. She will stay on Zonegran 25 mg at bedtime. Her memory score is slightly declined, for now we will continue to monitor. Advised that if her symptoms worsen or she develops any new symptoms she should let us know. Follow-up in 6 months or  sooner if needed.     Butch PennyMegan , MSN, NP-C 09/03/2016, 7:57 PM [facility] [address], [address] [place], [place] [address] [phone]

## 2016-09-04 ENCOUNTER — Encounter: Payer: Self-pay | Admitting: Adult Health

## 2016-09-04 ENCOUNTER — Ambulatory Visit (INDEPENDENT_AMBULATORY_CARE_PROVIDER_SITE_OTHER): Payer: Medicare Other | Admitting: Adult Health

## 2016-09-04 VITALS — BP 116/74 | HR 82 | Ht 66.0 in | Wt 165.2 lb

## 2016-09-04 DIAGNOSIS — R51 Headache: Secondary | ICD-10-CM | POA: Diagnosis not present

## 2016-09-04 DIAGNOSIS — R519 Headache, unspecified: Secondary | ICD-10-CM

## 2016-09-04 DIAGNOSIS — H8110 Benign paroxysmal vertigo, unspecified ear: Secondary | ICD-10-CM

## 2016-09-04 DIAGNOSIS — R413 Other amnesia: Secondary | ICD-10-CM | POA: Diagnosis not present

## 2016-09-04 MED ORDER — ZONISAMIDE 25 MG PO CAPS: 25.0000 mg | ORAL_CAPSULE | Freq: Every day | ORAL | 11 refills | Status: DC

## 2016-09-04 NOTE — Patient Instructions (Signed)
  Overall you are doing well. Continue Zonegran 25 mg at bedtime Memory score is stable- will continue to monitor If your symptoms worsen or you develop new symptoms please let us knKoreaow.   You may get a survey in the mail. We would love for you to feel this out and give us high scores if we are deserving!  Please call with any questions or concerns!

## 2016-09-04 NOTE — Progress Notes (Signed)
I have read the note, and I agree with the clinical assessment and plan.  , KEITH   

## 2016-09-25 ENCOUNTER — Ambulatory Visit: Payer: Medicare Other | Admitting: Adult Health

## 2016-11-20 ENCOUNTER — Other Ambulatory Visit: Payer: Self-pay | Admitting: Adult Health

## 2016-12-05 ENCOUNTER — Ambulatory Visit (HOSPITAL_COMMUNITY)
Admission: RE | Admit: 2016-12-05 | Discharge: 2016-12-05 | Disposition: A | Discharge status: Home / Self Care | Payer: Medicare Other | Source: Ambulatory Visit | Attending: Family Medicine | Admitting: Family Medicine

## 2016-12-05 ENCOUNTER — Other Ambulatory Visit (HOSPITAL_COMMUNITY): Payer: Self-pay | Admitting: Family Medicine

## 2016-12-05 DIAGNOSIS — M5136 Other intervertebral disc degeneration, lumbar region: Secondary | ICD-10-CM | POA: Diagnosis not present

## 2016-12-05 DIAGNOSIS — M4807 Spinal stenosis, lumbosacral region: Secondary | ICD-10-CM | POA: Insufficient documentation

## 2016-12-05 DIAGNOSIS — M545 Low back pain: Secondary | ICD-10-CM | POA: Diagnosis not present

## 2016-12-05 DIAGNOSIS — Z9889 Other specified postprocedural states: Secondary | ICD-10-CM | POA: Diagnosis not present

## 2016-12-05 DIAGNOSIS — M5442 Lumbago with sciatica, left side: Secondary | ICD-10-CM

## 2017-02-12 ENCOUNTER — Ambulatory Visit (INDEPENDENT_AMBULATORY_CARE_PROVIDER_SITE_OTHER): Payer: Medicare Other | Admitting: Adult Health

## 2017-02-12 ENCOUNTER — Encounter (INDEPENDENT_AMBULATORY_CARE_PROVIDER_SITE_OTHER): Payer: Self-pay

## 2017-02-12 ENCOUNTER — Encounter: Payer: Self-pay | Admitting: Adult Health

## 2017-02-12 VITALS — BP 118/74 | HR 75 | Ht 66.0 in | Wt 169.8 lb

## 2017-02-12 DIAGNOSIS — R42 Dizziness and giddiness: Secondary | ICD-10-CM

## 2017-02-12 DIAGNOSIS — R413 Other amnesia: Secondary | ICD-10-CM

## 2017-02-12 DIAGNOSIS — R51 Headache: Secondary | ICD-10-CM | POA: Diagnosis not present

## 2017-02-12 DIAGNOSIS — R519 Headache, unspecified: Secondary | ICD-10-CM

## 2017-02-12 NOTE — Progress Notes (Signed)
PATIENT: Beth Meza DOB: 04-23-1946  REASON FOR VISIT: follow up- headache, vertigo, memory  HISTORY FROM: patient  HISTORY OF PRESENT ILLNESS: Beth Meza is a 71 year old female with a history of headaches, vertigo and memory disturbance. She returns today for follow-up. Overall the patient states that she is done well. She continues on Zonegran 25 mg at bedtime. She denies any episodes of vertigo. She states that her headaches are under good control. She states that she did hurt her back and her primary care gave her oxycodone. She states that she was unable to tolerate this medication. It caused her to feel dizzy and have hallucinations. She feels that her memory has remained stable. She returns today for an evaluation.  HISTORY 09/04/16: Beth Meza ia a 71 year old female with a history of headaches, vertigo, memory . She returns today for follow-up. She was started on Zonegran 25 mg at bedtime. She reports that her symptoms have improved with zonegran. She states that her vertigo episodes have become less frequent. She states before Zonegran she would have 3-4 episodes a day however now she only has maybe one episode a day but it is very brief. She states her dizziness normally is associated with some type of movement. Usually sitting up in bed or rolling over. She reports that she does not have many headaches. She reports that headaches normally consist of pressure above the eyes. She feels that her memory has remained stable. She is able to complete all ADLs independently. 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   HISTORY 05/24/16: Beth Meza is a 71 year old female with a history of headaches and vertigo. She returns today for follow-up. She is on Effexor and has been tolerating that well. She reports that since her fall in March her headaches initially got worse but never improved. She states she typically has a headache in the morning. Her  headaches are located on the left side of the face. Around the eye and in the temporal region. She reports that the right parietal region is tender to touch. She denies photophobia and phonophobia. She does report some nausea but no vomiting. She states her vertigo usually occurs with all headaches. It usually does not occur independently. She states that during episodes of vertigo she does feel off balance. Occasionally she'll feel off balance even when she is not dizzy. She has noticed some ongoing changes with her memory. She does feel that she is "more forgetful." She states that she laysthings down and tends to forget where she put them. Occasionally she will say "things backwards." She reports that she does drive but refrains fromdriinge when she has a headache. She states that her brother usually drives her places. She reports being diabetic and having some discomfort in the feet. She reports that the location of her headache has not changed since her fall. She did have a CT scan after her fall that was unremarkable. She denies any trouble sleeping. Denies snoring. Denies any witnessed apnea events.She returns today for an evaluation.  REVIEW OF SYSTEMS: Out of a complete 14 system review of symptoms, the patient complains only of the following symptoms, and all other reviewed systems are negative.  Eye redness, cough, shortness of breath, constipation, aching muscles, walking difficult to, back pain, sleep talking, memory loss, dizziness, confusion, hallucinations, bruise easily, eye itching, chills, fatigue  ALLERGIES: No Known Allergies  HOME MEDICATIONS: Outpatient Medications Prior to Visit  Medication Sig Dispense  Refill  . amLODipine (NORVASC) 5 MG tablet Take 5 mg by mouth daily.    . ferrous sulfate 325 (65 FE) MG tablet Take 325 mg by mouth daily with breakfast.    . lisinopril-hydrochlorothiazide (PRINZIDE,ZESTORETIC) 20-25 MG per tablet Take 1 tablet by mouth daily.    .  metFORMIN (GLUCOPHAGE) 500 MG tablet Take 500 mg by mouth 2 (two) times daily.  5  . Multiple Vitamin (MULTIVITAMIN) tablet Take 1 tablet by mouth daily.    . naproxen (NAPROSYN) 500 MG tablet Take 500 mg by mouth 2 (two) times daily.  5  . Omega-3 Fatty Acids (FISH OIL) 1000 MG CAPS Take 1 capsule by mouth daily.    . pravastatin (PRAVACHOL) 40 MG tablet Take 40 mg by mouth daily.    . ranitidine (ZANTAC) 150 MG capsule Take 150 mg by mouth 2 (two) times daily.    Marland Kitchen venlafaxine XR (EFFEXOR-XR) 37.5 MG 24 hr capsule Take 2 capsules (75 mg total) by mouth daily with breakfast. 60 capsule 5  . vitamin C (ASCORBIC ACID) 500 MG tablet Take 500 mg by mouth daily.    Marland Kitchen zonisamide (ZONEGRAN) 25 MG capsule Take 1 capsule (25 mg total) by mouth daily. 30 capsule 11  . venlafaxine XR (EFFEXOR-XR) 37.5 MG 24 hr capsule TAKE 2 Capsules PO daily. 60 capsule 5   No facility-administered medications prior to visit.     PAST MEDICAL HISTORY: Past Medical History:  Diagnosis Date  . Anemia   . Back spasm   . Degenerative arthritis   . Diabetes (HCC)    diet controlled  . Dizziness and giddiness 03/07/2013  . Dyslipidemia   . Gastroesophageal reflux disease   . Hand cramps   . Headache(784.0) 03/07/2013  . History of carpal tunnel syndrome    Bilateral  . Hypertension   . Leg cramps   . Memory deficit 02/25/2014  . Seizures (HCC)    as child- unknown etiology-no meds    PAST SURGICAL HISTORY: Past Surgical History:  Procedure Laterality Date  . ANKLE SURGERY Left    History of ankle fracture  . BACK SURGERY    . COLONOSCOPY N/A 05/04/2014   Procedure: COLONOSCOPY;  Surgeon: West Bali, MD;  Location: [facility];  Service: Endoscopy;  Laterality: N/A;  11:30  . HAND SURGERY     Carpal tunnel syndrome, trigger fingers bilaterally  . KNEE SURGERY Left    Pin, history of fracture  . SHOULDER ACROMIOPLASTY Left 06/10/2014   Procedure: SHOULDER ACROMIOPLASTY;  Surgeon: Vickki Hearing,  MD;  Location: [facility];  Service: Orthopedics;  Laterality: Left;  . SHOULDER OPEN ROTATOR CUFF REPAIR Left 04/08/2014   Dr. Romeo Apple  . SHOULDER OPEN ROTATOR CUFF REPAIR Left 06/10/2014   Procedure: REVISION ROTATOR CUFF REPAIR SHOULDER OPEN WITH GRAFT;  Surgeon: Vickki Hearing, MD;  Location: [facility];  Service: Orthopedics;  Laterality: Left;    FAMILY HISTORY: Family History  Problem Relation Age of Onset  . Heart attack Father   . Colon cancer Neg Hx     SOCIAL HISTORY: Social History   Social History  . Marital status: Divorced    Spouse name: N/A  . Number of children: 3  . Years of education: 14   Occupational History  . Retired     Agricultural consultant   Social History Main Topics  . Smoking status: Never Smoker  . Smokeless tobacco: Never Used  . Alcohol use No  . Drug use: No  .  Sexual activity: Yes    Birth control/ protection: Post-menopausal   Other Topics Concern  . Not on file   Social History Narrative   Patient is right handed.   Patient does not drink caffeine.      PHYSICAL EXAM  Vitals:   02/12/17 1454  BP: 118/74  Pulse: 75  Weight: 169 lb 12.8 oz (77 kg)  Height: 5\' 6"  (1.676 m)   Body mass index is 27.41 kg/m.   MMSE - Mini Mental State Exam 02/12/2017 09/04/2016 09/23/2015  Orientation to time 5 5 5   Orientation to Place 5 5 5   Registration 3 3 3   Attention/ Calculation 2 3 5   Recall 1 2 2   Language- name 2 objects 2 2 2   Language- repeat 1 1 1   Language- follow 3 step command 3 3 3   Language- read & follow direction 1 1 1   Write a sentence 1 1 1   Copy design 1 1 1   Total score 25 27 29      Generalized: Well developed, in no acute distress   Neurological examination  Mentation: Alert oriented to time, place, history taking. Follows all commands speech and language fluent Cranial nerve II-XII: Pupils were equal round reactive to light. Extraocular movements were full, visual field were full on confrontational test. Facial sensation  and strength were normal. Uvula tongue midline. Head turning and shoulder shrug  were normal and symmetric. Motor: The motor testing reveals 5 over 5 strength of all 4 extremities. Good symmetric motor tone is noted throughout.  Sensory: Sensory testing is intact to soft touch on all 4 extremities. No evidence of extinction is noted.  Coordination: Cerebellar testing reveals good finger-nose-finger and heel-to-shin bilaterally.  Gait and station: Patient uses a cane when ambulating. Tandem gait not attempted. Romberg is negative. Reflexes: Deep tendon reflexes are symmetric and normal bilaterally.   DIAGNOSTIC DATA (LABS, IMAGING, TESTING) - I reviewed patient records, labs, notes, testing and imaging myself where available.  Lab Results  Component Value Date   WBC 11.1 (H) 11/04/2007   HGB 11.3 (L) 06/05/2014   HCT 32.7 (L) 06/05/2014   MCV 80.2 11/04/2007   PLT 269 11/04/2007      Component Value Date/Time   NA 141 06/05/2014 1015   K 4.4 06/05/2014 1015   CL 104 06/05/2014 1015   CO2 24 06/05/2014 1015   GLUCOSE 94 06/05/2014 1015   BUN 19 06/05/2014 1015   CREATININE 1.40 (H) 06/05/2014 1015   CREATININE 1.88 (H) 05/12/2014 1040   CALCIUM 9.4 06/05/2014 1015   PROT 9.0 (H) 11/04/2007 0009   ALBUMIN 4.2 11/04/2007 0009   AST 115 (H) 11/04/2007 0009   ALT 74 (H) 11/04/2007 0009   ALKPHOS 62 11/04/2007 0009   BILITOT 0.7 11/04/2007 0009   GFRNONAA 38 (L) 06/05/2014 1015   GFRAA 44 (L) 06/05/2014 1015     ASSESSMENT AND PLAN 71 y.o. year old female  has a past medical history of Anemia; Back spasm; Degenerative arthritis; Diabetes (HCC); Dizziness and giddiness (03/07/2013); Dyslipidemia; Gastroesophageal reflux disease; Hand cramps; QMVHQION(629.5) (03/07/2013); History of carpal tunnel syndrome; Hypertension; Leg cramps; Memory deficit (02/25/2014); and Seizures (HCC). here with:  1. Headache 2. Vertigo 3. Memory disturbance  Overall the patient is doing well. She will  continue on Zonegran 25 mg at bedtime. She is advised that if her symptoms worsen or she develops new symptoms she should let us know. She will follow-up in 6 months or sooner if needed.  Butch PennyMegan , MSN, NP-C 02/12/2017, 3:03 PM [facility] [address], [address] [place], [place] [address] [phone]

## 2017-02-12 NOTE — Progress Notes (Signed)
I have read the note, and I agree with the clinical assessment and plan.  , KEITH   

## 2017-02-12 NOTE — Patient Instructions (Signed)
Continue Zonegran If your symptoms worsen or you develop new symptoms please let us know.   

## 2017-03-06 ENCOUNTER — Ambulatory Visit: Payer: Medicare Other | Admitting: Adult Health

## 2017-04-17 ENCOUNTER — Other Ambulatory Visit (HOSPITAL_COMMUNITY): Payer: Self-pay | Admitting: Family Medicine

## 2017-04-17 ENCOUNTER — Ambulatory Visit (HOSPITAL_COMMUNITY)
Admission: RE | Admit: 2017-04-17 | Discharge: 2017-04-17 | Disposition: A | Discharge status: Home / Self Care | Payer: Medicare Other | Source: Ambulatory Visit | Attending: Family Medicine | Admitting: Family Medicine

## 2017-04-17 DIAGNOSIS — R0781 Pleurodynia: Secondary | ICD-10-CM | POA: Insufficient documentation

## 2017-04-17 DIAGNOSIS — R52 Pain, unspecified: Secondary | ICD-10-CM

## 2017-05-24 ENCOUNTER — Other Ambulatory Visit: Payer: Self-pay | Admitting: *Deleted

## 2017-05-24 MED ORDER — VENLAFAXINE HCL ER 37.5 MG PO CP24: 75.0000 mg | ORAL_CAPSULE | Freq: Every day | ORAL | 5 refills | Status: DC

## 2017-06-18 ENCOUNTER — Other Ambulatory Visit (HOSPITAL_COMMUNITY): Payer: Self-pay | Admitting: Family Medicine

## 2017-06-18 DIAGNOSIS — M545 Low back pain: Secondary | ICD-10-CM

## 2017-06-19 ENCOUNTER — Ambulatory Visit (HOSPITAL_COMMUNITY)
Admission: RE | Admit: 2017-06-19 | Discharge: 2017-06-19 | Disposition: A | Discharge status: Home / Self Care | Payer: Medicare Other | Source: Ambulatory Visit | Attending: Family Medicine | Admitting: Family Medicine

## 2017-06-19 DIAGNOSIS — M47816 Spondylosis without myelopathy or radiculopathy, lumbar region: Secondary | ICD-10-CM | POA: Insufficient documentation

## 2017-06-19 DIAGNOSIS — M545 Low back pain: Secondary | ICD-10-CM

## 2017-06-19 DIAGNOSIS — M48061 Spinal stenosis, lumbar region without neurogenic claudication: Secondary | ICD-10-CM | POA: Insufficient documentation

## 2017-08-18 ENCOUNTER — Emergency Department (HOSPITAL_COMMUNITY)
Admission: EM | Admit: 2017-08-18 | Discharge: 2017-08-18 | Disposition: A | Discharge status: Home / Self Care | Payer: Medicare Other | Attending: Emergency Medicine | Admitting: Emergency Medicine

## 2017-08-18 ENCOUNTER — Emergency Department (HOSPITAL_COMMUNITY): Payer: Medicare Other

## 2017-08-18 ENCOUNTER — Encounter (HOSPITAL_COMMUNITY): Payer: Self-pay | Admitting: Emergency Medicine

## 2017-08-18 ENCOUNTER — Other Ambulatory Visit: Payer: Self-pay

## 2017-08-18 DIAGNOSIS — Z79899 Other long term (current) drug therapy: Secondary | ICD-10-CM | POA: Diagnosis not present

## 2017-08-18 DIAGNOSIS — I10 Essential (primary) hypertension: Secondary | ICD-10-CM | POA: Diagnosis not present

## 2017-08-18 DIAGNOSIS — E119 Type 2 diabetes mellitus without complications: Secondary | ICD-10-CM | POA: Insufficient documentation

## 2017-08-18 DIAGNOSIS — M542 Cervicalgia: Secondary | ICD-10-CM | POA: Diagnosis not present

## 2017-08-18 DIAGNOSIS — Z7984 Long term (current) use of oral hypoglycemic drugs: Secondary | ICD-10-CM | POA: Diagnosis not present

## 2017-08-18 MED ORDER — KETOROLAC TROMETHAMINE 60 MG/2ML IM SOLN
60.0000 mg | Freq: Once | INTRAMUSCULAR | Status: AC
  Administered 2017-08-18: 60 mg via INTRAMUSCULAR
  Filled 2017-08-18: qty 2

## 2017-08-18 MED ORDER — KETOROLAC TROMETHAMINE 60 MG/2ML IM SOLN
INTRAMUSCULAR | Status: AC
  Filled 2017-08-18: qty 2

## 2017-08-18 MED ORDER — OXYCODONE-ACETAMINOPHEN 5-325 MG PO TABS: 1.0000 | ORAL_TABLET | ORAL | 0 refills | Status: DC | PRN

## 2017-08-18 MED ORDER — CYCLOBENZAPRINE HCL 10 MG PO TABS: 10.0000 mg | ORAL_TABLET | Freq: Two times a day (BID) | ORAL | 0 refills | Status: DC | PRN

## 2017-08-18 MED ORDER — PREDNISONE 10 MG PO TABS: 10.0000 mg | ORAL_TABLET | Freq: Every day | ORAL | 0 refills | Status: DC

## 2017-08-18 MED ORDER — NAPROXEN 500 MG PO TABS: 500.0000 mg | ORAL_TABLET | Freq: Two times a day (BID) | ORAL | 0 refills | Status: DC

## 2017-08-18 NOTE — ED Notes (Signed)
ED Provider at bedside. 

## 2017-08-18 NOTE — ED Triage Notes (Addendum)
Patient c/o neck pain that radiates into her shoulder. Per patient woke with the pain this morning. Pain comes intermittently and is sharp, stabbing, and shooting. Denies any chest pain. Denies any injuries or that pain is worse with movement. Patient does have back spams and has appointment for steroid injection.

## 2017-08-18 NOTE — ED Provider Notes (Addendum)
[facility] EMERGENCY DEPARTMENT Provider Note   CSN: [id number] Arrival date & time: 08/18/17  1015     History   Chief Complaint Chief Complaint  Patient presents with  . Neck Pain    HPI Beth Meza is a 71 y.o. female.  Patient awoke this morning with left lateral neck pain with radiation to the left shoulder.  No new trauma.  Pain is intermittent and sharp/stabbing in nature.  No chest pain or dyspnea.  Severity is moderate.  Palpation and position make pain worse.      Past Medical History:  Diagnosis Date  . Anemia   . Back spasm   . Degenerative arthritis   . Diabetes (HCC)    diet controlled  . Dizziness and giddiness 03/07/2013  . Dyslipidemia   . Gastroesophageal reflux disease   . Hand cramps   . Headache(784.0) 03/07/2013  . History of carpal tunnel syndrome    Bilateral  . Hypertension   . Leg cramps   . Memory deficit 02/25/2014  . Seizures (HCC)    as child- unknown etiology-no meds    Patient Active Problem List   Diagnosis Date Noted  . Tremor 09/11/2014  . Unspecified constipation 04/30/2014  . Rotator cuff (capsule) sprain 04/13/2014  . Rotator cuff tear, left 02/26/2014  . Memory deficit 02/25/2014  . Dizziness and giddiness 03/07/2013  . Headache 03/07/2013    Past Surgical History:  Procedure Laterality Date  . ANKLE SURGERY Left    History of ankle fracture  . BACK SURGERY    . HAND SURGERY     Carpal tunnel syndrome, trigger fingers bilaterally  . KNEE SURGERY Left    Pin, history of fracture    OB History    No data available       Home Medications    Prior to Admission medications   Medication Sig Start Date End Date Taking? Authorizing Provider  ferrous sulfate 325 (65 FE) MG tablet Take 325 mg by mouth daily with breakfast.   Yes [provider]  lisinopril-hydrochlorothiazide (PRINZIDE,ZESTORETIC) 20-12.5 MG tablet Take 1 tablet by mouth daily.   Yes [provider]  metFORMIN  (GLUCOPHAGE) 500 MG tablet Take 500 mg by mouth 2 (two) times daily. 05/04/15  Yes [provider]  Multiple Vitamin (MULTIVITAMIN) tablet Take 1 tablet by mouth daily.   Yes [provider]  naproxen (NAPROSYN) 500 MG tablet Take 500 mg by mouth 2 (two) times daily. 12/23/15  Yes [provider]  Omega-3 Fatty Acids (FISH OIL) 1000 MG CAPS Take 1 capsule by mouth daily.   Yes [provider]  oxyCODONE (OXY IR/ROXICODONE) 5 MG immediate release tablet Take 10 mg 4 (four) times daily by mouth.  02/06/17  Yes [provider]  pravastatin (PRAVACHOL) 40 MG tablet Take 40 mg by mouth daily. 02/11/14  Yes [provider]  psyllium (REGULOID) 0.52 g capsule Take 0.52 g daily by mouth.   Yes [provider]  ranitidine (ZANTAC) 150 MG capsule Take 150 mg by mouth 2 (two) times daily.   Yes [provider]  vitamin C (ASCORBIC ACID) 500 MG tablet Take 500 mg by mouth daily.   Yes [provider]  zonisamide (ZONEGRAN) 25 MG capsule Take 1 capsule (25 mg total) by mouth daily. 09/04/16  Yes Butch Penny, NP  naproxen (NAPROSYN) 500 MG tablet Take 1 tablet (500 mg total) 2 (two) times daily by mouth. 08/18/17   Donnetta Hutching, MD  predniSONE (  DELTASONE) 10 MG tablet Take 1 tablet (10 mg total) daily with breakfast by mouth. 2 tablets for 4 days, 1 tablet for 4 days 08/18/17   Donnetta Hutchingook, , MD  venlafaxine XR (EFFEXOR-XR) 37.5 MG 24 hr capsule Take 2 capsules (75 mg total) by mouth daily with breakfast. Patient not taking: Reported on 08/18/2017 05/24/17   Butch PennyMillikan, Megan, NP    Family History Family History  Problem Relation Age of Onset  . Heart attack Father   . Colon cancer Neg Hx     Social History Social History   Tobacco Use  . Smoking status: Never Smoker  . Smokeless tobacco: Never Used  Substance Use Topics  . Alcohol use: No  . Drug use: No     Allergies   Patient has no known allergies.   Review of  Systems Review of Systems  All other systems reviewed and are negative.    Physical Exam Updated Vital Signs BP 128/70 (BP Location: Right Arm)   Pulse 93   Temp 98.6 F (37 C) (Oral)   Resp 18   Ht 5' 7.5" (1.715 m)   Wt 74.8 kg (165 lb)   SpO2 100%   BMI 25.46 kg/m   Physical Exam  Constitutional: She is oriented to person, place, and time. She appears well-developed and well-nourished.  HENT:  Head: Normocephalic and atraumatic.  Eyes: Conjunctivae are normal.  Neck:  Tender left lateral neck  Cardiovascular: Normal rate and regular rhythm.  Pulmonary/Chest: Effort normal and breath sounds normal.  Abdominal: Soft. Bowel sounds are normal.  Musculoskeletal: Normal range of motion.  Neurological: She is alert and oriented to person, place, and time.  Skin: Skin is warm and dry.  Psychiatric: She has a normal mood and affect. Her behavior is normal.  Nursing note and vitals reviewed.    ED Treatments / Results  Labs (all labs ordered are listed, but only abnormal results are displayed) Labs Reviewed - No data to display  EKG  EKG Interpretation None       Radiology Dg Cervical Spine 2-3 Views  Result Date: 08/18/2017 CLINICAL DATA:  Cervicalgia EXAM: CERVICAL SPINE - 2-3 VIEW COMPARISON:  None. FINDINGS: Frontal, lateral, and open-mouth odontoid images were obtained. There is no fracture or spondylolisthesis. Prevertebral soft tissues and predental space regions are normal. There is severe disc space narrowing at C5-6. There is moderate disc space narrowing at C4-5 and C6-7. There are prominent anterior osteophytes at C5 and C6. No erosive change. Lung apices are clear. IMPRESSION: Osteoarthritic change at several levels, most severe at C5-6. No fracture or spondylolisthesis. Electronically Signed   By: Bretta BangWilliam  Woodruff III M.D.   On: 08/18/2017 11:59    Procedures Procedures (including critical care time)  Medications Ordered in ED Medications    ketorolac (TORADOL) 60 MG/2ML injection (not administered)  ketorolac (TORADOL) injection 60 mg (60 mg Intramuscular Given 08/18/17 1318)     Initial Impression / Assessment and Plan / ED Course  I have reviewed the triage vital signs and the nursing notes.  Pertinent labs & imaging results that were available during my care of the patient were reviewed by me and considered in my medical decision making (see chart for details).     History of physical most consistent with muscular pain with some nerve involvement.  Plain films of the cervical spine reveal osteoarthritis at several levels most severe at C5-C6.  Will Rx Toradol 60 mg IM and discharge home with Percocet and Flexeril 10  mg  Final Clinical Impressions(s) / ED Diagnoses   Final diagnoses:  Neck pain    ED Discharge Orders        Ordered    naproxen (NAPROSYN) 500 MG tablet  2 times daily     08/18/17 1319    predniSONE (DELTASONE) 10 MG tablet  Daily with breakfast     08/18/17 1319       Donnetta Hutchingook, , MD 08/18/17 1324    Donnetta Hutchingook, , MD 08/18/17 1349

## 2017-08-18 NOTE — ED Notes (Signed)
Placed pt on monitor.

## 2017-08-18 NOTE — Discharge Instructions (Signed)
X-ray shows arthritis of your neck.  This pain is muscular and nerve pain.  Prescription for pain pill and muscle relaxer.  Recommend cold pack.

## 2017-08-18 NOTE — ED Notes (Signed)
Pt state she cannot have naprosyn d/t upcoming spine injections on 08/22/2017.  edp notified

## 2017-08-21 ENCOUNTER — Telehealth: Payer: Self-pay | Admitting: Neurology

## 2017-08-21 ENCOUNTER — Ambulatory Visit: Payer: Medicare Other | Admitting: Neurology

## 2017-08-21 NOTE — Telephone Encounter (Signed)
This patient did not show for a revisit appointment today. 

## 2017-08-22 ENCOUNTER — Encounter: Payer: Self-pay | Admitting: Neurology

## 2017-09-10 ENCOUNTER — Other Ambulatory Visit: Payer: Self-pay | Admitting: Adult Health

## 2017-09-11 ENCOUNTER — Other Ambulatory Visit: Payer: Self-pay | Admitting: Adult Health

## 2017-09-13 ENCOUNTER — Encounter (HOSPITAL_COMMUNITY): Payer: Self-pay | Admitting: Emergency Medicine

## 2017-09-13 ENCOUNTER — Emergency Department (HOSPITAL_COMMUNITY)
Admission: EM | Admit: 2017-09-13 | Discharge: 2017-09-14 | Disposition: A | Discharge status: Home / Self Care | Payer: Medicare Other | Attending: Emergency Medicine | Admitting: Emergency Medicine

## 2017-09-13 ENCOUNTER — Emergency Department (HOSPITAL_COMMUNITY): Payer: Medicare Other

## 2017-09-13 ENCOUNTER — Other Ambulatory Visit: Payer: Self-pay

## 2017-09-13 DIAGNOSIS — S0083XA Contusion of other part of head, initial encounter: Secondary | ICD-10-CM | POA: Insufficient documentation

## 2017-09-13 DIAGNOSIS — S20211A Contusion of right front wall of thorax, initial encounter: Secondary | ICD-10-CM

## 2017-09-13 DIAGNOSIS — Z7984 Long term (current) use of oral hypoglycemic drugs: Secondary | ICD-10-CM | POA: Diagnosis not present

## 2017-09-13 DIAGNOSIS — I10 Essential (primary) hypertension: Secondary | ICD-10-CM | POA: Insufficient documentation

## 2017-09-13 DIAGNOSIS — S022XXA Fracture of nasal bones, initial encounter for closed fracture: Secondary | ICD-10-CM | POA: Diagnosis not present

## 2017-09-13 DIAGNOSIS — W19XXXA Unspecified fall, initial encounter: Secondary | ICD-10-CM

## 2017-09-13 DIAGNOSIS — S80212A Abrasion, left knee, initial encounter: Secondary | ICD-10-CM | POA: Diagnosis not present

## 2017-09-13 DIAGNOSIS — Y92009 Unspecified place in unspecified non-institutional (private) residence as the place of occurrence of the external cause: Secondary | ICD-10-CM

## 2017-09-13 DIAGNOSIS — R079 Chest pain, unspecified: Secondary | ICD-10-CM | POA: Insufficient documentation

## 2017-09-13 DIAGNOSIS — Y92003 Bedroom of unspecified non-institutional (private) residence as the place of occurrence of the external cause: Secondary | ICD-10-CM | POA: Insufficient documentation

## 2017-09-13 DIAGNOSIS — Y939 Activity, unspecified: Secondary | ICD-10-CM | POA: Insufficient documentation

## 2017-09-13 DIAGNOSIS — E119 Type 2 diabetes mellitus without complications: Secondary | ICD-10-CM | POA: Insufficient documentation

## 2017-09-13 DIAGNOSIS — S0181XA Laceration without foreign body of other part of head, initial encounter: Secondary | ICD-10-CM | POA: Insufficient documentation

## 2017-09-13 DIAGNOSIS — S8011XA Contusion of right lower leg, initial encounter: Secondary | ICD-10-CM | POA: Insufficient documentation

## 2017-09-13 DIAGNOSIS — Y998 Other external cause status: Secondary | ICD-10-CM | POA: Insufficient documentation

## 2017-09-13 DIAGNOSIS — W1840XA Slipping, tripping and stumbling without falling, unspecified, initial encounter: Secondary | ICD-10-CM | POA: Insufficient documentation

## 2017-09-13 MED ORDER — LIDOCAINE-EPINEPHRINE (PF) 2 %-1:200000 IJ SOLN
10.0000 mL | Freq: Once | INTRAMUSCULAR | Status: AC
  Administered 2017-09-14: 10 mL
  Filled 2017-09-13: qty 20

## 2017-09-13 MED ORDER — POVIDONE-IODINE 10 % EX SOLN
CUTANEOUS | Status: AC
  Filled 2017-09-13: qty 30

## 2017-09-13 NOTE — ED Notes (Signed)
Pt has laceration on forehead. Pt states she has some chest discomfort form the fall from trying to "catch" herself before the fall. Pt also states her right shoulder is sore. Pt denies leg or hip pain.  Pt states she fell because she tripped. When pt fell she hit her head on her heater.

## 2017-09-13 NOTE — ED Notes (Signed)
EDP at bedside  

## 2017-09-13 NOTE — ED Triage Notes (Signed)
Pt c/o face/head pain after tripping over heater. She has a laceration to the forehead. Pt denies and loc.

## 2017-09-14 DIAGNOSIS — S0181XA Laceration without foreign body of other part of head, initial encounter: Secondary | ICD-10-CM | POA: Diagnosis not present

## 2017-09-14 LAB — I-STAT TROPONIN, ED
TROPONIN I, POC: 0 ng/mL (ref 0.00–0.08)
Troponin i, poc: 0 ng/mL (ref 0.00–0.08)

## 2017-09-14 NOTE — ED Provider Notes (Signed)
LACERATION REPAIR  Patient is a 71 year old female who presents to the emergency department following a fall.  Patient sustained a laceration to the forehead.  I discussed the repair with the patient and her family in terms of which they understand.  The patient gives permission for repair of the laceration.  Patient identified by armband.  Procedural timeout taken.  The wound was painted with Betadine.  It was then infiltrated with 2% lidocaine with epinephrine.  After adequate anesthetic achieved, the patient was draped in the usual sterile fashion.  The patient was again painted with Betadine.  The wound was repaired with 4 interrupted sutures of 6-0 nylon.  The patient tolerated the procedure without problem.  Bandage was applied.   Ivery QualeBryant, , PA-C 09/14/17 Harvel [id number]    Devoria AlbeKnapp, Iva, MD 09/14/17 [phone]

## 2017-09-14 NOTE — ED Provider Notes (Signed)
[facility] EMERGENCY DEPARTMENT Provider Note   CSN: [id number] Arrival date & time: 09/13/17  2145  Time seen 23:08 PM   History   Chief Complaint Chief Complaint  Patient presents with  . Fall    HPI Jari SportsmanBeulah Milus GlazierM Bolger is a 71 y.o. female.  HPI patient states she has a pin in her left foot and sometimes her right foot does not pick up well.  She states this is been going on for the past 8-9 months and is intermittent.  She denies any pain in her leg.  She states about 9 PM she was getting ready for bed and had forgotten there was an electrical cord across the floor and she tripped and fell hitting her head on a heater.  She states she then went down to her knees.  She denies loss of consciousness.  She states she does have some achiness in her head and she has some pain especially around her right cheek.  She also states her anterior chest is painful and describes it as aching.  She denies hitting her chest.  She states the pain is been there constantly since she fell.  She denies shortness of breath, diaphoresis, or acid fluid in the throat although she states sometimes the chest is a burning sensation.  She denies any numbness or tingling or extremities or blurred vision tonight.  She states her knees are sore but are not extremely uncomfortable.  She denies any other injury.  She states her tetanus is up-to-date.  PCP Oval Linseyondiego, Richard, MD   Past Medical History:  Diagnosis Date  . Anemia   . Back spasm   . Degenerative arthritis   . Diabetes (HCC)    diet controlled  . Dizziness and giddiness 03/07/2013  . Dyslipidemia   . Gastroesophageal reflux disease   . Hand cramps   . Headache(784.0) 03/07/2013  . History of carpal tunnel syndrome    Bilateral  . Hypertension   . Leg cramps   . Memory deficit 02/25/2014  . Seizures (HCC)    as child- unknown etiology-no meds    Patient Active Problem List   Diagnosis Date Noted  . Tremor 09/11/2014  . Unspecified constipation  04/30/2014  . Rotator cuff (capsule) sprain 04/13/2014  . Rotator cuff tear, left 02/26/2014  . Memory deficit 02/25/2014  . Dizziness and giddiness 03/07/2013  . Headache 03/07/2013    Past Surgical History:  Procedure Laterality Date  . ANKLE SURGERY Left    History of ankle fracture  . BACK SURGERY    . COLONOSCOPY N/A 05/04/2014   Procedure: COLONOSCOPY;  Surgeon: West BaliSandi L Fields, MD;  Location: [facility];  Service: Endoscopy;  Laterality: N/A;  11:30  . HAND SURGERY     Carpal tunnel syndrome, trigger fingers bilaterally  . KNEE SURGERY Left    Pin, history of fracture  . SHOULDER ACROMIOPLASTY Left 06/10/2014   Procedure: SHOULDER ACROMIOPLASTY;  Surgeon: Vickki HearingStanley E Harrison, MD;  Location: [facility] ORS;  Service: Orthopedics;  Laterality: Left;  . SHOULDER OPEN ROTATOR CUFF REPAIR Left 04/08/2014   Dr. Romeo AppleHarrison  . SHOULDER OPEN ROTATOR CUFF REPAIR Left 06/10/2014   Procedure: REVISION ROTATOR CUFF REPAIR SHOULDER OPEN WITH GRAFT;  Surgeon: Vickki HearingStanley E Harrison, MD;  Location: [facility] ORS;  Service: Orthopedics;  Laterality: Left;    OB History    No data available       Home Medications    Prior to Admission medications   Medication Sig Start Date End  Date Taking? Authorizing Provider  cyclobenzaprine (FLEXERIL) 10 MG tablet Take 1 tablet (10 mg total) 2 (two) times daily as needed by mouth for muscle spasms. 08/18/17   Donnetta Hutching, MD  ferrous sulfate 325 (65 FE) MG tablet Take 325 mg by mouth daily with breakfast.    [provider]  lisinopril-hydrochlorothiazide (PRINZIDE,ZESTORETIC) 20-12.5 MG tablet Take 1 tablet by mouth daily.    [provider]  metFORMIN (GLUCOPHAGE) 500 MG tablet Take 500 mg by mouth 2 (two) times daily. 05/04/15   [provider]  Multiple Vitamin (MULTIVITAMIN) tablet Take 1 tablet by mouth daily.    [provider]  naproxen (NAPROSYN) 500 MG tablet Take 500 mg by mouth 2 (two) times daily. 12/23/15   [provider]  Omega-3 Fatty Acids (FISH OIL) 1000 MG CAPS Take 1 capsule by mouth daily.    [provider]  oxyCODONE (OXY IR/ROXICODONE) 5 MG immediate release tablet Take 10 mg 4 (four) times daily by mouth.  02/06/17   [provider]  oxyCODONE-acetaminophen (PERCOCET) 5-325 MG tablet Take 1 tablet every 4 (four) hours as needed by mouth. 08/18/17   Donnetta Hutching, MD  pravastatin (PRAVACHOL) 40 MG tablet Take 40 mg by mouth daily. 02/11/14   [provider]  psyllium (REGULOID) 0.52 g capsule Take 0.52 g daily by mouth.    [provider]  ranitidine (ZANTAC) 150 MG capsule Take 150 mg by mouth 2 (two) times daily.    [provider]  venlafaxine XR (EFFEXOR-XR) 37.5 MG 24 hr capsule Take 2 capsules (75 mg total) by mouth daily with breakfast. Patient not taking: Reported on 08/18/2017 05/24/17   Butch Penny, NP  vitamin C (ASCORBIC ACID) 500 MG tablet Take 500 mg by mouth daily.    [provider]  zonisamide (ZONEGRAN) 25 MG capsule TAKE 1 CAPSULE (25 MG TOTAL) BY MOUTH DAILY. 09/10/17   York Spaniel, MD    Family History Family History  Problem Relation Age of Onset  . Heart attack Father   . Colon cancer Neg Hx     Social History Social History   Tobacco Use  . Smoking status: Never Smoker  . Smokeless tobacco: Never Used  Substance Use Topics  . Alcohol use: No  . Drug use: No  lives alone   Allergies   Patient has no known allergies.   Review of Systems Review of Systems  All other systems reviewed and are negative.    Physical Exam Updated Vital Signs BP 139/69   Pulse 83   Temp 98.2 F (36.8 C)   Resp 20   Ht 5' 7.5" (1.715 m)   Wt 74.8 kg (165 lb)   SpO2 100%   BMI 25.46 kg/m   Vital signs normal    Physical Exam  Constitutional: She is oriented to person, place, and time. She appears well-developed and well-nourished.  Non-toxic appearance. She does not appear ill. No distress.  HENT:    Head: Normocephalic.    Right Ear: External ear normal.  Left Ear: External ear normal.  Nose: Nose normal. No mucosal edema or rhinorrhea.  Mouth/Throat: Oropharynx is clear and moist and mucous membranes are normal. No dental abscesses or uvula swelling.  Patient has a linear 2 cm laceration in her mid forehead close to the hairline that is through the dermal layer.  It is not actively bleeding.  She has some tenderness of her right face and a very superficial abrasion on the  bridge of her nose with tenderness of her proximal nasal spine.  Eyes: Conjunctivae and EOM are normal. Pupils are equal, round, and reactive to light.  Neck: Normal range of motion and full passive range of motion without pain. Neck supple.    Nontender cervical spine, has some tenderness over the right trapezius muscle  Cardiovascular: Normal rate, regular rhythm and normal heart sounds. Exam reveals no gallop and no friction rub.  No murmur heard. Pulmonary/Chest: Effort normal and breath sounds normal. No respiratory distress. She has no wheezes. She has no rhonchi. She has no rales. She exhibits no tenderness and no crepitus.  She has some mild tenderness of her right mid chest without crepitance.    Abdominal: Soft. Normal appearance and bowel sounds are normal. She exhibits no distension. There is no tenderness. There is no rebound and no guarding.  Musculoskeletal: Normal range of motion. She exhibits no edema or tenderness.  Moves all extremities well.  She has a superficial epidermal abrasion on her anterior left knee, she has some bruising of her right lower leg proximally and anteriorly located.  Neurological: She is alert and oriented to person, place, and time. She has normal strength. No cranial nerve deficit.  Skin: Skin is warm, dry and intact. No rash noted. No erythema. No pallor.  Psychiatric: She has a normal mood and affect. Her speech is normal and behavior is normal. Her mood appears not  anxious.  Nursing note and vitals reviewed.    ED Treatments / Results  Labs (all labs ordered are listed, but only abnormal results are displayed)  Results for orders placed or performed during the hospital encounter of 09/13/17  I-stat troponin, ED  Result Value Ref Range   Troponin i, poc 0.00 0.00 - 0.08 ng/mL   Comment 3          I-stat troponin, ED  Result Value Ref Range   Troponin i, poc 0.00 0.00 - 0.08 ng/mL   Comment 3           Laboratory interpretation all normal    EKG  EKG Interpretation  Date/Time:  Thursday September 13 2017 23:52:09 EST Ventricular Rate:  76 PR Interval:    QRS Duration: 87 QT Interval:  403 QTC Calculation: 454 R Axis:   23 Text Interpretation:  Sinus rhythm Abnormal R-wave progression, early transition No significant change since last tracing 02 Apr 2014 Confirmed by Devoria Albe ([id number]) on 09/14/2017 12:35:58 AM       Radiology Dg Chest 2 View  Result Date: 09/14/2017 CLINICAL DATA:  71 year old female with fall and chest pain. EXAM: CHEST  2 VIEW COMPARISON:  Right rib radiograph dated 04/17/2017 FINDINGS: The lungs are clear. There is no pleural effusion or pneumothorax. The cardiac silhouette is within normal limits. No acute osseous pathology. IMPRESSION: No active cardiopulmonary disease. Electronically Signed   By: Elgie Collard M.D.   On: 09/14/2017 01:08   Ct Head Wo Contrast  Ct Maxillofacial Wo Cm  Result Date: 09/14/2017 CLINICAL DATA:  Status post fall with laceration to the forehead. EXAM: CT HEAD WITHOUT CONTRAST CT MAXILLOFACIAL WITHOUT CONTRAST TECHNIQUE: Multidetector CT imaging of the head and maxillofacial structures were performed using the standard protocol without intravenous contrast. Multiplanar CT image reconstructions of the maxillofacial structures were also generated. COMPARISON:  12/27/2015 FINDINGS: CT HEAD FINDINGS Brain: No evidence of acute infarction, hemorrhage, hydrocephalus, extra-axial collection  or mass lesion/mass effect. Vascular: No hyperdense vessel or unexpected calcification. Skull: Normal. Negative for fracture  or focal lesion. CT MAXILLOFACIAL FINDINGS Osseous: Comminuted mildly depressed right nasal bone fracture. Orbits: Negative. No traumatic or inflammatory finding. Sinuses: Clear. Soft tissues: Right forehead scalp laceration. IMPRESSION: No acute intracranial abnormality. Comminuted mildly depressed right nasal bone fracture. Right forehead scalp laceration. Electronically Signed   By: Ted Mcalpineobrinka  Dimitrova M.D.   On: 09/14/2017 01:07  \  Dg Cervical Spine 2-3 Views  Result Date: 08/18/2017 CLINICAL DATA:  CervicalgiaIMPRESSION: Osteoarthritic change at several levels, most severe at C5-6. No fracture or spondylolisthesis. Electronically Signed   By: Bretta BangWilliam  Woodruff III M.D.   On: 08/18/2017 11:59   Procedures Procedures (including critical care time)  Medications Ordered in ED Medications  povidone-iodine (BETADINE) 10 % external solution (not administered)  lidocaine-EPINEPHrine (XYLOCAINE W/EPI) 2 %-1:200000 (PF) injection 10 mL (10 mLs Infiltration Given 09/14/17 0113)     Initial Impression / Assessment and Plan / ED Course  I have reviewed the triage vital signs and the nursing notes.  Pertinent labs & imaging results that were available during my care of the patient were reviewed by me and considered in my medical decision making (see chart for details).     Patient was seen recently in the ED for pain in her neck radiating to the left.  EKG and troponins were done to evaluate her chest pain.  Although she states she did not hit her chest I suspect she may have.  She had trauma to the right side of her face and it would make sense she would have hit her right chest also when she fell.  Patient had sutures placed by my PA, Ivery QualeHobson Bryant.  We discussed that she did have a nasal fracture.  She states she thinks she did see an ears nose and throat specialist in  the past but cannot recall who it was but [facility].  She was given their number or she can follow-up with Dr. Suszanne Connerseoh.   Final Clinical Impressions(s) / ED Diagnoses   Final diagnoses:  Fall in home, initial encounter  Trips over objects  Laceration of forehead, initial encounter  Closed fracture of nasal bone, initial encounter  Contusion of face, initial encounter  Contusion of right chest wall, initial encounter  Abrasion, knee, left, initial encounter  Contusion of right lower leg, initial encounter    ED Discharge Orders    None    OTC acetaminophen  Plan discharge  Devoria Albe , MD, Concha PyoFACEP     , , MD 09/14/17 [phone]

## 2017-09-14 NOTE — Discharge Instructions (Signed)
Use ice on the injured areas for comfort. You can take acetaminophen 650 mg every 6 hrs for pain as needed. Keep the laceration clean and dry. Do use antibiotic ointment on the wound.  Recheck for any problems listed on the head injury sheet, if the laceration gets infected (increased redness, swelling, drainage, pain), you struggle to breathe or your pains get worse. The sutures need to be removed in 3-5 days.  Call to get an appointment to follow up for your nasal fracture (broken nose).

## 2017-09-14 NOTE — ED Notes (Signed)
Suture cart at room door. Suture tray on counter. Medication and betadine on counter.

## 2017-09-14 NOTE — ED Notes (Signed)
Gave EKG to Dr. Knapp  

## 2017-09-20 DIAGNOSIS — S022XXD Fracture of nasal bones, subsequent encounter for fracture with routine healing: Secondary | ICD-10-CM | POA: Insufficient documentation

## 2017-09-20 DIAGNOSIS — S0180XD Unspecified open wound of other part of head, subsequent encounter: Secondary | ICD-10-CM | POA: Insufficient documentation

## 2017-10-19 ENCOUNTER — Other Ambulatory Visit: Payer: Self-pay | Admitting: Neurosurgery

## 2017-10-25 ENCOUNTER — Encounter (HOSPITAL_COMMUNITY): Payer: Self-pay

## 2017-10-25 ENCOUNTER — Encounter (HOSPITAL_COMMUNITY)
Admission: RE | Admit: 2017-10-25 | Discharge: 2017-10-25 | Disposition: A | Discharge status: Home / Self Care | Payer: Medicare Other | Source: Ambulatory Visit | Attending: Neurosurgery | Admitting: Neurosurgery

## 2017-10-25 DIAGNOSIS — Z01818 Encounter for other preprocedural examination: Secondary | ICD-10-CM | POA: Insufficient documentation

## 2017-10-25 DIAGNOSIS — E785 Hyperlipidemia, unspecified: Secondary | ICD-10-CM | POA: Insufficient documentation

## 2017-10-25 DIAGNOSIS — E119 Type 2 diabetes mellitus without complications: Secondary | ICD-10-CM | POA: Insufficient documentation

## 2017-10-25 DIAGNOSIS — I10 Essential (primary) hypertension: Secondary | ICD-10-CM | POA: Diagnosis not present

## 2017-10-25 DIAGNOSIS — K219 Gastro-esophageal reflux disease without esophagitis: Secondary | ICD-10-CM | POA: Insufficient documentation

## 2017-10-25 DIAGNOSIS — M4186 Other forms of scoliosis, lumbar region: Secondary | ICD-10-CM | POA: Diagnosis not present

## 2017-10-25 DIAGNOSIS — M5136 Other intervertebral disc degeneration, lumbar region: Secondary | ICD-10-CM | POA: Diagnosis not present

## 2017-10-25 LAB — BASIC METABOLIC PANEL
ANION GAP: 9 (ref 5–15)
BUN: 14 mg/dL (ref 6–20)
CHLORIDE: 107 mmol/L (ref 101–111)
CO2: 23 mmol/L (ref 22–32)
Calcium: 9.5 mg/dL (ref 8.9–10.3)
Creatinine, Ser: 1.63 mg/dL — ABNORMAL HIGH (ref 0.44–1.00)
GFR calc Af Amer: 36 mL/min — ABNORMAL LOW (ref >60)
GFR, EST NON AFRICAN AMERICAN: 31 mL/min — AB (ref >60)
GLUCOSE: 97 mg/dL (ref 65–99)
POTASSIUM: 4.1 mmol/L (ref 3.5–5.1)
SODIUM: 139 mmol/L (ref 135–145)

## 2017-10-25 LAB — CBC
HCT: 34.3 % — ABNORMAL LOW (ref 36.0–46.0)
HEMOGLOBIN: 11.1 g/dL — AB (ref 12.0–15.0)
MCH: 25.8 pg — AB (ref 26.0–34.0)
MCHC: 32.4 g/dL (ref 30.0–36.0)
MCV: 79.6 fL (ref 78.0–100.0)
PLATELETS: 281 10*3/uL (ref 150–400)
RBC: 4.31 MIL/uL (ref 3.87–5.11)
RDW: 13.9 % (ref 11.5–15.5)
WBC: 8.1 10*3/uL (ref 4.0–10.5)

## 2017-10-25 LAB — SURGICAL PCR SCREEN
MRSA, PCR: NEGATIVE
Staphylococcus aureus: NEGATIVE

## 2017-10-25 LAB — HEMOGLOBIN A1C
HEMOGLOBIN A1C: 5.8 % — AB (ref 4.8–5.6)
MEAN PLASMA GLUCOSE: 119.76 mg/dL

## 2017-10-25 LAB — GLUCOSE, CAPILLARY: Glucose-Capillary: 82 mg/dL (ref 65–99)

## 2017-10-25 MED ORDER — CHLORHEXIDINE GLUCONATE CLOTH 2 % EX PADS: 6.0000 | MEDICATED_PAD | Freq: Once | CUTANEOUS | Status: DC

## 2017-10-25 NOTE — Pre-Procedure Instructions (Signed)
    [name]  10/25/2017      [id number] - [place], [place] - [address] [place] [address] Phone: [phone] Fax: [phone]  [id number] [place] - [address] [facility] [id number] [place] [address] Phone: [phone] Fax: [phone]    Your procedure is scheduled on 10/29/17.  [facility] at 11 A.M.  Call this number if you have problems the morning of surgery:  [phone]   Remember:  Do not eat food or drink liquids after midnight.  Take these medicines the morning of surgery with A SIP OF WATER --tylenol,oxycodone   Do not wear jewelry, make-up or nail polish.  Do not wear lotions, powders, or perfumes, or deodorant.  Do not shave 48 hours prior to surgery.  Men may shave face and neck.  Do not bring valuables to the hospital.  [facility] is not responsible for any belongings or valuables.  Contacts, dentures or bridgework may not be worn into surgery.  Leave your suitcase in the car.  After surgery it may be brought to your room.  For patients admitted to the hospital, discharge time will be determined by your treatment team.  Patients discharged the day of surgery will not be allowed to drive home.   Name and phone number of your driver:    Special instructions:  Do not take any aspirin,anti-inflammatories,vitamins,or herbal supplements 5-7 days prior to surgery.  Please read over the following fact sheets that you were given. MRSA Information

## 2017-10-26 NOTE — Progress Notes (Signed)
Anesthesia Chart Review:  Pt is a 72 year old female scheduled for L3-4, L4-5 lumbar laminectomy and foraminotomy on 10/29/2017 with Tressie StalkerJeffrey Jenkins, MD  PMH includes:  HTN, DM, hyperlipidemia, anemia, seizures (as a child), GERD. Never smoker. BMI 26. S/p L rotator cuff repair 06/10/14 and 04/08/14.   Medications include: iron, lisinopril-hctz, metformin, pravastatin, zantac  BP 124/70   Pulse 74   Temp 36.8 C   Resp 20   Ht 5\' 7"  (1.702 m)   Wt 166 lb (75.3 kg)   SpO2 100%   BMI 26.00 kg/m   Preoperative labs reviewed.   - HbA1c 5.8, glucose 97 - Cr 1.63, BUN 14. Cr ranged 1.4 to 1.58 in 2015  CXR 09/14/17: No active cardiopulmonary disease.  EKG 09/13/17: Sinus rhythm. Abnormal R-wave progression, early transition  If no changes, I anticipate pt can proceed with surgery as scheduled.   Rica Mastngela , FNP-BC [facility]Anesthesiology Phone: [phone] 10/26/2017 9:35 AM

## 2017-10-29 ENCOUNTER — Ambulatory Visit (HOSPITAL_COMMUNITY): Payer: Medicare Other | Admitting: Certified Registered"

## 2017-10-29 ENCOUNTER — Ambulatory Visit (HOSPITAL_COMMUNITY): Payer: Medicare Other | Admitting: Emergency Medicine

## 2017-10-29 ENCOUNTER — Ambulatory Visit (HOSPITAL_COMMUNITY)
Admission: RE | Disposition: A | Discharge status: Home / Self Care | Payer: Self-pay | Source: Ambulatory Visit | Attending: Neurosurgery

## 2017-10-29 ENCOUNTER — Encounter (HOSPITAL_COMMUNITY): Payer: Self-pay | Admitting: Urology

## 2017-10-29 ENCOUNTER — Ambulatory Visit (HOSPITAL_COMMUNITY): Payer: Medicare Other

## 2017-10-29 ENCOUNTER — Ambulatory Visit (HOSPITAL_COMMUNITY)
Admission: RE | Admit: 2017-10-29 | Discharge: 2017-10-30 | Disposition: A | Discharge status: Home / Self Care | Payer: Medicare Other | Source: Ambulatory Visit | Attending: Neurosurgery | Admitting: Neurosurgery

## 2017-10-29 DIAGNOSIS — R2681 Unsteadiness on feet: Secondary | ICD-10-CM | POA: Diagnosis not present

## 2017-10-29 DIAGNOSIS — K219 Gastro-esophageal reflux disease without esophagitis: Secondary | ICD-10-CM | POA: Diagnosis not present

## 2017-10-29 DIAGNOSIS — M4316 Spondylolisthesis, lumbar region: Secondary | ICD-10-CM | POA: Diagnosis not present

## 2017-10-29 DIAGNOSIS — E785 Hyperlipidemia, unspecified: Secondary | ICD-10-CM | POA: Diagnosis not present

## 2017-10-29 DIAGNOSIS — M419 Scoliosis, unspecified: Secondary | ICD-10-CM | POA: Diagnosis not present

## 2017-10-29 DIAGNOSIS — M48062 Spinal stenosis, lumbar region with neurogenic claudication: Secondary | ICD-10-CM | POA: Diagnosis present

## 2017-10-29 DIAGNOSIS — M5416 Radiculopathy, lumbar region: Secondary | ICD-10-CM | POA: Insufficient documentation

## 2017-10-29 DIAGNOSIS — Z419 Encounter for procedure for purposes other than remedying health state, unspecified: Secondary | ICD-10-CM

## 2017-10-29 DIAGNOSIS — I10 Essential (primary) hypertension: Secondary | ICD-10-CM | POA: Diagnosis not present

## 2017-10-29 DIAGNOSIS — E119 Type 2 diabetes mellitus without complications: Secondary | ICD-10-CM | POA: Insufficient documentation

## 2017-10-29 DIAGNOSIS — Z7984 Long term (current) use of oral hypoglycemic drugs: Secondary | ICD-10-CM | POA: Diagnosis not present

## 2017-10-29 DIAGNOSIS — Z79899 Other long term (current) drug therapy: Secondary | ICD-10-CM | POA: Insufficient documentation

## 2017-10-29 HISTORY — PX: LUMBAR LAMINECTOMY/DECOMPRESSION MICRODISCECTOMY: SHX5026

## 2017-10-29 LAB — GLUCOSE, CAPILLARY
GLUCOSE-CAPILLARY: 95 mg/dL (ref 65–99)
GLUCOSE-CAPILLARY: 108 mg/dL — AB (ref 65–99)
Glucose-Capillary: 93 mg/dL (ref 65–99)
Glucose-Capillary: 105 mg/dL — ABNORMAL HIGH (ref 65–99)
Glucose-Capillary: 185 mg/dL — ABNORMAL HIGH (ref 65–99)

## 2017-10-29 SURGERY — LUMBAR LAMINECTOMY/DECOMPRESSION MICRODISCECTOMY 2 LEVELS
Anesthesia: General | Site: Spine Lumbar

## 2017-10-29 MED ORDER — PHENOL 1.4 % MT LIQD: 1.0000 | OROMUCOSAL | Status: DC | PRN

## 2017-10-29 MED ORDER — MIDAZOLAM HCL 2 MG/2ML IJ SOLN
INTRAMUSCULAR | Status: DC | PRN
  Administered 2017-10-29: 1 mg via INTRAVENOUS

## 2017-10-29 MED ORDER — PROPOFOL 10 MG/ML IV BOLUS
INTRAVENOUS | Status: DC | PRN
  Administered 2017-10-29: 100 mg via INTRAVENOUS

## 2017-10-29 MED ORDER — HYDROCHLOROTHIAZIDE 12.5 MG PO CAPS
12.5000 mg | ORAL_CAPSULE | Freq: Every day | ORAL | Status: DC
  Administered 2017-10-30: 12.5 mg via ORAL
  Filled 2017-10-29: qty 1

## 2017-10-29 MED ORDER — MORPHINE SULFATE (PF) 4 MG/ML IV SOLN: 4.0000 mg | INTRAVENOUS | Status: DC | PRN

## 2017-10-29 MED ORDER — PHENYLEPHRINE 40 MCG/ML (10ML) SYRINGE FOR IV PUSH (FOR BLOOD PRESSURE SUPPORT)
PREFILLED_SYRINGE | INTRAVENOUS | Status: AC
  Filled 2017-10-29: qty 10

## 2017-10-29 MED ORDER — FENTANYL CITRATE (PF) 250 MCG/5ML IJ SOLN
INTRAMUSCULAR | Status: AC
  Filled 2017-10-29: qty 5

## 2017-10-29 MED ORDER — ACETAMINOPHEN 160 MG/5ML PO SOLN: 325.0000 mg | ORAL | Status: DC | PRN

## 2017-10-29 MED ORDER — ONDANSETRON HCL 4 MG/2ML IJ SOLN
INTRAMUSCULAR | Status: DC | PRN
  Administered 2017-10-29: 4 mg via INTRAVENOUS

## 2017-10-29 MED ORDER — BACITRACIN ZINC 500 UNIT/GM EX OINT
TOPICAL_OINTMENT | CUTANEOUS | Status: DC | PRN
  Administered 2017-10-29: 1 via TOPICAL

## 2017-10-29 MED ORDER — PROPOFOL 10 MG/ML IV BOLUS
INTRAVENOUS | Status: AC
  Filled 2017-10-29: qty 20

## 2017-10-29 MED ORDER — FERROUS GLUCONATE 324 (38 FE) MG PO TABS
324.0000 mg | ORAL_TABLET | Freq: Two times a day (BID) | ORAL | Status: DC
  Administered 2017-10-30: 324 mg via ORAL
  Filled 2017-10-29 (×2): qty 1

## 2017-10-29 MED ORDER — PRAVASTATIN SODIUM 40 MG PO TABS
40.0000 mg | ORAL_TABLET | Freq: Every day | ORAL | Status: DC
  Administered 2017-10-29: 40 mg via ORAL
  Filled 2017-10-29: qty 1

## 2017-10-29 MED ORDER — 0.9 % SODIUM CHLORIDE (POUR BTL) OPTIME
TOPICAL | Status: DC | PRN
  Administered 2017-10-29: 1000 mL

## 2017-10-29 MED ORDER — INSULIN ASPART 100 UNIT/ML ~~LOC~~ SOLN: 0.0000 [IU] | Freq: Three times a day (TID) | SUBCUTANEOUS | Status: DC

## 2017-10-29 MED ORDER — EPHEDRINE 5 MG/ML INJ
INTRAVENOUS | Status: AC
  Filled 2017-10-29: qty 10

## 2017-10-29 MED ORDER — THROMBIN (RECOMBINANT) 5000 UNITS EX SOLR
CUTANEOUS | Status: DC | PRN
  Administered 2017-10-29 (×2): 5000 [IU] via TOPICAL

## 2017-10-29 MED ORDER — OXYCODONE HCL 5 MG/5ML PO SOLN: 5.0000 mg | Freq: Once | ORAL | Status: AC | PRN

## 2017-10-29 MED ORDER — FAMOTIDINE 20 MG PO TABS: 20.0000 mg | ORAL_TABLET | Freq: Two times a day (BID) | ORAL | Status: DC

## 2017-10-29 MED ORDER — FENTANYL CITRATE (PF) 100 MCG/2ML IJ SOLN
INTRAMUSCULAR | Status: AC
  Filled 2017-10-29: qty 2

## 2017-10-29 MED ORDER — OXYCODONE HCL 5 MG PO TABS: 5.0000 mg | ORAL_TABLET | Freq: Once | ORAL | Status: DC | PRN

## 2017-10-29 MED ORDER — DEXAMETHASONE SODIUM PHOSPHATE 4 MG/ML IJ SOLN
INTRAMUSCULAR | Status: DC | PRN
  Administered 2017-10-29: 4 mg via INTRAVENOUS

## 2017-10-29 MED ORDER — GELATIN ABSORBABLE MT POWD
OROMUCOSAL | Status: DC | PRN
  Administered 2017-10-29: 15:00:00 via TOPICAL

## 2017-10-29 MED ORDER — PHENYLEPHRINE HCL 10 MG/ML IJ SOLN
INTRAVENOUS | Status: DC | PRN
  Administered 2017-10-29: 50 ug/min via INTRAVENOUS

## 2017-10-29 MED ORDER — ROCURONIUM BROMIDE 100 MG/10ML IV SOLN
INTRAVENOUS | Status: DC | PRN
  Administered 2017-10-29: 50 mg via INTRAVENOUS
  Administered 2017-10-29: 10 mg via INTRAVENOUS

## 2017-10-29 MED ORDER — ONDANSETRON HCL 4 MG PO TABS: 4.0000 mg | ORAL_TABLET | Freq: Four times a day (QID) | ORAL | Status: DC | PRN

## 2017-10-29 MED ORDER — OXYCODONE HCL 5 MG PO TABS
5.0000 mg | ORAL_TABLET | ORAL | Status: DC | PRN
  Administered 2017-10-30 (×2): 5 mg via ORAL
  Filled 2017-10-29 (×2): qty 1

## 2017-10-29 MED ORDER — ZONISAMIDE 25 MG PO CAPS
25.0000 mg | ORAL_CAPSULE | Freq: Every day | ORAL | Status: DC
  Administered 2017-10-29 – 2017-10-30 (×2): 25 mg via ORAL
  Filled 2017-10-29 (×2): qty 1

## 2017-10-29 MED ORDER — CEFAZOLIN SODIUM-DEXTROSE 2-4 GM/100ML-% IV SOLN
INTRAVENOUS | Status: AC
  Filled 2017-10-29: qty 100

## 2017-10-29 MED ORDER — BISACODYL 10 MG RE SUPP: 10.0000 mg | Freq: Every day | RECTAL | Status: DC | PRN

## 2017-10-29 MED ORDER — LIDOCAINE 2% (20 MG/ML) 5 ML SYRINGE
INTRAMUSCULAR | Status: DC | PRN
  Administered 2017-10-29: 100 mg via INTRAVENOUS

## 2017-10-29 MED ORDER — BUPIVACAINE-EPINEPHRINE (PF) 0.5% -1:200000 IJ SOLN
INTRAMUSCULAR | Status: DC | PRN
  Administered 2017-10-29: 10 mL

## 2017-10-29 MED ORDER — SUGAMMADEX SODIUM 200 MG/2ML IV SOLN
INTRAVENOUS | Status: AC
  Filled 2017-10-29: qty 2

## 2017-10-29 MED ORDER — OXYCODONE HCL 5 MG/5ML PO SOLN: 5.0000 mg | Freq: Once | ORAL | Status: DC | PRN

## 2017-10-29 MED ORDER — CEFAZOLIN SODIUM-DEXTROSE 2-4 GM/100ML-% IV SOLN
2.0000 g | Freq: Three times a day (TID) | INTRAVENOUS | Status: AC
  Administered 2017-10-29 – 2017-10-30 (×2): 2 g via INTRAVENOUS
  Filled 2017-10-29 (×2): qty 100

## 2017-10-29 MED ORDER — METFORMIN HCL 500 MG PO TABS
500.0000 mg | ORAL_TABLET | Freq: Two times a day (BID) | ORAL | Status: DC
  Administered 2017-10-29 – 2017-10-30 (×2): 500 mg via ORAL
  Filled 2017-10-29 (×2): qty 1

## 2017-10-29 MED ORDER — OXYCODONE HCL 5 MG PO TABS
5.0000 mg | ORAL_TABLET | Freq: Once | ORAL | Status: AC | PRN
  Administered 2017-10-29: 5 mg via ORAL

## 2017-10-29 MED ORDER — SODIUM CHLORIDE 0.9% FLUSH: 3.0000 mL | Freq: Two times a day (BID) | INTRAVENOUS | Status: DC

## 2017-10-29 MED ORDER — DEXAMETHASONE SODIUM PHOSPHATE 10 MG/ML IJ SOLN
INTRAMUSCULAR | Status: AC
  Filled 2017-10-29: qty 1

## 2017-10-29 MED ORDER — THROMBIN 5000 UNITS EX SOLR
CUTANEOUS | Status: AC
  Filled 2017-10-29: qty 5000

## 2017-10-29 MED ORDER — LIDOCAINE 2% (20 MG/ML) 5 ML SYRINGE
INTRAMUSCULAR | Status: AC
  Filled 2017-10-29: qty 5

## 2017-10-29 MED ORDER — FENTANYL CITRATE (PF) 100 MCG/2ML IJ SOLN
INTRAMUSCULAR | Status: AC
  Administered 2017-10-29: 50 ug via INTRAVENOUS
  Filled 2017-10-29: qty 2

## 2017-10-29 MED ORDER — ACETAMINOPHEN 325 MG PO TABS: 325.0000 mg | ORAL_TABLET | ORAL | Status: DC | PRN

## 2017-10-29 MED ORDER — LACTATED RINGERS IV SOLN
INTRAVENOUS | Status: DC | PRN
  Administered 2017-10-29 (×2): via INTRAVENOUS

## 2017-10-29 MED ORDER — OXYCODONE HCL 5 MG PO TABS
10.0000 mg | ORAL_TABLET | ORAL | Status: DC | PRN
  Administered 2017-10-29 (×2): 10 mg via ORAL
  Filled 2017-10-29 (×2): qty 2

## 2017-10-29 MED ORDER — PHENYLEPHRINE 40 MCG/ML (10ML) SYRINGE FOR IV PUSH (FOR BLOOD PRESSURE SUPPORT)
PREFILLED_SYRINGE | INTRAVENOUS | Status: DC | PRN
  Administered 2017-10-29 (×2): 80 ug via INTRAVENOUS

## 2017-10-29 MED ORDER — CYCLOBENZAPRINE HCL 10 MG PO TABS
ORAL_TABLET | ORAL | Status: AC
  Filled 2017-10-29: qty 1

## 2017-10-29 MED ORDER — BACITRACIN ZINC 500 UNIT/GM EX OINT
TOPICAL_OINTMENT | CUTANEOUS | Status: AC
  Filled 2017-10-29: qty 28.35

## 2017-10-29 MED ORDER — SODIUM CHLORIDE 0.9 % IV SOLN: 250.0000 mL | INTRAVENOUS | Status: DC

## 2017-10-29 MED ORDER — FAMOTIDINE 20 MG PO TABS
20.0000 mg | ORAL_TABLET | Freq: Every day | ORAL | Status: DC
  Administered 2017-10-30: 20 mg via ORAL
  Filled 2017-10-29: qty 1

## 2017-10-29 MED ORDER — HEMOSTATIC AGENTS (NO CHARGE) OPTIME
TOPICAL | Status: DC | PRN
  Administered 2017-10-29: 1

## 2017-10-29 MED ORDER — BUPIVACAINE-EPINEPHRINE (PF) 0.5% -1:200000 IJ SOLN
INTRAMUSCULAR | Status: AC
  Filled 2017-10-29: qty 30

## 2017-10-29 MED ORDER — SODIUM CHLORIDE 0.9 % IR SOLN
Status: DC | PRN
  Administered 2017-10-29: 500 mL

## 2017-10-29 MED ORDER — LISINOPRIL 20 MG PO TABS
20.0000 mg | ORAL_TABLET | Freq: Every day | ORAL | Status: DC
  Administered 2017-10-30: 20 mg via ORAL
  Filled 2017-10-29: qty 1

## 2017-10-29 MED ORDER — OXYCODONE HCL 5 MG PO TABS
ORAL_TABLET | ORAL | Status: AC
  Administered 2017-10-29: 5 mg via ORAL
  Filled 2017-10-29: qty 1

## 2017-10-29 MED ORDER — FERROUS GLUCONATE 240 (27 FE) MG PO TABS: 27.0000 mg | ORAL_TABLET | Freq: Two times a day (BID) | ORAL | Status: DC

## 2017-10-29 MED ORDER — THROMBIN 5000 UNITS EX SOLR
CUTANEOUS | Status: AC
  Filled 2017-10-29: qty 10000

## 2017-10-29 MED ORDER — FENTANYL CITRATE (PF) 100 MCG/2ML IJ SOLN
INTRAMUSCULAR | Status: DC | PRN
  Administered 2017-10-29: 100 ug via INTRAVENOUS
  Administered 2017-10-29 (×2): 50 ug via INTRAVENOUS
  Administered 2017-10-29: 100 ug via INTRAVENOUS
  Administered 2017-10-29: 50 ug via INTRAVENOUS

## 2017-10-29 MED ORDER — ARTIFICIAL TEARS OPHTHALMIC OINT
TOPICAL_OINTMENT | OPHTHALMIC | Status: DC | PRN
  Administered 2017-10-29: 1 via OPHTHALMIC

## 2017-10-29 MED ORDER — FENTANYL CITRATE (PF) 100 MCG/2ML IJ SOLN: 25.0000 ug | INTRAMUSCULAR | Status: DC | PRN

## 2017-10-29 MED ORDER — INSULIN ASPART 100 UNIT/ML ~~LOC~~ SOLN: 0.0000 [IU] | SUBCUTANEOUS | Status: DC

## 2017-10-29 MED ORDER — SUGAMMADEX SODIUM 200 MG/2ML IV SOLN
INTRAVENOUS | Status: DC | PRN
  Administered 2017-10-29: 200 mg via INTRAVENOUS

## 2017-10-29 MED ORDER — ZOLPIDEM TARTRATE 5 MG PO TABS: 5.0000 mg | ORAL_TABLET | Freq: Every evening | ORAL | Status: DC | PRN

## 2017-10-29 MED ORDER — CEFAZOLIN SODIUM-DEXTROSE 2-4 GM/100ML-% IV SOLN
2.0000 g | INTRAVENOUS | Status: AC
  Administered 2017-10-29: 2 g via INTRAVENOUS

## 2017-10-29 MED ORDER — PSYLLIUM 95 % PO PACK
1.0000 | PACK | Freq: Two times a day (BID) | ORAL | Status: DC
  Administered 2017-10-29 – 2017-10-30 (×2): 1 via ORAL
  Filled 2017-10-29 (×2): qty 1

## 2017-10-29 MED ORDER — ACETAMINOPHEN 325 MG PO TABS
650.0000 mg | ORAL_TABLET | ORAL | Status: DC | PRN
  Administered 2017-10-30: 650 mg via ORAL
  Filled 2017-10-29: qty 2

## 2017-10-29 MED ORDER — PSYLLIUM 0.52 G PO CAPS: 0.5200 g | ORAL_CAPSULE | Freq: Two times a day (BID) | ORAL | Status: DC

## 2017-10-29 MED ORDER — DOCUSATE SODIUM 100 MG PO CAPS
100.0000 mg | ORAL_CAPSULE | Freq: Two times a day (BID) | ORAL | Status: DC
  Administered 2017-10-29 – 2017-10-30 (×2): 100 mg via ORAL
  Filled 2017-10-29 (×2): qty 1

## 2017-10-29 MED ORDER — ONDANSETRON HCL 4 MG/2ML IJ SOLN: 4.0000 mg | Freq: Four times a day (QID) | INTRAMUSCULAR | Status: DC | PRN

## 2017-10-29 MED ORDER — ONDANSETRON HCL 4 MG/2ML IJ SOLN
INTRAMUSCULAR | Status: AC
  Filled 2017-10-29: qty 2

## 2017-10-29 MED ORDER — ACETAMINOPHEN 650 MG RE SUPP: 650.0000 mg | RECTAL | Status: DC | PRN

## 2017-10-29 MED ORDER — ROCURONIUM BROMIDE 10 MG/ML (PF) SYRINGE
PREFILLED_SYRINGE | INTRAVENOUS | Status: AC
  Filled 2017-10-29: qty 5

## 2017-10-29 MED ORDER — LACTATED RINGERS IV SOLN
INTRAVENOUS | Status: DC
  Administered 2017-10-29: 11:00:00 via INTRAVENOUS

## 2017-10-29 MED ORDER — CYCLOBENZAPRINE HCL 10 MG PO TABS
10.0000 mg | ORAL_TABLET | Freq: Three times a day (TID) | ORAL | Status: DC | PRN
  Administered 2017-10-29: 10 mg via ORAL

## 2017-10-29 MED ORDER — MIDAZOLAM HCL 2 MG/2ML IJ SOLN
INTRAMUSCULAR | Status: AC
  Filled 2017-10-29: qty 2

## 2017-10-29 MED ORDER — MENTHOL 3 MG MT LOZG: 1.0000 | LOZENGE | OROMUCOSAL | Status: DC | PRN

## 2017-10-29 MED ORDER — FENTANYL CITRATE (PF) 100 MCG/2ML IJ SOLN
25.0000 ug | INTRAMUSCULAR | Status: DC | PRN
  Administered 2017-10-29 (×3): 50 ug via INTRAVENOUS

## 2017-10-29 MED ORDER — LISINOPRIL-HYDROCHLOROTHIAZIDE 20-12.5 MG PO TABS: 1.0000 | ORAL_TABLET | Freq: Every day | ORAL | Status: DC

## 2017-10-29 MED ORDER — SODIUM CHLORIDE 0.9% FLUSH: 3.0000 mL | INTRAVENOUS | Status: DC | PRN

## 2017-10-29 SURGICAL SUPPLY — 49 items
BAG DECANTER FOR FLEXI CONT (MISCELLANEOUS) ×2 IMPLANT
BENZOIN TINCTURE PRP APPL 2/3 (GAUZE/BANDAGES/DRESSINGS) ×2 IMPLANT
BLADE CLIPPER SURG (BLADE) IMPLANT
BUR MATCHSTICK NEURO 3.0 LAGG (BURR) ×2 IMPLANT
BUR PRECISION FLUTE 6.0 (BURR) ×4 IMPLANT
CANISTER SUCT 3000ML PPV (MISCELLANEOUS) ×2 IMPLANT
CARTRIDGE OIL MAESTRO DRILL (MISCELLANEOUS) ×1 IMPLANT
DIFFUSER DRILL AIR PNEUMATIC (MISCELLANEOUS) ×2 IMPLANT
DRAPE LAPAROTOMY 100X72X124 (DRAPES) ×2 IMPLANT
DRAPE MICROSCOPE LEICA (MISCELLANEOUS) ×2 IMPLANT
DRAPE POUCH INSTRU U-SHP 10X18 (DRAPES) ×2 IMPLANT
DRAPE SURG 17X23 STRL (DRAPES) ×8 IMPLANT
ELECT BLADE 4.0 EZ CLEAN MEGAD (MISCELLANEOUS) ×2
ELECT REM PT RETURN 9FT ADLT (ELECTROSURGICAL) ×2
ELECTRODE BLDE 4.0 EZ CLN MEGD (MISCELLANEOUS) ×1 IMPLANT
ELECTRODE REM PT RTRN 9FT ADLT (ELECTROSURGICAL) ×1 IMPLANT
GAUZE SPONGE 4X4 12PLY STRL (GAUZE/BANDAGES/DRESSINGS) ×2 IMPLANT
GAUZE SPONGE 4X4 16PLY XRAY LF (GAUZE/BANDAGES/DRESSINGS) IMPLANT
GLOVE BIO SURGEON STRL SZ8 (GLOVE) ×2 IMPLANT
GLOVE BIO SURGEON STRL SZ8.5 (GLOVE) ×2 IMPLANT
GLOVE EXAM NITRILE LRG STRL (GLOVE) IMPLANT
GLOVE EXAM NITRILE XL STR (GLOVE) IMPLANT
GLOVE EXAM NITRILE XS STR PU (GLOVE) IMPLANT
GOWN STRL REUS W/ TWL LRG LVL3 (GOWN DISPOSABLE) IMPLANT
GOWN STRL REUS W/ TWL XL LVL3 (GOWN DISPOSABLE) ×1 IMPLANT
GOWN STRL REUS W/TWL 2XL LVL3 (GOWN DISPOSABLE) IMPLANT
GOWN STRL REUS W/TWL LRG LVL3 (GOWN DISPOSABLE)
GOWN STRL REUS W/TWL XL LVL3 (GOWN DISPOSABLE) ×1
HEMOSTAT POWDER KIT SURGIFOAM (HEMOSTASIS) ×2 IMPLANT
KIT BASIN OR (CUSTOM PROCEDURE TRAY) ×2 IMPLANT
KIT ROOM TURNOVER OR (KITS) ×2 IMPLANT
NEEDLE HYPO 21X1.5 SAFETY (NEEDLE) IMPLANT
NEEDLE HYPO 22GX1.5 SAFETY (NEEDLE) ×2 IMPLANT
NS IRRIG 1000ML POUR BTL (IV SOLUTION) ×2 IMPLANT
OIL CARTRIDGE MAESTRO DRILL (MISCELLANEOUS) ×2
PACK LAMINECTOMY NEURO (CUSTOM PROCEDURE TRAY) ×2 IMPLANT
PAD ARMBOARD 7.5X6 YLW CONV (MISCELLANEOUS) ×6 IMPLANT
PATTIES SURGICAL .5 X1 (DISPOSABLE) IMPLANT
RUBBERBAND STERILE (MISCELLANEOUS) ×4 IMPLANT
SPONGE SURGIFOAM ABS GEL SZ50 (HEMOSTASIS) ×2 IMPLANT
STRIP CLOSURE SKIN 1/2X4 (GAUZE/BANDAGES/DRESSINGS) ×2 IMPLANT
SUT PROLENE 6 0 BV (SUTURE) ×2 IMPLANT
SUT VIC AB 1 CT1 18XBRD ANBCTR (SUTURE) ×2 IMPLANT
SUT VIC AB 1 CT1 8-18 (SUTURE) ×2
SUT VIC AB 2-0 CP2 18 (SUTURE) ×4 IMPLANT
TAPE CLOTH SURG 4X10 WHT LF (GAUZE/BANDAGES/DRESSINGS) ×2 IMPLANT
TOWEL GREEN STERILE (TOWEL DISPOSABLE) ×2 IMPLANT
TOWEL GREEN STERILE FF (TOWEL DISPOSABLE) ×2 IMPLANT
WATER STERILE IRR 1000ML POUR (IV SOLUTION) ×2 IMPLANT

## 2017-10-29 NOTE — Progress Notes (Signed)
Patient ID: [name], female   DOB: [birth year], 72 y.o.   MRN: [id number] Subjective: The patient is alert and pleasant.  She is in no apparent distress.  She looks well.  Objective: Vital signs in last 24 hours: Temp:  [97.4 F (36.3 C)-98.3 F (36.8 C)] 97.4 F (36.3 C) (01/21 1520) Pulse Rate:  [82-88] 88 (01/21 1550) Resp:  [12-20] 12 (01/21 1550) BP: (122-140)/(74-87) 136/87 (01/21 1550) SpO2:  [100 %] 100 % (01/21 1550) Estimated body mass index is 26 kg/m as calculated from the following:   Height as of 10/25/17: 5\' 7"  (1.702 m).   Weight as of 10/25/17: 75.3 kg (166 lb).   Intake/Output from previous day: No intake/output data recorded. Intake/Output this shift: Total I/O In: 1100 [I.V.:1100] Out: -   Physical exam the patient is alert and pleasant.  She is moving her lower extremities well.  Lab Results: No results for input(s): WBC, HGB, HCT, PLT in the last 72 hours. BMET No results for input(s): NA, K, CL, CO2, GLUCOSE, BUN, CREATININE, CALCIUM in the last 72 hours.  Studies/Results: Dg Lumbar Spine 2-3 Views  Result Date: 10/29/2017 CLINICAL DATA:  Initial evaluation for lumbar laminectomy and foraminotomy EXAM: LUMBAR SPINE - 2-3 VIEW COMPARISON:  Prior radiograph from 10/10/2017. FINDINGS: Two lateral radiographs of the lumbar spine were performed. First radiograph demonstrates surgical instrument overlying the spinous process of L5 at the L5-S1 interspace. Second image demonstrates surgical instrument at the level of L4-5. Multilevel degenerative changes are grossly stable from previous. IMPRESSION: Surgical localization for lumbar laminectomy and foraminotomy, with surgical instruments in place at L4-5 and L5-S1 as above. Electronically Signed   By: Rise MuBenjamin  McClintock M.D.   On: 10/29/2017 14:22    Assessment/Plan: The patient is doing well.  I spoke with the family.  LOS: 0 days     Cristi LoronJeffrey D  10/29/2017, 3:58 PM

## 2017-10-29 NOTE — H&P (Signed)
Subjective: The patient is a 72 year old black female who has complained of back, buttock and leg pain consistent with neurogenic claudication.  She has failed medical management.  She was worked up with a lumbar MRI.  This demonstrated spinal stenosis at L3-4 and L4-5.  I discussed the various treatment options with the patient.  She has decided to proceed with surgery.  Past Medical History:  Diagnosis Date  . Anemia   . Back spasm   . Degenerative arthritis   . Diabetes (HCC)    diet controlled  . Dizziness and giddiness 03/07/2013  . Dyslipidemia   . Gastroesophageal reflux disease   . Hand cramps   . Headache(784.0) 03/07/2013  . History of carpal tunnel syndrome    Bilateral  . Hypertension   . Leg cramps   . Memory deficit 02/25/2014  . Seizures (HCC)    as child- unknown etiology-no meds    Past Surgical History:  Procedure Laterality Date  . ANKLE SURGERY Left    History of ankle fracture  . BACK SURGERY    . COLONOSCOPY N/A 05/04/2014   Procedure: COLONOSCOPY;  Surgeon: West Bali, MD;  Location: [facility];  Service: Endoscopy;  Laterality: N/A;  11:30  . HAND SURGERY     Carpal tunnel syndrome, trigger fingers bilaterally  . KNEE SURGERY Left    Pin, history of fracture  . plate in let leg    . SHOULDER ACROMIOPLASTY Left 06/10/2014   Procedure: SHOULDER ACROMIOPLASTY;  Surgeon: Vickki Hearing, MD;  Location: [facility];  Service: Orthopedics;  Laterality: Left;  . SHOULDER OPEN ROTATOR CUFF REPAIR Left 04/08/2014   Dr. Romeo Apple  . SHOULDER OPEN ROTATOR CUFF REPAIR Left 06/10/2014   Procedure: REVISION ROTATOR CUFF REPAIR SHOULDER OPEN WITH GRAFT;  Surgeon: Vickki Hearing, MD;  Location: [facility];  Service: Orthopedics;  Laterality: Left;  . TUBAL LIGATION      No Known Allergies  Social History   Tobacco Use  . Smoking status: Never Smoker  . Smokeless tobacco: Never Used  Substance Use Topics  . Alcohol use: No    Family History  Problem Relation Age  of Onset  . Heart attack Father   . Colon cancer Neg Hx    Prior to Admission medications   Medication Sig Start Date End Date Taking? Authorizing Provider  acetaminophen (TYLENOL) 500 MG tablet Take 1,000 mg by mouth every 6 (six) hours as needed for moderate pain or headache.   Yes [provider]  Ferrous Gluconate (IRON 27 PO) Take 27 mg by mouth 2 (two) times daily.   Yes [provider]  lisinopril-hydrochlorothiazide (PRINZIDE,ZESTORETIC) 20-12.5 MG tablet Take 1 tablet by mouth daily.   Yes [provider]  magnesium citrate SOLN Take 296 mLs by mouth once.   Yes [provider]  metFORMIN (GLUCOPHAGE) 500 MG tablet Take 500 mg by mouth 2 (two) times daily. 05/04/15  Yes [provider]  Multiple Vitamins-Minerals (SENIOR MULTIVITAMIN PLUS PO) Take 1 tablet by mouth daily.   Yes [provider]  Omega-3 Fatty Acids (FISH OIL) 1000 MG CAPS Take 1,000 mg by mouth daily.    Yes [provider]  OVER THE COUNTER MEDICATION Apply 1 application topically 2 (two) times daily. Triderma Fast Pain Relief Cream   Yes [provider]  Oxycodone HCl 10 MG TABS Take 10 mg 4 (four) times daily by mouth.  02/06/17  Yes [provider]  pravastatin (PRAVACHOL) 40 MG tablet  Take 40 mg by mouth daily. 02/11/14  Yes [provider]  psyllium (REGULOID) 0.52 g capsule Take 0.52 g by mouth 2 (two) times daily.    Yes [provider]  vitamin C (ASCORBIC ACID) 500 MG tablet Take 500 mg by mouth daily.   Yes [provider]  zonisamide (ZONEGRAN) 25 MG capsule TAKE 1 CAPSULE (25 MG TOTAL) BY MOUTH DAILY. 09/10/17  Yes York Spaniel, MD  cyclobenzaprine (FLEXERIL) 10 MG tablet Take 1 tablet (10 mg total) 2 (two) times daily as needed by mouth for muscle spasms. Patient not taking: Reported on 10/19/2017 08/18/17   Donnetta Hutching, MD  naproxen (NAPROSYN) 500 MG tablet Take 500 mg by mouth 2 (two) times daily.  12/23/15   [provider]  oxyCODONE-acetaminophen (PERCOCET) 5-325 MG tablet Take 1 tablet every 4 (four) hours as needed by mouth. Patient not taking: Reported on 10/19/2017 08/18/17   Donnetta Hutching, MD  ranitidine (ZANTAC) 150 MG capsule Take 150 mg by mouth 2 (two) times daily.    [provider]  venlafaxine XR (EFFEXOR-XR) 37.5 MG 24 hr capsule Take 2 capsules (75 mg total) by mouth daily with breakfast. Patient not taking: Reported on 08/18/2017 05/24/17   Butch Penny, NP     Review of Systems  Positive ROS: As above  All other systems have been reviewed and were otherwise negative with the exception of those mentioned in the HPI and as above.  Objective: Vital signs in last 24 hours: Temp:  [98.3 F (36.8 C)] 98.3 F (36.8 C) (01/21 1041) Pulse Rate:  [82] 82 (01/21 1041) Resp:  [20] 20 (01/21 1041) BP: (140)/(76) 140/76 (01/21 1041) SpO2:  [100 %] 100 % (01/21 1041) Estimated body mass index is 26 kg/m as calculated from the following:   Height as of 10/25/17: 5\' 7"  (1.702 m).   Weight as of 10/25/17: 75.3 kg (166 lb).   General Appearance: Alert Head: Normocephalic, without obvious abnormality, atraumatic Eyes: PERRL, conjunctiva/corneas clear, EOM's intact,    Ears: Normal  Throat: Normal  Neck: Supple, Back: unremarkable Lungs: Clear to auscultation bilaterally, respirations unlabored Heart: Regular rate and rhythm, no murmur, rub or gallop Abdomen: Soft, non-tender Extremities: Extremities normal, atraumatic, no cyanosis or edema Skin: unremarkable  NEUROLOGIC:   Mental status: alert and oriented,Motor Exam - grossly normal Sensory Exam - grossly normal Reflexes:  Coordination - grossly normal Gait - grossly normal Balance - grossly normal Cranial Nerves: I: smell Not tested  II: visual acuity  OS: Normal  OD: Normal   II: visual fields Full to confrontation  II: pupils Equal, round, reactive to light  III,VII: ptosis None   III,IV,VI: extraocular muscles  Full ROM  V: mastication Normal  V: facial light touch sensation  Normal  V,VII: corneal reflex  Present  VII: facial muscle function - upper  Normal  VII: facial muscle function - lower Normal  VIII: hearing Not tested  IX: soft palate elevation  Normal  IX,X: gag reflex Present  XI: trapezius strength  5/5  XI: sternocleidomastoid strength 5/5  XI: neck flexion strength  5/5  XII: tongue strength  Normal    Data Review Lab Results  Component Value Date   WBC 8.1 10/25/2017   HGB 11.1 (L) 10/25/2017   HCT 34.3 (L) 10/25/2017   MCV 79.6 10/25/2017   PLT 281 10/25/2017   Lab Results  Component Value Date   NA 139 10/25/2017   K 4.1 10/25/2017   CL 107  10/25/2017   CO2 23 10/25/2017   BUN 14 10/25/2017   CREATININE 1.63 (H) 10/25/2017   GLUCOSE 97 10/25/2017   No results found for: INR, PROTIME  Assessment/Plan: L3-4 and L4-5 spinal stenosis, lumbago, lumbar radiculopathy, neurogenic claudication: I have discussed the situation with the patient.  I have reviewed her imaging studies with her and pointed out the abnormalities.  We have discussed the various treatment options including surgery.  I have described the surgical treatment option of an L3-4 and L4-5 and ectomy/laminotomy/foraminotomies.  I have shown her surgical models.  I have given her a surgical pamphlet.  We have discussed the risks, benefits, alternatives, expected postoperative course, and likelihood of achieving our goals with surgery.  I have answered all the patient's questions.  She has decided to proceed with surgery.   Cristi LoronJeffrey D  10/29/2017 12:43 PM

## 2017-10-29 NOTE — Op Note (Signed)
Brief history: The patient is a 72 year old black female who has complained of back, buttock and leg pain consistent with neurogenic claudication.  She has failed medical management and was worked up with a lumbar MRI and lumbar x-rays.  This demonstrated a thoracolumbar scoliosis, spondylolisthesis, stenosis, etc.  I discussed the various treatment options with the patient including surgery.  She has weighed the risks, benefits, and alternatives to surgery and decided proceed with a L3-4 and L4-5 laminectomy/laminotomy/foraminotomies  Preoperative diagnosis: Thoracolumbar scoliosis, lumbar spondylolisthesis, lumbar spinal stenosis, neurogenic claudication, lumbago, lumbar radiculopathy  Postoperative diagnosis: The same  Procedure: Bilateral L4-5 laminectomy and bilateral L4-5 laminotomy/foraminotomies  using micro-dissection  Surgeon: Dr. Delma OfficerJeff   Asst.: Dr. Jordan LikesPool  Anesthesia: Gen. endotracheal  Estimated blood loss: 100 cc  Drains: None  Complications: None  Description of procedure: The patient was brought to the operating room by the anesthesia team. General endotracheal anesthesia was induced. The patient was turned to the prone position on the Wilson frame. The patient's lumbosacral region was then prepared with Betadine scrub and Betadine solution. Sterile drapes were applied.  I then injected the area to be incised with Marcaine with epinephrine solution. I then used a scalpel to make a linear midline incision over the L3-4 and L4-5 intervertebral disc space. I then used electrocautery to perform a bilateral subperiosteal dissection exposing the spinous process and lamina of L3, L4 and L5. We obtained intraoperative radiograph to confirm our location. I then inserted the [facility] [place] retractor for exposure.  Incised the interspinous ligament at L3-4 and L4-5.  I removed the L4 spinous process using the Leksell rongeur and part of the caudal L3's spinous process.  We then brought  the operative microscope into the field. Under its magnification and illumination we completed the microdissection. I used a high-speed drill to perform a laminotomy at L3-4 and L4-5 bilaterally. I then used a Kerrison punches to complete the L4 laminectomy and to widen the laminotomies at L3-4 and removed the ligamentum flavum at L3-4 and L4-5. We then used microdissection to free up the thecal sac and the bilateral L 4 and L5 nerve root from the epidural tissue. I then used a Kerrison punch to perform a foraminotomy at about the bilateral L4 and L5 nerve root.  We inspected the intervertebral disc at L3-4 and L4-5.  There are no significant herniations.  I then palpated along the ventral surface of the thecal sac and along exit route of the bilateral L4 and L5 nerve root and noted that the neural structures were well decompressed. This completed the decompression.  We then obtained hemostasis using bipolar electrocautery. We irrigated the wound out with bacitracin solution. We then removed the retractor. We then reapproximated the patient's thoracolumbar fascia with interrupted #1 Vicryl suture. We then reapproximated the patient's subcutaneous tissue with interrupted 2-0 Vicryl suture. We then reapproximated patient's skin with Steri-Strips and benzoin. The was then coated with bacitracin ointment. The drapes were removed. The patient was subsequently returned to the supine position where they were extubated by the anesthesia team. The patient was then transported to the postanesthesia care unit in stable condition. All sponge instrument and needle counts were reportedly correct at the end of this case.

## 2017-10-29 NOTE — Anesthesia Procedure Notes (Addendum)
Procedure Name: Intubation Date/Time: 10/29/2017 12:51 PM Performed by: Julian ReilWelty,  F, CRNA Pre-anesthesia Checklist: Patient identified, Emergency Drugs available, Suction available, Patient being monitored and Timeout performed Patient Re-evaluated:Patient Re-evaluated prior to induction Oxygen Delivery Method: Circle system utilized Preoxygenation: Pre-oxygenation with 100% oxygen Induction Type: IV induction Ventilation: Mask ventilation without difficulty Laryngoscope Size: Miller and 3 Grade View: Grade I Tube type: Oral Tube size: 7.0 mm Number of attempts: 1 Airway Equipment and Method: Stylet Placement Confirmation: ETT inserted through vocal cords under direct vision,  positive ETCO2 and breath sounds checked- equal and bilateral Secured at: 22 cm Tube secured with: Tape Dental Injury: Teeth and Oropharynx as per pre-operative assessment  Comments: Noted left upper front tooth with partial cap, filler.  Patient reports other molar crowns, none are loose.  I discussed risk of dental damage, and dental advisory prior to leaving Short-Stay.  4x4s bite block used.

## 2017-10-29 NOTE — Transfer of Care (Signed)
Immediate Anesthesia Transfer of Care Note  Patient: Beth Meza  Procedure(s) Performed: LUMBAR LAMINECTOMY AND FORAMINOTOMY LUMBAR THREE- LUMBAR FOUR, LUMBAR FOUR- LUMBAR FIVE (N/A Spine Lumbar)  Patient Location: PACU  Anesthesia Type:General  Level of Consciousness: awake and patient cooperative  Airway & Oxygen Therapy: Patient Spontanous Breathing and Patient connected to face mask oxygen  Post-op Assessment: Report given to RN and Post -op Vital signs reviewed and stable  Post vital signs: Reviewed and stable  Last Vitals:  Vitals:   10/29/17 1041  BP: 140/76  Pulse: 82  Resp: 20  Temp: 36.8 C  SpO2: 100%    Last Pain:  Vitals:   10/29/17 1041  TempSrc: Oral         Complications: No apparent anesthesia complications

## 2017-10-29 NOTE — Anesthesia Preprocedure Evaluation (Addendum)
Anesthesia Evaluation  Patient identified by MRN, date of birth, ID band Patient awake    Reviewed: Allergy & Precautions, NPO status , Patient's Chart, lab work & pertinent test results  Airway Mallampati: I  TM Distance: >3 FB Neck ROM: Full    Dental  (+) Teeth Intact, Dental Advisory Given, Caps   Pulmonary    Pulmonary exam normal        Cardiovascular hypertension, Pt. on medications Normal cardiovascular exam     Neuro/Psych    GI/Hepatic GERD  Medicated and Controlled,  Endo/Other  diabetes, Type 2  Renal/GU      Musculoskeletal   Abdominal   Peds  Hematology   Anesthesia Other Findings   Reproductive/Obstetrics                            Anesthesia Physical Anesthesia Plan  ASA: II  Anesthesia Plan: General   Post-op Pain Management:    Induction: Intravenous  PONV Risk Score and Plan: 3 and Dexamethasone, Ondansetron and Midazolam  Airway Management Planned: Oral ETT  Additional Equipment:   Intra-op Plan:   Post-operative Plan: Extubation in OR  Informed Consent: I have reviewed the patients History and Physical, chart, labs and discussed the procedure including the risks, benefits and alternatives for the proposed anesthesia with the patient or authorized representative who has indicated his/her understanding and acceptance.     Plan Discussed with: CRNA and Surgeon  Anesthesia Plan Comments:         Anesthesia Quick Evaluation

## 2017-10-29 NOTE — H&P (Signed)
Subjective: The patient is a 72 year old black female who has complained of back, buttock and leg pain consistent with neurogenic claudication.  She has failed medical management  Past Medical History:  Diagnosis Date  . Anemia   . Back spasm   . Degenerative arthritis   . Diabetes (HCC)    diet controlled  . Dizziness and giddiness 03/07/2013  . Dyslipidemia   . Gastroesophageal reflux disease   . Hand cramps   . Headache(784.0) 03/07/2013  . History of carpal tunnel syndrome    Bilateral  . Hypertension   . Leg cramps   . Memory deficit 02/25/2014  . Seizures (HCC)    as child- unknown etiology-no meds    Past Surgical History:  Procedure Laterality Date  . ANKLE SURGERY Left    History of ankle fracture  . BACK SURGERY    . COLONOSCOPY N/A 05/04/2014   Procedure: COLONOSCOPY;  Surgeon: West Bali, MD;  Location: [facility];  Service: Endoscopy;  Laterality: N/A;  11:30  . HAND SURGERY     Carpal tunnel syndrome, trigger fingers bilaterally  . KNEE SURGERY Left    Pin, history of fracture  . plate in let leg    . SHOULDER ACROMIOPLASTY Left 06/10/2014   Procedure: SHOULDER ACROMIOPLASTY;  Surgeon: Vickki Hearing, MD;  Location: [facility];  Service: Orthopedics;  Laterality: Left;  . SHOULDER OPEN ROTATOR CUFF REPAIR Left 04/08/2014   Dr. Romeo Apple  . SHOULDER OPEN ROTATOR CUFF REPAIR Left 06/10/2014   Procedure: REVISION ROTATOR CUFF REPAIR SHOULDER OPEN WITH GRAFT;  Surgeon: Vickki Hearing, MD;  Location: [facility];  Service: Orthopedics;  Laterality: Left;  . TUBAL LIGATION      No Known Allergies  Social History   Tobacco Use  . Smoking status: Never Smoker  . Smokeless tobacco: Never Used  Substance Use Topics  . Alcohol use: No    Family History  Problem Relation Age of Onset  . Heart attack Father   . Colon cancer Neg Hx    Prior to Admission medications   Medication Sig Start Date End Date Taking? Authorizing Provider  acetaminophen (TYLENOL) 500 MG  tablet Take 1,000 mg by mouth every 6 (six) hours as needed for moderate pain or headache.   Yes [provider]  Ferrous Gluconate (IRON 27 PO) Take 27 mg by mouth 2 (two) times daily.   Yes [provider]  lisinopril-hydrochlorothiazide (PRINZIDE,ZESTORETIC) 20-12.5 MG tablet Take 1 tablet by mouth daily.   Yes [provider]  magnesium citrate SOLN Take 296 mLs by mouth once.   Yes [provider]  metFORMIN (GLUCOPHAGE) 500 MG tablet Take 500 mg by mouth 2 (two) times daily. 05/04/15  Yes [provider]  Multiple Vitamins-Minerals (SENIOR MULTIVITAMIN PLUS PO) Take 1 tablet by mouth daily.   Yes [provider]  Omega-3 Fatty Acids (FISH OIL) 1000 MG CAPS Take 1,000 mg by mouth daily.    Yes [provider]  OVER THE COUNTER MEDICATION Apply 1 application topically 2 (two) times daily. Triderma Fast Pain Relief Cream   Yes [provider]  Oxycodone HCl 10 MG TABS Take 10 mg 4 (four) times daily by mouth.  02/06/17  Yes [provider]  pravastatin (PRAVACHOL) 40 MG tablet Take 40 mg by mouth daily. 02/11/14  Yes [provider]  psyllium (REGULOID) 0.52 g capsule Take 0.52 g by mouth 2 (two) times daily.    Yes [provider]  vitamin  C (ASCORBIC ACID) 500 MG tablet Take 500 mg by mouth daily.   Yes [provider]  zonisamide (ZONEGRAN) 25 MG capsule TAKE 1 CAPSULE (25 MG TOTAL) BY MOUTH DAILY. 09/10/17  Yes York SpanielWillis, Charles K, MD  cyclobenzaprine (FLEXERIL) 10 MG tablet Take 1 tablet (10 mg total) 2 (two) times daily as needed by mouth for muscle spasms. Patient not taking: Reported on 10/19/2017 08/18/17   Donnetta Hutchingook, Brian, MD  naproxen (NAPROSYN) 500 MG tablet Take 500 mg by mouth 2 (two) times daily. 12/23/15   [provider]  oxyCODONE-acetaminophen (PERCOCET) 5-325 MG tablet Take 1 tablet every 4 (four) hours as needed by mouth. Patient not taking: Reported on 10/19/2017 08/18/17    Donnetta Hutchingook, Brian, MD  ranitidine (ZANTAC) 150 MG capsule Take 150 mg by mouth 2 (two) times daily.    [provider]  venlafaxine XR (EFFEXOR-XR) 37.5 MG 24 hr capsule Take 2 capsules (75 mg total) by mouth daily with breakfast. Patient not taking: Reported on 08/18/2017 05/24/17   Butch PennyMillikan, Megan, NP     Review of Systems  Positive ROS:   All other systems have been reviewed and were otherwise negative with the exception of those mentioned in the HPI and as above.  Objective: Vital signs in last 24 hours: Temp:  [98.3 F (36.8 C)] 98.3 F (36.8 C) (01/21 1041) Pulse Rate:  [82] 82 (01/21 1041) Resp:  [20] 20 (01/21 1041) BP: (140)/(76) 140/76 (01/21 1041) SpO2:  [100 %] 100 % (01/21 1041) Estimated body mass index is 26 kg/m as calculated from the following:   Height as of 10/25/17: 5\' 7"  (1.702 m).   Weight as of 10/25/17: 75.3 kg (166 lb).   General Appearance: Alert Head: Normocephalic, without obvious abnormality, atraumatic Eyes: PERRL, conjunctiva/corneas clear, EOM's intact,    Ears: Normal  Throat: Normal  Neck: Supple, Back: unremarkable Lungs: Clear to auscultation bilaterally, respirations unlabored Heart: Regular rate and rhythm, no murmur, rub or gallop Abdomen: Soft, non-tender Extremities: Extremities normal, atraumatic, no cyanosis or edema Skin: unremarkable  NEUROLOGIC:   Mental status: alert and oriented,Motor Exam - grossly normal Sensory Exam - grossly normal Reflexes:  Coordination - grossly normal Gait - grossly normal Balance - grossly normal Cranial Nerves: I: smell Not tested  II: visual acuity  OS: Normal  OD: Normal   II: visual fields Full to confrontation  II: pupils Equal, round, reactive to light  III,VII: ptosis None  III,IV,VI: extraocular muscles  Full ROM  V: mastication Normal  V: facial light touch sensation  Normal  V,VII: corneal reflex  Present  VII: facial muscle function - upper  Normal  VII: facial muscle  function - lower Normal  VIII: hearing Not tested  IX: soft palate elevation  Normal  IX,X: gag reflex Present  XI: trapezius strength  5/5  XI: sternocleidomastoid strength 5/5  XI: neck flexion strength  5/5  XII: tongue strength  Normal    Data Review Lab Results  Component Value Date   WBC 8.1 10/25/2017   HGB 11.1 (L) 10/25/2017   HCT 34.3 (L) 10/25/2017   MCV 79.6 10/25/2017   PLT 281 10/25/2017   Lab Results  Component Value Date   NA 139 10/25/2017   K 4.1 10/25/2017   CL 107 10/25/2017   CO2 23 10/25/2017   BUN 14 10/25/2017   CREATININE 1.63 (H) 10/25/2017   GLUCOSE 97 10/25/2017   No results found for: INR, PROTIME  Assessment/Plan:    Duane LopeJeffrey D   10/29/2017 12:39 PM

## 2017-10-30 ENCOUNTER — Encounter (HOSPITAL_COMMUNITY): Payer: Self-pay | Admitting: Neurosurgery

## 2017-10-30 DIAGNOSIS — M48062 Spinal stenosis, lumbar region with neurogenic claudication: Secondary | ICD-10-CM | POA: Diagnosis not present

## 2017-10-30 LAB — CBC
HCT: 29.2 % — ABNORMAL LOW (ref 36.0–46.0)
Hemoglobin: 9.5 g/dL — ABNORMAL LOW (ref 12.0–15.0)
MCH: 25.9 pg — AB (ref 26.0–34.0)
MCHC: 32.5 g/dL (ref 30.0–36.0)
MCV: 79.6 fL (ref 78.0–100.0)
PLATELETS: 229 10*3/uL (ref 150–400)
RBC: 3.67 MIL/uL — ABNORMAL LOW (ref 3.87–5.11)
RDW: 14 % (ref 11.5–15.5)
WBC: 11.5 10*3/uL — AB (ref 4.0–10.5)

## 2017-10-30 LAB — BASIC METABOLIC PANEL
Anion gap: 11 (ref 5–15)
BUN: 11 mg/dL (ref 6–20)
CALCIUM: 8.8 mg/dL — AB (ref 8.9–10.3)
CO2: 22 mmol/L (ref 22–32)
Chloride: 102 mmol/L (ref 101–111)
Creatinine, Ser: 1.53 mg/dL — ABNORMAL HIGH (ref 0.44–1.00)
GFR calc Af Amer: 39 mL/min — ABNORMAL LOW (ref >60)
GFR, EST NON AFRICAN AMERICAN: 34 mL/min — AB (ref >60)
Glucose, Bld: 103 mg/dL — ABNORMAL HIGH (ref 65–99)
Potassium: 4.6 mmol/L (ref 3.5–5.1)
Sodium: 135 mmol/L (ref 135–145)

## 2017-10-30 LAB — GLUCOSE, CAPILLARY: Glucose-Capillary: 91 mg/dL (ref 65–99)

## 2017-10-30 MED ORDER — CYCLOBENZAPRINE HCL 10 MG PO TABS: 10.0000 mg | ORAL_TABLET | Freq: Three times a day (TID) | ORAL | 1 refills | Status: DC | PRN

## 2017-10-30 MED ORDER — DOCUSATE SODIUM 100 MG PO CAPS: 100.0000 mg | ORAL_CAPSULE | Freq: Two times a day (BID) | ORAL | 0 refills | Status: DC

## 2017-10-30 MED ORDER — OXYCODONE HCL 10 MG PO TABS: 10.0000 mg | ORAL_TABLET | ORAL | 0 refills | Status: DC | PRN

## 2017-10-30 MED FILL — Thrombin For Soln 5000 Unit: CUTANEOUS | Qty: 5000 | Status: AC

## 2017-10-30 MED FILL — Thrombin For Soln 5000 Unit: CUTANEOUS | Qty: 2 | Status: AC

## 2017-10-30 NOTE — Anesthesia Postprocedure Evaluation (Signed)
Anesthesia Post Note  Patient: Waldron LabsBeulah M Naramore  Procedure(s) Performed: LUMBAR LAMINECTOMY AND FORAMINOTOMY LUMBAR THREE- LUMBAR FOUR, LUMBAR FOUR- LUMBAR FIVE (N/A Spine Lumbar)     Patient location during evaluation: PACU Anesthesia Type: General Level of consciousness: awake and alert Pain management: pain level controlled Vital Signs Assessment: post-procedure vital signs reviewed and stable Respiratory status: spontaneous breathing, nonlabored ventilation, respiratory function stable and patient connected to nasal cannula oxygen Cardiovascular status: blood pressure returned to baseline and stable Postop Assessment: no apparent nausea or vomiting Anesthetic complications: no    Last Vitals:  Vitals:   10/30/17 0342 10/30/17 0749  BP: 124/65 128/83  Pulse: 94 84  Resp: 20 18  Temp: 37.2 C 36.6 C  SpO2: 100% 100%    Last Pain:  Vitals:   10/30/17 0342  TempSrc: Oral  PainSc: 5                   

## 2017-10-30 NOTE — Discharge Summary (Signed)
Physician Discharge Summary  Patient ID: Beth Meza MRN: [id number] DOB/AGE: 10-17-1945 72 y.o.  Admit date: 10/29/2017 Discharge date: 10/30/2017  Admission Diagnoses: L3-4 and L4-5 spinal stenosis, lumbago, lumbar radiculopathy, neurogenic claudication, thoracic lumbar scoliosis  Discharge Diagnoses: The same Active Problems:   Spinal stenosis of lumbar region with neurogenic claudication   Discharged Condition: good  Hospital Course: I performed an L3-4 and L4-5 laminectomy/laminotomy/foraminotomy on the patient on 10/29/2017.  The patient's postoperative course was unremarkable.  On postoperative day #1 she requested discharge home.  She was given written and oral discharge instructions.  All her questions were answered.  Consults: Physical therapy Significant Diagnostic Studies: None Treatments: L3-4 and L4-5 laminectomy/laminotomy/foraminotomies using microdissection Discharge Exam: Blood pressure 128/83, pulse 84, temperature 97.9 F (36.6 C), resp. rate 18, SpO2 100 %. The patient is alert and pleasant.  Her dressing is clean and dry.  Her strength is grossly normal in her lower extremities.  Disposition: Home  Discharge Instructions    Call MD for:  difficulty breathing, headache or visual disturbances   Complete by:  As directed    Call MD for:  extreme fatigue   Complete by:  As directed    Call MD for:  hives   Complete by:  As directed    Call MD for:  persistant dizziness or light-headedness   Complete by:  As directed    Call MD for:  persistant nausea and vomiting   Complete by:  As directed    Call MD for:  redness, tenderness, or signs of infection (pain, swelling, redness, odor or green/yellow discharge around incision site)   Complete by:  As directed    Call MD for:  severe uncontrolled pain   Complete by:  As directed    Call MD for:  temperature >100.4   Complete by:  As directed    Diet - low sodium heart healthy   Complete by:  As directed     Discharge instructions   Complete by:  As directed    Call [phone] for a followup appointment. Take a stool softener while you are using pain medications.   Driving Restrictions   Complete by:  As directed    Do not drive for 2 weeks.   Increase activity slowly   Complete by:  As directed    Lifting restrictions   Complete by:  As directed    Do not lift more than 5 pounds. No excessive bending or twisting.   May shower / Bathe   Complete by:  As directed    He may shower after the pain she is removed 3 days after surgery. Leave the incision alone.   Remove dressing in 48 hours   Complete by:  As directed    Your stitches are under the scan and will dissolve by themselves. The Steri-Strips will fall off after you take a few showers. Do not rub back or pick at the wound, Leave the wound alone.     Allergies as of 10/30/2017   No Known Allergies     Medication List    STOP taking these medications   acetaminophen 500 MG tablet Commonly known as:  TYLENOL   oxyCODONE-acetaminophen 5-325 MG tablet Commonly known as:  PERCOCET   ranitidine 150 MG capsule Commonly known as:  ZANTAC     TAKE these medications   cyclobenzaprine 10 MG tablet Commonly known as:  FLEXERIL Take 1 tablet (10 mg total) 2 (two) times daily as needed by  mouth for muscle spasms. What changed:  Another medication with the same name was added. Make sure you understand how and when to take each.   cyclobenzaprine 10 MG tablet Commonly known as:  FLEXERIL Take 1 tablet (10 mg total) by mouth 3 (three) times daily as needed for muscle spasms. What changed:  You were already taking a medication with the same name, and this prescription was added. Make sure you understand how and when to take each.   docusate sodium 100 MG capsule Commonly known as:  COLACE Take 1 capsule (100 mg total) by mouth 2 (two) times daily.   Fish Oil 1000 MG Caps Take 1,000 mg by mouth daily.   IRON 27 PO Take 27 mg by  mouth 2 (two) times daily.   lisinopril-hydrochlorothiazide 20-12.5 MG tablet Commonly known as:  PRINZIDE,ZESTORETIC Take 1 tablet by mouth daily.   magnesium citrate Soln Take 296 mLs by mouth once.   metFORMIN 500 MG tablet Commonly known as:  GLUCOPHAGE Take 500 mg by mouth 2 (two) times daily.   naproxen 500 MG tablet Commonly known as:  NAPROSYN Take 500 mg by mouth 2 (two) times daily.   OVER THE COUNTER MEDICATION Apply 1 application topically 2 (two) times daily. Triderma Fast Pain Relief Cream   Oxycodone HCl 10 MG Tabs Take 1 tablet (10 mg total) by mouth every 4 (four) hours as needed for severe pain ((score 7 to 10)). What changed:    when to take this  reasons to take this   pravastatin 40 MG tablet Commonly known as:  PRAVACHOL Take 40 mg by mouth daily.   psyllium 0.52 g capsule Commonly known as:  REGULOID Take 0.52 g by mouth 2 (two) times daily.   SENIOR MULTIVITAMIN PLUS PO Take 1 tablet by mouth daily.   venlafaxine XR 37.5 MG 24 hr capsule Commonly known as:  EFFEXOR-XR Take 2 capsules (75 mg total) by mouth daily with breakfast.   vitamin C 500 MG tablet Commonly known as:  ASCORBIC ACID Take 500 mg by mouth daily.   zonisamide 25 MG capsule Commonly known as:  ZONEGRAN TAKE 1 CAPSULE (25 MG TOTAL) BY MOUTH DAILY.        Signed: Cristi LoronJeffrey D  10/30/2017, 7:51 AM

## 2017-10-30 NOTE — Evaluation (Signed)
Physical Therapy Evaluation Patient Details Name: Beth Meza MRN: [id number] DOB: 1946-04-18 Today's Date: 10/30/2017   History of Present Illness  Pt is a 72 y/o female who presents s/p L3-L5 laminectomy/microdiscectomy on 10/29/17. PMH significant for seizures.   Clinical Impression  Pt admitted with above diagnosis. Pt currently with functional limitations due to the deficits listed below (see PT Problem List). At the time of PT eval pt was able to perform transfers and ambulation with gross min guard assist and RW for support. Pt and son were educated on precautions, car transfer, and general safety with activity progression. Pt will benefit from skilled PT to increase their independence and safety with mobility to allow discharge to the venue listed below.       Follow Up Recommendations No PT follow up;Supervision for mobility/OOB    Equipment Recommendations  Rolling walker with 5" wheels    Recommendations for Other Services       Precautions / Restrictions Precautions Precautions: Fall;Back Precaution Booklet Issued: Yes (comment) Precaution Comments: Reviewed handout in detail with pt and son. Pt was cued for precautions during functional mobility.  Required Braces or Orthoses: ("No brace needed" order) Restrictions Weight Bearing Restrictions: No      Mobility  Bed Mobility Overal bed mobility: Needs Assistance Bed Mobility: Rolling;Sidelying to Sit;Sit to Sidelying Rolling: Min guard Sidelying to sit: Min guard     Sit to sidelying: Min guard General bed mobility comments: Close guard for safety as pt transitioned to/from EOB. Pt was cued for log roll technique and maintenance of precautions.   Transfers Overall transfer level: Needs assistance Equipment used: Rolling walker (2 wheeled) Transfers: Sit to/from Stand Sit to Stand: Min guard         General transfer comment: Close guard for safety as pt powered up to full standing position. VC's for hand  placement on seated surface for safety.   Ambulation/Gait Ambulation/Gait assistance: Min guard Ambulation Distance (Feet): 300 Feet Assistive device: Rolling walker (2 wheeled) Gait Pattern/deviations: Step-through pattern;Decreased stride length;Trunk flexed Gait velocity: Decreased Gait velocity interpretation: Below normal speed for age/gender General Gait Details: VC's for improved posture, forward gaze, and general safety with the RW. Pt moving slow but appears fairly steady.   Stairs            Wheelchair Mobility    Modified Rankin (Stroke Patients Only)       Balance Overall balance assessment: Needs assistance Sitting-balance support: Feet supported;No upper extremity supported Sitting balance-Leahy Scale: Fair     Standing balance support: No upper extremity supported;During functional activity Standing balance-Leahy Scale: Poor Standing balance comment: Reliant on RW for support                             Pertinent Vitals/Pain Pain Assessment: Faces Faces Pain Scale: Hurts a little bit Pain Location: Incision site Pain Descriptors / Indicators: Operative site guarding Pain Intervention(s): Limited activity within patient's tolerance;Monitored during session;Repositioned    Home Living Family/patient expects to be discharged to:: Private residence Living Arrangements: Alone Available Help at Discharge: Family;Available 24 hours/day;Personal care attendant Type of Home: House Home Access: Stairs to enter   [employer] of Steps: 1 Home Layout: One level Home Equipment: Cane - single point;Tub bench;Hand held shower head Additional Comments: Above information is for pt's home. At end of session pt's son reports that pt will be going to her sister's house, but the pt did not know  that yet and thinks she is going home at d/c. Pt's son asking therapist not to tell her.     Prior Function Level of Independence: Needs assistance    Gait / Transfers Assistance Needed: SPC  ADL's / Homemaking Assistance Needed: Pt reports she has an aide that comes in for "a few hours every day". The aide fixes meals for the pt, assists with bathing/dressing, and light housework.         Hand Dominance        Extremity/Trunk Assessment   Upper Extremity Assessment Upper Extremity Assessment: Defer to OT evaluation    Lower Extremity Assessment Lower Extremity Assessment: Generalized weakness(Consistent with pre-op diagnosis)    Cervical / Trunk Assessment Cervical / Trunk Assessment: Other exceptions Cervical / Trunk Exceptions: s/p surgery  Communication   Communication: No difficulties  Cognition Arousal/Alertness: Awake/alert Behavior During Therapy: WFL for tasks assessed/performed Overall Cognitive Status: History of cognitive impairments - at baseline                                 General Comments: Questionable cognition with reported memory deficits at baseline. Noted decreased attention and slow processing at times during evaluation.       General Comments      Exercises     Assessment/Plan    PT Assessment Patient needs continued PT services  PT Problem List Decreased strength;Decreased range of motion;Decreased activity tolerance;Decreased balance;Decreased mobility;Decreased safety awareness;Decreased knowledge of use of DME;Decreased knowledge of precautions;Pain       PT Treatment Interventions DME instruction;Gait training;Stair training;Functional mobility training;Therapeutic activities;Therapeutic exercise;Neuromuscular re-education;Patient/family education    PT Goals (Current goals can be found in the Care Plan section)  Acute Rehab PT Goals Patient Stated Goal: Back to PLOF PT Goal Formulation: With patient/family Time For Goal Achievement: 11/13/17 Potential to Achieve Goals: Good    Frequency Min 5X/week   Barriers to discharge        Co-evaluation                AM-PAC PT "6 Clicks" Daily Activity  Outcome Measure Difficulty turning over in bed (including adjusting bedclothes, sheets and blankets)?: None Difficulty moving from lying on back to sitting on the side of the bed? : A Little Difficulty sitting down on and standing up from a chair with arms (e.g., wheelchair, bedside commode, etc,.)?: A Little Help needed moving to and from a bed to chair (including a wheelchair)?: A Little Help needed walking in hospital room?: A Little Help needed climbing 3-5 steps with a railing? : A Little 6 Click Score: 19    End of Session Equipment Utilized During Treatment: Gait belt Activity Tolerance: Patient tolerated treatment well Patient left: in bed;with call bell/phone within reach;with family/visitor present Nurse Communication: Mobility status PT Visit Diagnosis: Unsteadiness on feet (R26.81);Pain Pain - part of body: (back)    Time: [id number] PT Time Calculation (min) (ACUTE ONLY): 23 min   Charges:   PT Evaluation $PT Eval Moderate Complexity: 1 Mod PT Treatments $Gait Training: 8-22 mins   PT G Codes:        Conni SlipperLaura , PT, DPT Acute Rehabilitation Services Pager: [phone]   Marylynn PearsonLaura D  10/30/2017, 11:18 AM

## 2017-10-30 NOTE — Progress Notes (Signed)
Patient alert and oriented, mae's well, voiding adequate amount of urine, swallowing without difficulty, no c/o pain at time of discharge. Patient discharged home with family. Script and discharged instructions given to patient. Patient and family stated understanding of instructions given. Patient has an appointment with Dr. jenkins    

## 2017-12-07 ENCOUNTER — Other Ambulatory Visit: Payer: Self-pay | Admitting: Neurology

## 2018-02-02 ENCOUNTER — Other Ambulatory Visit: Payer: Self-pay | Admitting: Adult Health

## 2018-02-07 ENCOUNTER — Other Ambulatory Visit: Payer: Self-pay | Admitting: Adult Health

## 2018-03-05 ENCOUNTER — Other Ambulatory Visit (HOSPITAL_COMMUNITY): Payer: Self-pay | Admitting: Family

## 2018-03-05 DIAGNOSIS — Z1231 Encounter for screening mammogram for malignant neoplasm of breast: Secondary | ICD-10-CM

## 2018-03-07 ENCOUNTER — Encounter (HOSPITAL_COMMUNITY): Payer: Self-pay

## 2018-03-07 ENCOUNTER — Ambulatory Visit (HOSPITAL_COMMUNITY)
Admission: RE | Admit: 2018-03-07 | Discharge: 2018-03-07 | Disposition: A | Discharge status: Home / Self Care | Payer: Medicare Other | Source: Ambulatory Visit | Attending: Family | Admitting: Family

## 2018-03-07 DIAGNOSIS — Z1231 Encounter for screening mammogram for malignant neoplasm of breast: Secondary | ICD-10-CM | POA: Diagnosis not present

## 2018-03-12 ENCOUNTER — Other Ambulatory Visit (HOSPITAL_COMMUNITY): Payer: Self-pay | Admitting: Family

## 2018-03-12 DIAGNOSIS — Z780 Asymptomatic menopausal state: Secondary | ICD-10-CM

## 2018-03-14 ENCOUNTER — Ambulatory Visit (HOSPITAL_COMMUNITY)
Admission: RE | Admit: 2018-03-14 | Discharge: 2018-03-14 | Disposition: A | Discharge status: Home / Self Care | Payer: Medicare Other | Source: Ambulatory Visit | Attending: Family | Admitting: Family

## 2018-03-14 DIAGNOSIS — Z780 Asymptomatic menopausal state: Secondary | ICD-10-CM | POA: Insufficient documentation

## 2018-03-14 DIAGNOSIS — M85852 Other specified disorders of bone density and structure, left thigh: Secondary | ICD-10-CM | POA: Diagnosis not present

## 2018-04-04 ENCOUNTER — Other Ambulatory Visit (HOSPITAL_COMMUNITY): Payer: Self-pay | Admitting: Neurosurgery

## 2018-04-04 DIAGNOSIS — M5416 Radiculopathy, lumbar region: Secondary | ICD-10-CM

## 2018-04-26 ENCOUNTER — Ambulatory Visit (HOSPITAL_COMMUNITY)
Admission: RE | Admit: 2018-04-26 | Discharge: 2018-04-26 | Disposition: A | Discharge status: Home / Self Care | Payer: Medicare Other | Source: Ambulatory Visit | Attending: Neurosurgery | Admitting: Neurosurgery

## 2018-04-26 DIAGNOSIS — M5416 Radiculopathy, lumbar region: Secondary | ICD-10-CM | POA: Diagnosis present

## 2018-04-26 DIAGNOSIS — M48061 Spinal stenosis, lumbar region without neurogenic claudication: Secondary | ICD-10-CM | POA: Insufficient documentation

## 2018-04-26 DIAGNOSIS — M4807 Spinal stenosis, lumbosacral region: Secondary | ICD-10-CM | POA: Insufficient documentation

## 2018-04-26 DIAGNOSIS — M8938 Hypertrophy of bone, other site: Secondary | ICD-10-CM | POA: Insufficient documentation

## 2018-04-26 LAB — POCT I-STAT CREATININE: Creatinine, Ser: 1.9 mg/dL — ABNORMAL HIGH (ref 0.44–1.00)

## 2018-04-26 MED ORDER — GADOBENATE DIMEGLUMINE 529 MG/ML IV SOLN
7.0000 mL | Freq: Once | INTRAVENOUS | Status: AC | PRN
  Administered 2018-04-26: 7 mL via INTRAVENOUS

## 2018-07-01 ENCOUNTER — Other Ambulatory Visit (HOSPITAL_COMMUNITY): Payer: Self-pay | Admitting: Internal Medicine

## 2018-07-01 DIAGNOSIS — R944 Abnormal results of kidney function studies: Secondary | ICD-10-CM

## 2018-07-05 ENCOUNTER — Ambulatory Visit (HOSPITAL_COMMUNITY)
Admission: RE | Admit: 2018-07-05 | Discharge: 2018-07-05 | Disposition: A | Discharge status: Home / Self Care | Payer: Medicare Other | Source: Ambulatory Visit | Attending: Internal Medicine | Admitting: Internal Medicine

## 2018-07-05 DIAGNOSIS — R944 Abnormal results of kidney function studies: Secondary | ICD-10-CM

## 2018-08-16 ENCOUNTER — Encounter (HOSPITAL_COMMUNITY): Payer: Self-pay

## 2018-08-16 ENCOUNTER — Other Ambulatory Visit (HOSPITAL_COMMUNITY): Payer: Self-pay | Admitting: Internal Medicine

## 2018-08-16 ENCOUNTER — Ambulatory Visit (HOSPITAL_COMMUNITY)
Admission: RE | Admit: 2018-08-16 | Discharge: 2018-08-16 | Disposition: A | Discharge status: Home / Self Care | Payer: Medicare Other | Source: Ambulatory Visit | Attending: Internal Medicine | Admitting: Internal Medicine

## 2018-08-16 DIAGNOSIS — R519 Headache, unspecified: Secondary | ICD-10-CM

## 2018-08-16 DIAGNOSIS — M25512 Pain in left shoulder: Secondary | ICD-10-CM

## 2018-08-16 DIAGNOSIS — W19XXXA Unspecified fall, initial encounter: Secondary | ICD-10-CM | POA: Diagnosis not present

## 2018-08-16 DIAGNOSIS — R51 Headache: Secondary | ICD-10-CM | POA: Diagnosis present

## 2018-08-16 DIAGNOSIS — M25532 Pain in left wrist: Secondary | ICD-10-CM

## 2018-08-16 DIAGNOSIS — M6281 Muscle weakness (generalized): Secondary | ICD-10-CM | POA: Diagnosis not present

## 2018-08-19 ENCOUNTER — Ambulatory Visit (HOSPITAL_COMMUNITY)
Admission: RE | Admit: 2018-08-19 | Discharge: 2018-08-19 | Disposition: A | Discharge status: Home / Self Care | Payer: Medicare Other | Source: Ambulatory Visit | Attending: Internal Medicine | Admitting: Internal Medicine

## 2018-08-19 DIAGNOSIS — W108XXD Fall (on) (from) other stairs and steps, subsequent encounter: Secondary | ICD-10-CM | POA: Diagnosis not present

## 2018-08-19 DIAGNOSIS — M25511 Pain in right shoulder: Secondary | ICD-10-CM | POA: Insufficient documentation

## 2018-08-19 DIAGNOSIS — M25512 Pain in left shoulder: Secondary | ICD-10-CM

## 2018-08-29 NOTE — Progress Notes (Signed)
NEW PATIENT OFFICE VISIT  Chief Complaint  Patient presents with  . Shoulder Pain    right s/p fall 08/15/18    72 year old female presents for evaluation of right shoulder pain  2 weeks ago she was in her yard she fell injured her right shoulder contusion the right knee and leg contusion left arm presents complaining of 9 out of 10 sharp and dull aching pain in the right shoulder proximally with no radiation and inability to raise her right arm.   Review of Systems  Constitutional: Negative for chills and fever.  Neurological: Positive for focal weakness. Negative for sensory change.     Past Medical History:  Diagnosis Date  . Anemia   . Back spasm   . Degenerative arthritis   . Diabetes (HCC)    diet controlled  . Dizziness and giddiness 03/07/2013  . Dyslipidemia   . Gastroesophageal reflux disease   . Hand cramps   . Headache(784.0) 03/07/2013  . History of carpal tunnel syndrome    Bilateral  . Hypertension   . Leg cramps   . Memory deficit 02/25/2014  . Seizures (HCC)    as child- unknown etiology-no meds    Past Surgical History:  Procedure Laterality Date  . ANKLE SURGERY Left    History of ankle fracture  . BACK SURGERY    . COLONOSCOPY N/A 05/04/2014   Procedure: COLONOSCOPY;  Surgeon: West BaliSandi L Fields, MD;  Location: [facility];  Service: Endoscopy;  Laterality: N/A;  11:30  . HAND SURGERY     Carpal tunnel syndrome, trigger fingers bilaterally  . KNEE SURGERY Left    Pin, history of fracture  . LUMBAR LAMINECTOMY/DECOMPRESSION MICRODISCECTOMY N/A 10/29/2017   Procedure: LUMBAR LAMINECTOMY AND FORAMINOTOMY LUMBAR THREE- LUMBAR FOUR, LUMBAR FOUR- LUMBAR FIVE;  Surgeon: Tressie StalkerJenkins, Jeffrey, MD;  [facility];  Service: Neurosurgery;  Laterality: N/A;  . plate in let leg    . SHOULDER ACROMIOPLASTY Left 06/10/2014   Procedure: SHOULDER ACROMIOPLASTY;  Surgeon: Vickki HearingStanley E Harrison, MD;  Location: [facility];  Service: Orthopedics;  Laterality: Left;  . SHOULDER  OPEN ROTATOR CUFF REPAIR Left 04/08/2014   Dr. Romeo AppleHarrison  . SHOULDER OPEN ROTATOR CUFF REPAIR Left 06/10/2014   Procedure: REVISION ROTATOR CUFF REPAIR SHOULDER OPEN WITH GRAFT;  Surgeon: Vickki HearingStanley E Harrison, MD;  Location: [facility];  Service: Orthopedics;  Laterality: Left;  . TUBAL LIGATION      Family History  Problem Relation Age of Onset  . Heart attack Father   . Colon cancer Neg Hx    Social History   Tobacco Use  . Smoking status: Never Smoker  . Smokeless tobacco: Never Used  Substance Use Topics  . Alcohol use: No  . Drug use: No    No Known Allergies  Current Meds  Medication Sig  . cyclobenzaprine (FLEXERIL) 10 MG tablet Take 1 tablet (10 mg total) by mouth 3 (three) times daily as needed for muscle spasms.  . Ferrous Gluconate (IRON 27 PO) Take 27 mg by mouth 2 (two) times daily.  Marland Kitchen. ibuprofen (ADVIL,MOTRIN) 800 MG tablet   . lisinopril-hydrochlorothiazide (PRINZIDE,ZESTORETIC) 20-12.5 MG tablet Take 1 tablet by mouth daily.  . metFORMIN (GLUCOPHAGE) 500 MG tablet Take 500 mg by mouth 2 (two) times daily.  . Multiple Vitamins-Minerals (SENIOR MULTIVITAMIN PLUS PO) Take 1 tablet by mouth daily.  . Omega-3 Fatty Acids (FISH OIL) 1000 MG CAPS Take 1,000 mg by mouth daily.   . pravastatin (PRAVACHOL) 40 MG tablet Take 40 mg  by mouth daily.  . ranitidine (ZANTAC) 150 MG tablet TAKE 1 TABLET BY MOUTH TWICE A DAY  . vitamin C (ASCORBIC ACID) 500 MG tablet Take 500 mg by mouth daily.    BP (!) 171/95   Pulse 95   Ht 5\' 6"  (1.676 m)   Wt 172 lb (78 kg)   BMI 27.76 kg/m   Physical Exam  Constitutional: She is oriented to person, place, and time. She appears well-developed and well-nourished.  Neurological: She is alert and oriented to person, place, and time.  Psychiatric: She has a normal mood and affect. Judgment normal.  Vitals reviewed.   Ortho Exam Left shoulder previous surgical incision strength in the cuff is normal shoulder is stable she has functional range of  motion no tenderness or swelling neurovascular exam is intact.  Right shoulder is tender over the proximal humerus and anterior deltoid and joint line with active motion of 45 degrees abduction 40 flexion stability test on inferior test were blunted by the range of motion loss and the pain she has weakness of the supraspinatus tendon 3 out of 5 internal/external rotation strength 4 out of 5 skin is normal she has a good pulse there is no lymphadenopathy in the axilla and her sensory exam is normal  She ambulates with a cane   MEDICAL DECISION SECTION  Xrays were done at [facility], 08/19/18   My independent reading of xrays:  3 views right shoulder status post trauma Good humeral joint looks normal acromioclavicular joint looks arthritic greater tuberosity looks normal glenoid looks normal acromion looks to be a type III  Encounter Diagnosis  Name Primary?  . Traumatic complete tear of right rotator cuff, initial encounter Yes    PLAN: (Rx., injectx, surgery, frx, mri/ct) MRI RIGHT SHOULDER  ADD ULTRACET   Meds ordered this encounter  Medications  . traMADol-acetaminophen (ULTRACET) 37.5-325 MG tablet    Sig: Take 1 tablet by mouth every 4 (four) hours as needed for up to 5 days.    Dispense:  30 tablet    Refill:  0    Fuller Canada, MD  08/30/2018 10:06 AM

## 2018-08-30 ENCOUNTER — Ambulatory Visit (INDEPENDENT_AMBULATORY_CARE_PROVIDER_SITE_OTHER): Payer: Medicare Other | Admitting: Orthopedic Surgery

## 2018-08-30 ENCOUNTER — Telehealth: Payer: Self-pay | Admitting: Radiology

## 2018-08-30 ENCOUNTER — Encounter: Payer: Self-pay | Admitting: Orthopedic Surgery

## 2018-08-30 VITALS — BP 171/95 | HR 95 | Ht 66.0 in | Wt 172.0 lb

## 2018-08-30 DIAGNOSIS — S46011A Strain of muscle(s) and tendon(s) of the rotator cuff of right shoulder, initial encounter: Secondary | ICD-10-CM

## 2018-08-30 MED ORDER — TRAMADOL-ACETAMINOPHEN 37.5-325 MG PO TABS: 1.0000 | ORAL_TABLET | ORAL | 0 refills | Status: AC | PRN

## 2018-08-30 NOTE — Telephone Encounter (Signed)
Mailbox full

## 2018-08-30 NOTE — Telephone Encounter (Signed)
No prior Berkley Harveyauth is required for the MRI I have called to schedule. It is sch for Nov 29th 4pm arrive at 330

## 2018-08-30 NOTE — Patient Instructions (Signed)
CONTINUE IBUPROFEN   ADD ULTRACET   MRI WILL BE ORDERED AND YOU WILL COME BACK FOR RESULTS

## 2018-09-03 NOTE — Telephone Encounter (Signed)
I called again, someone said hello, then nothing. Unable to reach.

## 2018-09-06 ENCOUNTER — Ambulatory Visit (HOSPITAL_COMMUNITY): Admission: RE | Admit: 2018-09-06 | Payer: Medicare Other | Source: Ambulatory Visit

## 2018-10-11 ENCOUNTER — Ambulatory Visit (HOSPITAL_COMMUNITY)
Admission: RE | Admit: 2018-10-11 | Discharge: 2018-10-11 | Disposition: A | Discharge status: Home / Self Care | Payer: Medicare Other | Source: Ambulatory Visit | Attending: Orthopedic Surgery | Admitting: Orthopedic Surgery

## 2018-10-11 DIAGNOSIS — S46011A Strain of muscle(s) and tendon(s) of the rotator cuff of right shoulder, initial encounter: Secondary | ICD-10-CM

## 2018-10-23 ENCOUNTER — Ambulatory Visit (INDEPENDENT_AMBULATORY_CARE_PROVIDER_SITE_OTHER): Payer: Medicare Other | Admitting: Orthopedic Surgery

## 2018-10-23 ENCOUNTER — Encounter: Payer: Self-pay | Admitting: Orthopedic Surgery

## 2018-10-23 VITALS — BP 150/88 | HR 94 | Ht 66.0 in | Wt 174.0 lb

## 2018-10-23 DIAGNOSIS — S46011D Strain of muscle(s) and tendon(s) of the rotator cuff of right shoulder, subsequent encounter: Secondary | ICD-10-CM

## 2018-10-23 MED ORDER — HYDROCODONE-ACETAMINOPHEN 5-325 MG PO TABS: 1.0000 | ORAL_TABLET | Freq: Four times a day (QID) | ORAL | 0 refills | Status: DC | PRN

## 2018-10-23 NOTE — Addendum Note (Signed)
Addended byCaffie Damme on: 10/23/2018 11:42 AM   Modules accepted: Orders, SmartSet

## 2018-10-23 NOTE — Patient Instructions (Signed)
You have decided to proceed with rotator cuff repair surgery. You have decided not to continue with nonoperative measures such as but not limited to oral medication,   activity modification, physical therapy, or injection.  We will perform a rotator cuff repair. Some of the risks associated with rotator cuff repair include but are not limited to Bleeding Infection Swelling Stiffness Blood clot Pain Re-tearing of the rotator cuff Failure of the rotator cuff to heal  In compliance with recent [place] law in federal regulation regarding opioid use and abuse and addiction, we will taper (stop) opioid medication after 6 weeks.  If you're not comfortable with these risks and would like to continue with nonoperative treatment please let Dr. Harrison know prior to your surgery.  

## 2018-10-23 NOTE — Progress Notes (Signed)
FOLLOW UP VISIT : MRI RESULTS   Chief Complaint  Patient presents with  . Shoulder Pain    right   . Results    review MRI      HPI: The patient is here TO DISCUSS THE RESULTS OF MRI  73 year old female was injured back in November fell in her yard and injured her right shoulder after work-up nonoperative therapy she had an MRI which showed a torn rotator cuff.  She says she is in severe pain wishes to have surgery right away   Review of Systems  Musculoskeletal: Positive for neck pain.     BP (!) 150/88   Pulse 94   Ht 5\' 6"  (1.676 m)   Wt 174 lb (78.9 kg)   BMI 28.08 kg/m     Medical decision-making section   DATA  MRI REPORT:  Muscles: No atrophy. Slight edema deep to the infraspinatus is felt to be due to the joint effusion.   FINDINGS: Rotator cuff: There is a small irregular full-thickness tear of the distal supraspinatus tendon. There is extensive degeneration of the adjacent tendon. The remainder of the rotator cuff is intact.   Muscles: No atrophy. Slight edema deep to the infraspinatus is felt to be due to the joint effusion.   Biceps long head:  Properly located and intact.   Acromioclavicular Joint: Moderate AC joint hypertrophy with a slight type 3 acromion which could predispose to impingement. AC joint effusion. Fluid and debris in the distended subacromial and subdeltoid bursae.   Glenohumeral Joint: Prominent glenohumeral joint effusion with debris in the joint.   Labrum:  Intact.   Bones: Subtle edema in the posteromedial aspect of the humeral head which may represent a bone contusion. Slight degenerative changes of the posterior aspect of the greater tuberosity.   Other: None   IMPRESSION: 1. Small focal full-thickness tear of the supraspinatus tendon with degeneration of the adjacent tendon. 2. Prominent glenohumeral joint effusion extending into the subacromial/subdeltoid bursae with debris in the joint and bursae. 3. Probable  small bone contusion of the posteromedial aspect of the humeral head.     Electronically Signed   By: Francene Boyers M.D.   On: 10/11/2018 11:52      MY READING: MRI OF THE right shoulder MRI exhibits torn rotator cuff without atrophy joint effusion is noted   Encounter Diagnosis  Name Primary?  . Traumatic complete tear of right rotator cuff, subsequent encounter Yes    Plan right open rotator cuff repair with push locks and corkscrews Meds ordered this encounter  Medications  . HYDROcodone-acetaminophen (NORCO/VICODIN) 5-325 MG tablet    Sig: Take 1 tablet by mouth every 6 (six) hours as needed for moderate pain.    Dispense:  30 tablet    Refill:  0     The procedure has been fully reviewed with the patient; The risks and benefits of surgery have been discussed and explained and understood. Alternative treatment has also been reviewed, questions were encouraged and answered. The postoperative plan is also been reviewed.   PLAN:  Surgery : right open rotator cuff repair with push locks and corkscrews

## 2018-10-24 ENCOUNTER — Encounter (HOSPITAL_COMMUNITY): Payer: Self-pay

## 2018-10-24 ENCOUNTER — Other Ambulatory Visit: Payer: Self-pay

## 2018-10-24 ENCOUNTER — Encounter (HOSPITAL_COMMUNITY)
Admission: RE | Admit: 2018-10-24 | Discharge: 2018-10-24 | Disposition: A | Discharge status: Home / Self Care | Payer: Medicare Other | Source: Ambulatory Visit | Attending: Orthopedic Surgery | Admitting: Orthopedic Surgery

## 2018-10-24 DIAGNOSIS — Z01818 Encounter for other preprocedural examination: Secondary | ICD-10-CM | POA: Insufficient documentation

## 2018-10-24 HISTORY — DX: Unspecified hearing loss, unspecified ear: H91.90

## 2018-10-24 LAB — CBC WITH DIFFERENTIAL/PLATELET
Abs Immature Granulocytes: 0.02 10*3/uL (ref 0.00–0.07)
BASOS PCT: 0 %
Basophils Absolute: 0 10*3/uL (ref 0.0–0.1)
EOS ABS: 0.3 10*3/uL (ref 0.0–0.5)
Eosinophils Relative: 4 %
HEMATOCRIT: 40.3 % (ref 36.0–46.0)
Hemoglobin: 12.5 g/dL (ref 12.0–15.0)
IMMATURE GRANULOCYTES: 0 %
LYMPHS ABS: 2.1 10*3/uL (ref 0.7–4.0)
Lymphocytes Relative: 31 %
MCH: 24.8 pg — AB (ref 26.0–34.0)
MCHC: 31 g/dL (ref 30.0–36.0)
MCV: 80 fL (ref 80.0–100.0)
MONO ABS: 0.6 10*3/uL (ref 0.1–1.0)
MONOS PCT: 9 %
Neutro Abs: 3.7 10*3/uL (ref 1.7–7.7)
Neutrophils Relative %: 56 %
PLATELETS: 273 10*3/uL (ref 150–400)
RBC: 5.04 MIL/uL (ref 3.87–5.11)
RDW: 14.8 % (ref 11.5–15.5)
WBC: 6.8 10*3/uL (ref 4.0–10.5)
nRBC: 0 % (ref 0.0–0.2)

## 2018-10-24 LAB — BASIC METABOLIC PANEL
Anion gap: 7 (ref 5–15)
BUN: 14 mg/dL (ref 8–23)
CALCIUM: 9.6 mg/dL (ref 8.9–10.3)
CO2: 26 mmol/L (ref 22–32)
Chloride: 103 mmol/L (ref 98–111)
Creatinine, Ser: 1.58 mg/dL — ABNORMAL HIGH (ref 0.44–1.00)
GFR calc Af Amer: 38 mL/min — ABNORMAL LOW (ref >60)
GFR, EST NON AFRICAN AMERICAN: 33 mL/min — AB (ref >60)
Glucose, Bld: 86 mg/dL (ref 70–99)
Potassium: 4.2 mmol/L (ref 3.5–5.1)
SODIUM: 136 mmol/L (ref 135–145)

## 2018-10-24 LAB — HEMOGLOBIN A1C
Hgb A1c MFr Bld: 6.2 % — ABNORMAL HIGH (ref 4.8–5.6)
Mean Plasma Glucose: 131.24 mg/dL

## 2018-10-24 LAB — GLUCOSE, CAPILLARY: Glucose-Capillary: 85 mg/dL (ref 70–99)

## 2018-10-24 NOTE — Patient Instructions (Signed)
Beth Meza  10/24/2018     @PREFPERIOPPHARMACY @   Your procedure is scheduled on  10/29/2018.  Report to [facility] at  615  A.M.  Call this number if you have problems the morning of surgery:  [phone]   Remember:  Do not eat or drink after midnight.                        Take these medicines the morning of surgery with A SIP OF WATER  Hydrocodone ( if needed), lisinopril, zantac.   Do not wear jewelry, make-up or nail polish.  Do not wear lotions, powders, or perfumes, or deodorant.  Do not shave 48 hours prior to surgery.  Men may shave face and neck.  Do not bring valuables to the hospital.  [facility] is not responsible for any belongings or valuables.  Contacts, dentures or bridgework may not be worn into surgery.  Leave your suitcase in the car.  After surgery it may be brought to your room.  For patients admitted to the hospital, discharge time will be determined by your treatment team.  Patients discharged the day of surgery will not be allowed to drive home.   Name and phone number of your driver:   family Special instructions:  DO NOT take any medications for diabetes the morning of your procedure.  Please read over the following fact sheets that you were given. Anesthesia Post-op Instructions and Care and Recovery After Surgery       Rotator Cuff Tear  A rotator cuff tear is a partial or complete tear of the cord-like bands (tendons) that connect muscle to bone in the rotator cuff. The rotator cuff is a group of muscles and tendons that surround the shoulder joint and keep the upper arm bone (humerus) in the shoulder socket. The tear can occur suddenly (acute tear) or can develop over a long period of time (chronic tear). What are the causes? Acute tears may be caused by:  A fall, especially on an outstretched arm.  Lifting very heavy objects with a jerking motion. Chronic tears may be caused by overuse of the muscles. This may  happen in sports, physical work, or activities in which your arm repeatedly moves over your head. What increases the risk? This condition is more likely to occur in:  Athletes and workers who frequently use their shoulder or reach over their heads. This may include activities such as: ? Tennis. ? Baseball and softball. ? Swimming and rowing. ? Weightlifting. ? Holiday representative work. ? Painting.  People who smoke.  Older people who have arthritis or poor blood supply. These can make the muscles and tendons weaker. What are the signs or symptoms? Symptoms of this condition depend on the type and severity of the injury:  An acute tear may include a sudden tearing feeling, followed by severe pain that goes from your upper shoulder, down your arm, and toward your elbow.  A chronic tear includes a gradual weakness and decreased shoulder motion as the pain gets worse. The pain is usually worse at night. Both types may have symptoms such as:  Pain that spreads (radiates) from the shoulder to the upper arm.  Swelling and tenderness in front of the shoulder.  Decreased range of motion.  Pain when: ? Reaching, pulling, or lifting the arm above the head. ? Lowering the arm from above the head.  Not being able  to raise your arm out to the side.  Difficulty placing the arm behind your back. How is this diagnosed? This condition is diagnosed with a medical history and physical exam. Imaging tests may also be done, including:  X-rays.  MRI.  Ultrasound.  CT or MR arthrogram. During this test, a contrast material is injected into your shoulder and then images are taken. How is this treated? Treatment for this condition depends on the type and severity of the condition. In less severe cases, treatment may include:  Rest. This may be done with a sling that holds the shoulder still (immobilization). Your health care provider may also recommend avoiding activities that involve lifting your  arm over your head.  Icing the shoulder.  Anti-inflammatory medicines, such as aspirin or ibuprofen.  Strengthening and stretching exercises. Your health care provider may recommend specific exercises to improve your range of motion and strengthen your shoulder. In more severe cases, treatment may include:  Physical therapy.  Steroid injections.  Surgery. Follow these instructions at home: Managing pain, stiffness, and swelling  If directed, put ice on the injured area. ? If you have a removable sling, remove it as told by your health care provider. ? Put ice in a plastic bag. ? Place a towel between your skin and the bag. ? Leave the ice on for 20 minutes, 2-3 times a day.  Raise (elevate) the injured area above the level of your heart while you are lying down.  Find a comfortable sleeping position or sleep on a recliner, if available.  Move your fingers often to avoid stiffness and to lessen swelling.  Once the swelling has gone down, your health care provider may direct you to apply heat to relax the muscles. Use the heat source that your health care provider recommends, such as a moist heat pack or a heating pad. ? Place a towel between your skin and the heat source. ? Leave the heat on for 20-30 minutes. ? Remove the heat if your skin turns bright red. This is especially important if you are unable to feel pain, heat, or cold. You may have a greater risk of getting burned. If you have a sling:  Wear the sling as told by your health care provider. Remove it only as told by your health care provider.  Loosen the sling if your fingers tingle, become numb, or turn cold and blue.  Keep the sling clean.  If the sling is not waterproof: ? Do not let it get wet. ? Cover it with a watertight covering when you take a bath or a shower. Driving  Do not drive or use heavy machinery while taking prescription pain medicine.  Ask your health care provider when it is safe to drive  if you have a sling on your arm. Activity  Rest your shoulder as told by your health care provider.  Return to your normal activities as told by your health care provider. Ask your health care provider what activities are safe for you.  Do any exercises or stretches as told by your health care provider. General instructions  Do not use any products that contain nicotine or tobacco, such as cigarettes and e-cigarettes. If you need help quitting, ask your health care provider.  Take over-the-counter and prescription medicines only as told by your health care provider.  Keep all follow-up visits as told by your health care provider. This is important. Contact a health care provider if:  Your pain gets worse.  You  have new pain in your arm, hands, or fingers.  Medicine does not help your pain. Get help right away if:  Your arm, hand, or fingers are numb or tingling.  Your arm, hand, or fingers are swollen or painful or they turn white or blue.  Your hand or fingers on your injured arm are colder than your other hand. Summary  A rotator cuff tear is a partial or complete tear of the cord-like bands (tendons) that connect muscle to bone in the rotator cuff.  The tear can occur suddenly (acute tear) or can develop over a long period of time (chronic tear).  Treatment generally includes rest, anti-inflammatory medicines, and icing. In some cases, physical therapy and steroid injections may be needed. In severe cases, surgery may be needed. This information is not intended to replace advice given to you by your health care provider. Make sure you discuss any questions you have with your health care provider. Document Released: 09/22/2000 Document Revised: 12/11/2016 Document Reviewed: 12/11/2016 Elsevier Interactive Patient Education  2019 [employer]  Rotator Cuff Tear Rehab After Surgery Ask your health care provider which exercises are safe for you. Do exercises exactly as told  by your health care provider and adjust them as directed. It is normal to feel mild stretching, pulling, tightness, or discomfort as you do these exercises, but you should stop right away if you feel sudden pain or your pain gets worse. Do not begin these exercises until told by your health care provider. Stretching and range of motion exercises These exercises warm up your muscles and joints and improve the movement and flexibility of your shoulder. These exercises also help to relieve pain, numbness, and tingling. Exercise A: Pendulum  1. Stand near a wall or a surface that you can hold onto for balance. 2. Bend at the waist and let your left / right arm hang straight down. Use your other arm to keep your balance. 3. Relax your arm and shoulder muscles, and move your hips and your trunk so your left / right arm swings freely. Your arm should swing because of the motion of your body, not because you are using your arm or shoulder muscles. 4. Keep moving so your arm swings in the following directions, as told by your health care provider: ? Side to side. ? Forward and backward. ? In clockwise and counterclockwise circles. Repeat __________ times, or for __________ seconds per direction. Complete this exercise __________ times a day. Exercise B: Flexion, seated  1. Sit in a stable chair so your left / right forearm can rest on a flat surface. Your elbow should rest at a height that keeps your upper arm next to your body. 2. Keeping your shoulder relaxed, lean forward at the waist and let your hand slide forward. Stop when you feel a stretch in your shoulder, or when you reach the angle that is recommended by your health care provider. 3. Hold for __________ seconds. 4. Slowly return to the starting position. Repeat __________ times. Complete this exercise __________ times a day. Exercise C: Flexion, standing  1. Stand and hold a broomstick, a cane, or a similar object. Place your hands a little  more than shoulder-width apart on the object. Your left / right hand should be palm-up, and your other hand should be palm-down. 2. Push the stick down with your healthy arm to raise your left / right arm in front of your body, and then over your head. Use your other hand to  help move the stick. Stop when you feel a stretch in your shoulder, or when you reach the angle that is recommended by your health care provider. ? Avoid shrugging your shoulder while you raise your arm. Keep your shoulder blade tucked down toward your spine. ? Keep your left / right shoulder muscles relaxed. 3. Hold for __________ seconds. 4. Slowly return to the starting position. Repeat __________ times. Complete this exercise __________ times a day. Exercise D: Abduction, supine  1. Lie on your back and hold a broomstick, a cane, or a similar object. Place your hands a little more than shoulder-width apart on the object. Your left / right hand should be palm-up, and your other hand should be palm-down. 2. Push the stick to raise your left / right arm out to your side and then over your head. Use your other hand to help move the stick. Stop when you feel a stretch in your shoulder, or when you reach the angle that is recommended by your health care provider. ? Avoid shrugging your shoulder while you raise your arm. Keep your shoulder blade tucked down toward your spine. 3. Hold for __________ seconds. 4. Slowly return to the starting position. Repeat __________ times. Complete this exercise __________ times a day. Exercise E: Shoulder flexion, active-assisted  1. Lie on your back. You may bend your knees for comfort. 2. Hold a broomstick, a cane, or a similar object so your hands are about shoulder-width apart. Your palms should face toward your feet. 3. Raise your left / right arm over your head and behind your head, toward the floor. Use your other hand to help you do this. Stop when you feel a gentle stretch in your  shoulder, or when you reach the angle that is recommended by your health care provider. 4. Hold for __________ seconds. 5. Use the broomstick and your other arm to help you return your left / right arm to the starting position. Repeat __________ times. Complete this exercise __________ times a day. Exercise F: External rotation  1. Sit in a stable chair without armrests, or stand. 2. Tuck a soft object, such as a folded towel or a small ball, under your left / right upper arm. 3. Hold a broomstick, a cane, or a similar object so your palms face down, toward the floor. Bend your elbows to an "L" shape (90 degrees), and keep your hands about shoulder-width apart. 4. Straighten your healthy arm and push the broomstick across your body, toward your left / right side. Keep your left / right arm bent. This will rotate your left / right forearm away from your body. 5. Hold for __________ seconds. 6. Slowly return to the starting position. Repeat __________ times. Complete this exercise __________ times a day. Strengthening exercises These exercises build strength and endurance in your shoulder. Endurance is the ability to use your muscles for a long time, even after they get tired. Exercise G: Shoulder flexion, isometric  1. Stand or sit about 4-6 inches (10-15 cm) away from a wall with your left / right side facing the wall. 2. Gently make a fist and place your left / right hand on the wall so the top of your fist touches the wall. 3. With your left / right elbow straight, gently press the top of your fist into the wall. Gradually increase the pressure until you are pressing as hard as you can without shrugging your shoulder. 4. Hold for __________ seconds. 5. Slowly release the tension  and relax your muscles completely before you repeat the exercise. Repeat __________ times. Complete this exercise __________ times a day. Exercise H: Shoulder abduction, isometric  1. Stand or sit about 4-6 inches  (10-15 cm) away from a wall with your right/left side facing the wall. 2. Bend your left / right elbow and gently press your elbow into the wall as if you are trying to move your arm out to your side. Increase the pressure gradually until you are pressing as hard as you can without shrugging your shoulder. 3. Hold for __________ seconds. 4. Slowly release the tension and relax your muscles completely before repeating the exercise. Repeat __________ times. Complete this exercise __________ times a day. Exercise I: Internal rotation, isometric  1. Stand or sit in a doorway, facing the door frame. 2. Bend your left / right elbow and place the palm of your hand against the door frame. Only your palm should be touching the frame. Keep your upper arm at your side. 3. Gently press your hand into the door frame, as if you are trying to push your arm toward your abdomen. Do not let your wrist bend. ? Avoid shrugging your shoulder while you press your hand into the door frame. Keep your shoulder blade tucked down toward the middle of your back. 4. Hold for __________ seconds. 5. Slowly release the tension, and relax your muscles completely before you repeat the exercise. Repeat __________ times. Complete this exercise __________ times a day. Exercise J: External rotation, isometric  1. Stand or sit in a doorway, facing the door frame. 2. Bend your left / right elbow and place the back of your wrist against the door frame. Only the back of your wrist should be touching the frame. Keep your upper arm at your side. 3. Gently press your wrist against the door frame, as if you are trying to push your arm away from your abdomen. ? Avoid shrugging your shoulder while you press your wrist into the door frame. Keep your shoulder blade tucked down toward the middle of your back. 4. Hold for __________ seconds. 5. Slowly release the tension, and relax your muscles completely before you repeat the exercise. Repeat  __________ times. Complete this exercise __________ times a day. This information is not intended to replace advice given to you by your health care provider. Make sure you discuss any questions you have with your health care provider. Document Released: 09/25/2005 Document Revised: 06/01/2016 Document Reviewed: 10/09/2015 Elsevier Interactive Patient Education  2019 [employer]  General Anesthesia, Adult General anesthesia is the use of medicines to make a person "go to sleep" (unconscious) for a medical procedure. General anesthesia must be used for certain procedures, and is often recommended for procedures that:  Last a long time.  Require you to be still or in an unusual position.  Are major and can cause blood loss. The medicines used for general anesthesia are called general anesthetics. As well as making you unconscious for a certain amount of time, these medicines:  Prevent pain.  Control your blood pressure.  Relax your muscles. Tell a health care provider about:  Any allergies you have.  All medicines you are taking, including vitamins, herbs, eye drops, creams, and over-the-counter medicines.  Any problems you or family members have had with anesthetic medicines.  Types of anesthetics you have had in the past.  Any blood disorders you have.  Any surgeries you have had.  Any medical conditions you have.  Any recent upper  respiratory, chest, or ear infections.  Any history of: ? Heart or lung conditions, such as heart failure, sleep apnea, asthma, or chronic obstructive pulmonary disease (COPD). ? Financial planner. ? Depression or anxiety.  Any tobacco or drug use, including marijuana or alcohol use.  Whether you are pregnant or may be pregnant. What are the risks? Generally, this is a safe procedure. However, problems may occur, including:  Allergic reaction.  Lung and heart problems.  Inhaling food or liquid from the stomach into the lungs  (aspiration).  Nerve injury.  Dental injury.  Air in the bloodstream, which can lead to stroke.  Extreme agitation or confusion (delirium) when you wake up from the anesthetic.  Waking up during your procedure and being unable to move. This is rare. These problems are more likely to develop if you are having a major surgery or if you have an advanced or serious medical condition. You can prevent some of these complications by answering all of your health care provider's questions thoroughly and by following all instructions before your procedure. General anesthesia can cause side effects, including:  Nausea or vomiting.  A sore throat from the breathing tube.  Hoarseness.  Wheezing or coughing.  Shaking chills.  Tiredness.  Body aches.  Anxiety.  Sleepiness or drowsiness.  Confusion or agitation. What happens before the procedure? Staying hydrated Follow instructions from your health care provider about hydration, which may include:  Up to 2 hours before the procedure - you may continue to drink clear liquids, such as water, clear fruit juice, black coffee, and plain tea.  Eating and drinking restrictions Follow instructions from your health care provider about eating and drinking, which may include:  8 hours before the procedure - stop eating heavy meals or foods such as meat, fried foods, or fatty foods.  6 hours before the procedure - stop eating light meals or foods, such as toast or cereal.  6 hours before the procedure - stop drinking milk or drinks that contain milk.  2 hours before the procedure - stop drinking clear liquids. Medicines Ask your health care provider about:  Changing or stopping your regular medicines. This is especially important if you are taking diabetes medicines or blood thinners.  Taking medicines such as aspirin and ibuprofen. These medicines can thin your blood. Do not take these medicines unless your health care provider tells you  to take them.  Taking over-the-counter medicines, vitamins, herbs, and supplements. Do not take these during the week before your procedure unless your health care provider approves them. General instructions  Starting 3-6 weeks before the procedure, do not use any products that contain nicotine or tobacco, such as cigarettes and e-cigarettes. If you need help quitting, ask your health care provider.  If you brush your teeth on the morning of the procedure, make sure to spit out all of the toothpaste.  Tell your health care provider if you become ill or develop a cold, cough, or fever.  If instructed by your health care provider, bring your sleep apnea device with you on the day of your surgery (if applicable).  Ask your health care provider if you will be going home the same day, the following day, or after a longer hospital stay. ? Plan to have someone take you home from the hospital or clinic. ? Plan to have a responsible adult care for you for at least 24 hours after you leave the hospital or clinic. This is important. What happens during the procedure?  You will be given anesthetics through both of the following: ? A mask placed over your nose and mouth. ? An IV in one of your veins.  You may receive a medicine to help you relax (sedative).  After you are unconscious, a breathing tube may be inserted down your throat to help you breathe. This will be removed before you wake up.  An anesthesia specialist will stay with you throughout your procedure. He or she will: ? Keep you comfortable and safe by continuing to give you medicines and adjusting the amount of medicine that you get. ? Monitor your blood pressure, pulse, and oxygen levels to make sure that the anesthetics do not cause any problems. The procedure may vary among health care providers and hospitals. What happens after the procedure?  Your blood pressure, temperature, heart rate, breathing rate, and blood oxygen level  will be monitored until the medicines you were given have worn off.  You will wake up in a recovery area. You may wake up slowly.  If you feel anxious or agitated, you may be given medicine to help you calm down.  If you will be going home the same day, your health care provider may check to make sure you can walk, drink, and urinate.  Your health care provider will treat any pain or side effects you have before you go home.  Do not drive for 24 hours if you were given a sedative. Summary  General anesthesia is used to keep you still and prevent pain during a procedure.  It is important to tell your health care provider about your medical history and any surgeries you have had, and previous experience with anesthesia.  Follow your health care provider's instructions about when to stop eating, drinking, or taking certain medicines before your procedure.  Plan to have someone take you home from the hospital or clinic. This information is not intended to replace advice given to you by your health care provider. Make sure you discuss any questions you have with your health care provider. Document Released: 01/02/2008 Document Revised: 02/12/2018 Document Reviewed: 05/11/2017 Elsevier Interactive Patient Education  2019 [employer] General Anesthesia, Adult, Care After This sheet gives you information about how to care for yourself after your procedure. Your health care provider may also give you more specific instructions. If you have problems or questions, contact your health care provider. What can I expect after the procedure? After the procedure, the following side effects are common:  Pain or discomfort at the IV site.  Nausea.  Vomiting.  Sore throat.  Trouble concentrating.  Feeling cold or chills.  Weak or tired.  Sleepiness and fatigue.  Soreness and body aches. These side effects can affect parts of the body that were not involved in surgery. Follow these  instructions at home:  For at least 24 hours after the procedure:  Have a responsible adult stay with you. It is important to have someone help care for you until you are awake and alert.  Rest as needed.  Do not: ? Participate in activities in which you could fall or become injured. ? Drive. ? Use heavy machinery. ? Drink alcohol. ? Take sleeping pills or medicines that cause drowsiness. ? Make important decisions or sign legal documents. ? Take care of children on your own. Eating and drinking  Follow any instructions from your health care provider about eating or drinking restrictions.  When you feel hungry, start by eating small amounts of foods that are soft  and easy to digest (bland), such as toast. Gradually return to your regular diet.  Drink enough fluid to keep your urine pale yellow.  If you vomit, rehydrate by drinking water, juice, or clear broth. General instructions  If you have sleep apnea, surgery and certain medicines can increase your risk for breathing problems. Follow instructions from your health care provider about wearing your sleep device: ? Anytime you are sleeping, including during daytime naps. ? While taking prescription pain medicines, sleeping medicines, or medicines that make you drowsy.  Return to your normal activities as told by your health care provider. Ask your health care provider what activities are safe for you.  Take over-the-counter and prescription medicines only as told by your health care provider.  If you smoke, do not smoke without supervision.  Keep all follow-up visits as told by your health care provider. This is important. Contact a health care provider if:  You have nausea or vomiting that does not get better with medicine.  You cannot eat or drink without vomiting.  You have pain that does not get better with medicine.  You are unable to pass urine.  You develop a skin rash.  You have a fever.  You have redness  around your IV site that gets worse. Get help right away if:  You have difficulty breathing.  You have chest pain.  You have blood in your urine or stool, or you vomit blood. Summary  After the procedure, it is common to have a sore throat or nausea. It is also common to feel tired.  Have a responsible adult stay with you for the first 24 hours after general anesthesia. It is important to have someone help care for you until you are awake and alert.  When you feel hungry, start by eating small amounts of foods that are soft and easy to digest (bland), such as toast. Gradually return to your regular diet.  Drink enough fluid to keep your urine pale yellow.  Return to your normal activities as told by your health care provider. Ask your health care provider what activities are safe for you. This information is not intended to replace advice given to you by your health care provider. Make sure you discuss any questions you have with your health care provider. Document Released: 01/01/2001 Document Revised: 05/11/2017 Document Reviewed: 05/11/2017 Elsevier Interactive Patient Education  2019 [employer].

## 2018-10-29 ENCOUNTER — Encounter (HOSPITAL_COMMUNITY)
Admission: RE | Disposition: A | Discharge status: Home / Self Care | Payer: Self-pay | Source: Ambulatory Visit | Attending: Orthopedic Surgery

## 2018-10-29 ENCOUNTER — Encounter (HOSPITAL_COMMUNITY): Payer: Self-pay | Admitting: Emergency Medicine

## 2018-10-29 ENCOUNTER — Ambulatory Visit (HOSPITAL_COMMUNITY)
Admission: RE | Admit: 2018-10-29 | Discharge: 2018-10-29 | Disposition: A | Discharge status: Home / Self Care | Payer: Medicare Other | Source: Ambulatory Visit | Attending: Orthopedic Surgery | Admitting: Orthopedic Surgery

## 2018-10-29 ENCOUNTER — Ambulatory Visit (HOSPITAL_COMMUNITY): Payer: Medicare Other | Admitting: Anesthesiology

## 2018-10-29 ENCOUNTER — Other Ambulatory Visit: Payer: Self-pay

## 2018-10-29 DIAGNOSIS — M75101 Unspecified rotator cuff tear or rupture of right shoulder, not specified as traumatic: Secondary | ICD-10-CM

## 2018-10-29 DIAGNOSIS — W19XXXA Unspecified fall, initial encounter: Secondary | ICD-10-CM | POA: Insufficient documentation

## 2018-10-29 DIAGNOSIS — S46011A Strain of muscle(s) and tendon(s) of the rotator cuff of right shoulder, initial encounter: Secondary | ICD-10-CM | POA: Diagnosis present

## 2018-10-29 DIAGNOSIS — M12811 Other specific arthropathies, not elsewhere classified, right shoulder: Secondary | ICD-10-CM

## 2018-10-29 DIAGNOSIS — Z8249 Family history of ischemic heart disease and other diseases of the circulatory system: Secondary | ICD-10-CM | POA: Insufficient documentation

## 2018-10-29 DIAGNOSIS — Z9889 Other specified postprocedural states: Secondary | ICD-10-CM

## 2018-10-29 DIAGNOSIS — I10 Essential (primary) hypertension: Secondary | ICD-10-CM | POA: Insufficient documentation

## 2018-10-29 DIAGNOSIS — E119 Type 2 diabetes mellitus without complications: Secondary | ICD-10-CM | POA: Insufficient documentation

## 2018-10-29 HISTORY — PX: SHOULDER OPEN ROTATOR CUFF REPAIR: SHX2407

## 2018-10-29 LAB — GLUCOSE, CAPILLARY
GLUCOSE-CAPILLARY: 105 mg/dL — AB (ref 70–99)
Glucose-Capillary: 100 mg/dL — ABNORMAL HIGH (ref 70–99)

## 2018-10-29 SURGERY — REPAIR, ROTATOR CUFF, OPEN
Anesthesia: General | Site: Shoulder | Laterality: Right

## 2018-10-29 MED ORDER — MIDAZOLAM HCL 2 MG/2ML IJ SOLN
INTRAMUSCULAR | Status: AC
  Filled 2018-10-29: qty 2

## 2018-10-29 MED ORDER — ONDANSETRON HCL 4 MG/2ML IJ SOLN
INTRAMUSCULAR | Status: AC
  Filled 2018-10-29: qty 2

## 2018-10-29 MED ORDER — BUPIVACAINE-EPINEPHRINE (PF) 0.25% -1:200000 IJ SOLN
INTRAMUSCULAR | Status: AC
  Filled 2018-10-29: qty 60

## 2018-10-29 MED ORDER — SUGAMMADEX SODIUM 200 MG/2ML IV SOLN
INTRAVENOUS | Status: DC | PRN
  Administered 2018-10-29: 200 mg via INTRAVENOUS

## 2018-10-29 MED ORDER — KETOROLAC TROMETHAMINE 30 MG/ML IJ SOLN
30.0000 mg | Freq: Once | INTRAMUSCULAR | Status: AC | PRN
  Administered 2018-10-29: 30 mg via INTRAVENOUS
  Filled 2018-10-29: qty 1

## 2018-10-29 MED ORDER — ONDANSETRON HCL 4 MG/2ML IJ SOLN: 4.0000 mg | Freq: Once | INTRAMUSCULAR | Status: DC | PRN

## 2018-10-29 MED ORDER — EPHEDRINE SULFATE 50 MG/ML IJ SOLN
INTRAMUSCULAR | Status: DC | PRN
  Administered 2018-10-29: 20 mg via INTRAVENOUS

## 2018-10-29 MED ORDER — FENTANYL CITRATE (PF) 100 MCG/2ML IJ SOLN
INTRAMUSCULAR | Status: DC | PRN
  Administered 2018-10-29: 50 ug via INTRAVENOUS

## 2018-10-29 MED ORDER — 0.9 % SODIUM CHLORIDE (POUR BTL) OPTIME
TOPICAL | Status: DC | PRN
  Administered 2018-10-29: 1000 mL

## 2018-10-29 MED ORDER — PHENYLEPHRINE HCL 10 MG/ML IJ SOLN
INTRAMUSCULAR | Status: DC | PRN
  Administered 2018-10-29: 40 ug via INTRAVENOUS
  Administered 2018-10-29 (×2): 80 ug via INTRAVENOUS
  Administered 2018-10-29: 40 ug via INTRAVENOUS
  Administered 2018-10-29: 80 ug via INTRAVENOUS

## 2018-10-29 MED ORDER — HYDROCODONE-ACETAMINOPHEN 7.5-325 MG PO TABS
1.0000 | ORAL_TABLET | Freq: Once | ORAL | Status: AC | PRN
  Administered 2018-10-29: 1 via ORAL
  Filled 2018-10-29: qty 1

## 2018-10-29 MED ORDER — CEFAZOLIN SODIUM-DEXTROSE 2-4 GM/100ML-% IV SOLN
INTRAVENOUS | Status: AC
  Filled 2018-10-29: qty 100

## 2018-10-29 MED ORDER — PROPOFOL 10 MG/ML IV BOLUS
INTRAVENOUS | Status: DC | PRN
  Administered 2018-10-29: 120 mg via INTRAVENOUS

## 2018-10-29 MED ORDER — MEPERIDINE HCL 50 MG/ML IJ SOLN: 6.2500 mg | INTRAMUSCULAR | Status: DC | PRN

## 2018-10-29 MED ORDER — CHLORHEXIDINE GLUCONATE 4 % EX LIQD: 60.0000 mL | Freq: Once | CUTANEOUS | Status: DC

## 2018-10-29 MED ORDER — HYDROMORPHONE HCL 1 MG/ML IJ SOLN: 0.2500 mg | INTRAMUSCULAR | Status: DC | PRN

## 2018-10-29 MED ORDER — FENTANYL CITRATE (PF) 250 MCG/5ML IJ SOLN
INTRAMUSCULAR | Status: AC
  Filled 2018-10-29: qty 5

## 2018-10-29 MED ORDER — SUCCINYLCHOLINE CHLORIDE 20 MG/ML IJ SOLN
INTRAMUSCULAR | Status: DC | PRN
  Administered 2018-10-29: 120 mg via INTRAVENOUS

## 2018-10-29 MED ORDER — SUGAMMADEX SODIUM 200 MG/2ML IV SOLN
INTRAVENOUS | Status: AC
  Filled 2018-10-29: qty 2

## 2018-10-29 MED ORDER — DEXAMETHASONE SODIUM PHOSPHATE 4 MG/ML IJ SOLN
INTRAMUSCULAR | Status: AC
  Filled 2018-10-29: qty 2

## 2018-10-29 MED ORDER — ROPIVACAINE HCL 5 MG/ML IJ SOLN
INTRAMUSCULAR | Status: AC
  Filled 2018-10-29: qty 30

## 2018-10-29 MED ORDER — PROMETHAZINE HCL 12.5 MG PO TABS: 12.5000 mg | ORAL_TABLET | Freq: Four times a day (QID) | ORAL | 0 refills | Status: DC | PRN

## 2018-10-29 MED ORDER — MIDAZOLAM HCL 5 MG/5ML IJ SOLN
INTRAMUSCULAR | Status: DC | PRN
  Administered 2018-10-29: 3 mg via INTRAVENOUS
  Administered 2018-10-29: 1 mg via INTRAVENOUS

## 2018-10-29 MED ORDER — DEXAMETHASONE SODIUM PHOSPHATE 4 MG/ML IJ SOLN
INTRAMUSCULAR | Status: DC | PRN
  Administered 2018-10-29: 8 mg via INTRAVENOUS

## 2018-10-29 MED ORDER — ROCURONIUM BROMIDE 100 MG/10ML IV SOLN
INTRAVENOUS | Status: DC | PRN
  Administered 2018-10-29: 20 mg via INTRAVENOUS

## 2018-10-29 MED ORDER — ROCURONIUM BROMIDE 10 MG/ML (PF) SYRINGE
PREFILLED_SYRINGE | INTRAVENOUS | Status: AC
  Filled 2018-10-29: qty 10

## 2018-10-29 MED ORDER — LACTATED RINGERS IV SOLN
INTRAVENOUS | Status: DC | PRN
  Administered 2018-10-29: 07:00:00 via INTRAVENOUS
  Administered 2018-10-29: 1000 mL

## 2018-10-29 MED ORDER — PHENYLEPHRINE 40 MCG/ML (10ML) SYRINGE FOR IV PUSH (FOR BLOOD PRESSURE SUPPORT)
PREFILLED_SYRINGE | INTRAVENOUS | Status: AC
  Filled 2018-10-29: qty 10

## 2018-10-29 MED ORDER — PROPOFOL 10 MG/ML IV BOLUS
INTRAVENOUS | Status: AC
  Filled 2018-10-29: qty 20

## 2018-10-29 MED ORDER — ONDANSETRON HCL 4 MG/2ML IJ SOLN
INTRAMUSCULAR | Status: DC | PRN
  Administered 2018-10-29: 4 mg via INTRAVENOUS

## 2018-10-29 MED ORDER — HYDROCODONE-ACETAMINOPHEN 7.5-325 MG PO TABS: 1.0000 | ORAL_TABLET | ORAL | 0 refills | Status: DC | PRN

## 2018-10-29 MED ORDER — CEFAZOLIN SODIUM-DEXTROSE 2-4 GM/100ML-% IV SOLN
2.0000 g | INTRAVENOUS | Status: AC
  Administered 2018-10-29: 2 g via INTRAVENOUS

## 2018-10-29 MED ORDER — LACTATED RINGERS IV SOLN: INTRAVENOUS | Status: DC

## 2018-10-29 MED ORDER — SUCCINYLCHOLINE CHLORIDE 200 MG/10ML IV SOSY
PREFILLED_SYRINGE | INTRAVENOUS | Status: AC
  Filled 2018-10-29: qty 10

## 2018-10-29 MED ORDER — EPHEDRINE 5 MG/ML INJ
INTRAVENOUS | Status: AC
  Filled 2018-10-29: qty 10

## 2018-10-29 MED ORDER — BUPIVACAINE-EPINEPHRINE (PF) 0.25% -1:200000 IJ SOLN
INTRAMUSCULAR | Status: DC | PRN
  Administered 2018-10-29: 30 mL

## 2018-10-29 SURGICAL SUPPLY — 44 items
ANCHOR SUT CORKSCREW 5X15.5 (Anchor) ×2 IMPLANT
BENZOIN TINCTURE PRP APPL 2/3 (GAUZE/BANDAGES/DRESSINGS) ×2 IMPLANT
BLADE HEX COATED 2.75 (ELECTRODE) ×2 IMPLANT
BLADE OSC/SAGITTAL MD 9X18.5 (BLADE) ×2 IMPLANT
CHLORAPREP W/TINT 26ML (MISCELLANEOUS) ×2 IMPLANT
CLOTH BEACON ORANGE TIMEOUT ST (SAFETY) ×2 IMPLANT
COVER LIGHT HANDLE STERIS (MISCELLANEOUS) ×4 IMPLANT
COVER WAND RF STERILE (DRAPES) ×2 IMPLANT
DECANTER SPIKE VIAL GLASS SM (MISCELLANEOUS) ×4 IMPLANT
DRAPE ORTHO 2.5IN SPLIT 77X108 (DRAPES) ×2 IMPLANT
DRAPE ORTHO SPLIT 77X108 STRL (DRAPES) ×2
DRAPE PROXIMA HALF (DRAPES) ×2 IMPLANT
DRESSING ALLEVYN BORDER 5X5 (GAUZE/BANDAGES/DRESSINGS) ×2 IMPLANT
ELECT REM PT RETURN 9FT ADLT (ELECTROSURGICAL) ×2
ELECTRODE REM PT RTRN 9FT ADLT (ELECTROSURGICAL) ×1 IMPLANT
GLOVE BIO SURGEON STRL SZ7 (GLOVE) ×2 IMPLANT
GLOVE BIOGEL PI IND STRL 7.0 (GLOVE) ×2 IMPLANT
GLOVE BIOGEL PI INDICATOR 7.0 (GLOVE) ×2
GLOVE SKINSENSE NS SZ8.0 LF (GLOVE) ×2
GLOVE SKINSENSE STRL SZ8.0 LF (GLOVE) ×2 IMPLANT
GLOVE SS N UNI LF 8.5 STRL (GLOVE) ×2 IMPLANT
GOWN STRL REUS W/TWL LRG LVL3 (GOWN DISPOSABLE) ×4 IMPLANT
GOWN STRL REUS W/TWL XL LVL3 (GOWN DISPOSABLE) ×2 IMPLANT
INST SET MINOR BONE (KITS) ×2 IMPLANT
KIT BLADEGUARD II DBL (SET/KITS/TRAYS/PACK) ×2 IMPLANT
KIT TURNOVER KIT A (KITS) ×2 IMPLANT
MANIFOLD NEPTUNE II (INSTRUMENTS) ×2 IMPLANT
MARKER SKIN DUAL TIP RULER LAB (MISCELLANEOUS) ×2 IMPLANT
NEEDLE HYPO 21X1.5 SAFETY (NEEDLE) ×2 IMPLANT
NS IRRIG 1000ML POUR BTL (IV SOLUTION) ×2 IMPLANT
PACK TOTAL JOINT (CUSTOM PROCEDURE TRAY) ×2 IMPLANT
PAD ARMBOARD 7.5X6 YLW CONV (MISCELLANEOUS) ×2 IMPLANT
PASSER SUT SWANSON 36MM LOOP (INSTRUMENTS) ×2 IMPLANT
RASP SM TEAR CROSS CUT (RASP) ×2 IMPLANT
SET BASIN LINEN APH (SET/KITS/TRAYS/PACK) ×2 IMPLANT
SLING ARM IMMOBILIZER MED (SOFTGOODS) ×2 IMPLANT
STAPLER VISISTAT 35W (STAPLE) ×2 IMPLANT
STRIP CLOSURE SKIN 1/2X4 (GAUZE/BANDAGES/DRESSINGS) ×2 IMPLANT
SUT ETHIBOND NAB OS 4 #2 30IN (SUTURE) ×2 IMPLANT
SUT MON AB 0 CT1 (SUTURE) ×2 IMPLANT
SUT MON AB 2-0 CT1 36 (SUTURE) ×4 IMPLANT
SYR 30ML LL (SYRINGE) ×2 IMPLANT
SYR BULB IRRIGATION 50ML (SYRINGE) ×4 IMPLANT
YANKAUER SUCT 12FT TUBE ARGYLE (SUCTIONS) ×2 IMPLANT

## 2018-10-29 NOTE — Op Note (Signed)
10/29/2018  9:03 AM  PATIENT:  Beth Meza  72 y.o. female  PRE-OPERATIVE DIAGNOSIS:  right shoulder rotator cuff tear  POST-OPERATIVE DIAGNOSIS:  right shoulder rotator cuff tear  PROCEDURE:  Procedure(s): OPEN ROTATOR CUFF REPAIR WITH ACHROMIOPLASTY (Right)-23410  SURGEON:  Surgeon(s) and Role:    * ,  E, MD - Primary  Details of surgery:  Patient was seen in the preop area identity was confirmed.  Surgical site was confirmed marked chart review was completed.  The patient was taken to the operating room where a paracervical block was administered by anesthesia without complication.  Patient then underwent general anesthesia with intubation.  She was then placed in the modified beachchair position followed by sterile prep and drape.  A timeout was then completed surgical site was confirmed operative films were pin in place   The subcutaneous tissue was injected over the right acromion and deltoid the incision was made over the anterolateral corner of the acromion extended distally.  Subcutaneous tissue was divided medial lateral flaps were created the deltoid fascia was identified and split up to the acromion and then extended in a subperiosteal manner laterally and anteriorly exposing the acromion.  Rotator cuff tear was identified as a small 1 cm tear no retraction and there was also abrasion of the supraspinatus and infraspinatus which was debrided.  Acromioplasty was performed and then a rasp was used to contour the acromion.  1 corkscrew 5.5 anchor was placed and the 2 sutures for the graft from the corkscrew was used to repair the rotator cuff.  We obtained a water tight sugar.  The wound was irrigated.  The deltoid fascia was closed with interrupted #2 Ethibond suture followed by subcutaneous closure with 0 Monocryl and 2-0 Monocryl.  Benzoin and Steri-Strips were used to reapproximate the skin edges  The subacromial space was injected with 50 cc of  Marcaine with epinephrine  A sterile dressing and sling were applied the patient was extubated taken to recovery room in stable condition  PHYSICIAN ASSISTANT:   ASSISTANTS: betty ashley   ANESTHESIA:   general and paracervical block  EBL:  15 mL   BLOOD ADMINISTERED:none  DRAINS: none   LOCAL MEDICATIONS USED:  MARCAINE     SPECIMEN:  No Specimen  DISPOSITION OF SPECIMEN:  N/A  COUNTS:  YES  TOURNIQUET:  * No tourniquets in log *  DICTATION: .Dragon Dictation  PLAN OF CARE: Discharge to home after PACU  PATIENT DISPOSITION:  PACU - hemodynamically stable.   Delay start of Pharmacological VTE agent (>24hrs) due to surgical blood loss or risk of bleeding: not applicable  

## 2018-10-29 NOTE — Interval H&P Note (Signed)
History and Physical Interval Note:  10/29/2018 7:23 AM  Beth Meza  has presented today for surgery, with the diagnosis of right shoulder rotator cuff repair  The various methods of treatment have been discussed with the patient and family. After consideration of risks, benefits and other options for treatment, the patient has consented to  Procedure(s): ROTATOR CUFF REPAIR SHOULDER OPEN (Right) as a surgical intervention .  The patient's history has been reviewed, patient examined, no change in status, stable for surgery.  I have reviewed the patient's chart and labs.  Questions were answered to the patient's satisfaction.     Fuller Canada

## 2018-10-29 NOTE — Anesthesia Postprocedure Evaluation (Signed)
Anesthesia Post Note  Patient: Beth Meza  Procedure(s) Performed: OPEN ROTATOR CUFF REPAIR WITH ACHROMIOPLASTY (Right Shoulder)  Patient location during evaluation: PACU Anesthesia Type: General Level of consciousness: awake and alert and patient cooperative Pain management: pain level controlled Vital Signs Assessment: post-procedure vital signs reviewed and stable Respiratory status: spontaneous breathing, nonlabored ventilation and respiratory function stable Cardiovascular status: blood pressure returned to baseline Postop Assessment: no apparent nausea or vomiting Anesthetic complications: no     Last Vitals:  Vitals:   10/29/18 0915 10/29/18 0930  BP: 129/68 116/75  Pulse: 91 89  Resp: 10 15  Temp:    SpO2: 100% 100%    Last Pain:  Vitals:   10/29/18 0930  TempSrc:   PainSc: 0-No pain                 , J

## 2018-10-29 NOTE — Anesthesia Procedure Notes (Signed)
Procedure Name: Intubation Date/Time: 10/29/2018 7:48 AM Performed by: Charmaine Downs, CRNA Pre-anesthesia Checklist: Patient identified, Patient being monitored, Timeout performed, Emergency Drugs available and Suction available Patient Re-evaluated:Patient Re-evaluated prior to induction Oxygen Delivery Method: Circle System Utilized Preoxygenation: Pre-oxygenation with 100% oxygen Induction Type: IV induction Ventilation: Mask ventilation without difficulty Laryngoscope Size: Mac and 4 Grade View: Grade II Tube type: Oral Tube size: 7.0 mm Number of attempts: 1 Airway Equipment and Method: stylet Placement Confirmation: ETT inserted through vocal cords under direct vision,  positive ETCO2 and breath sounds checked- equal and bilateral Secured at: 23 cm Tube secured with: Tape Dental Injury: Teeth and Oropharynx as per pre-operative assessment

## 2018-10-29 NOTE — Anesthesia Preprocedure Evaluation (Signed)
Anesthesia Evaluation  Patient identified by MRN, date of birth, ID band Patient awake    Reviewed: Allergy & Precautions, H&P , NPO status , Patient's Chart, lab work & pertinent test results, reviewed documented beta blocker date and time   Airway Mallampati: II  TM Distance: >3 FB Neck ROM: full    Dental no notable dental hx.    Pulmonary neg pulmonary ROS,    Pulmonary exam normal breath sounds clear to auscultation       Cardiovascular Exercise Tolerance: Good hypertension, negative cardio ROS   Rhythm:regular Rate:Normal     Neuro/Psych  Headaches, Seizures -,  negative psych ROS   GI/Hepatic Neg liver ROS, GERD  ,  Endo/Other  negative endocrine ROSdiabetes  Renal/GU negative Renal ROS  negative genitourinary   Musculoskeletal   Abdominal   Peds  Hematology  (+) Blood dyscrasia, anemia ,   Anesthesia Other Findings   Reproductive/Obstetrics negative OB ROS                             Anesthesia Physical Anesthesia Plan  ASA: II  Anesthesia Plan: General   Post-op Pain Management: GA combined w/ Regional for post-op pain   Induction:   PONV Risk Score and Plan:   Airway Management Planned:   Additional Equipment:   Intra-op Plan:   Post-operative Plan:   Informed Consent: I have reviewed the patients History and Physical, chart, labs and discussed the procedure including the risks, benefits and alternatives for the proposed anesthesia with the patient or authorized representative who has indicated his/her understanding and acceptance.     Dental Advisory Given  Plan Discussed with: CRNA  Anesthesia Plan Comments:         Anesthesia Quick Evaluation

## 2018-10-29 NOTE — H&P (Signed)
Chief Complaint  Patient presents with  . Shoulder Pain      right   . Results      review MRI         HPI: The patient is here TO DISCUSS THE RESULTS OF MRI   73 year old female was injured back in November fell in her yard and injured her right shoulder after work-up nonoperative therapy she had an MRI which showed a torn rotator cuff.  She says she is in severe pain wishes to have surgery right away     Review of Systems  Musculoskeletal: Positive for neck pain.   Past Medical History:  Diagnosis Date  . Anemia   . Back spasm   . Degenerative arthritis   . Diabetes (HCC)    diet controlled  . Dizziness and giddiness 03/07/2013  . Dyslipidemia   . Gastroesophageal reflux disease   . Hand cramps   . Headache(784.0) 03/07/2013  . History of carpal tunnel syndrome    Bilateral  . HOH (hard of hearing)   . Hypertension   . Leg cramps   . Memory deficit 02/25/2014  . Seizures (HCC)    as child- unknown etiology-no meds   Past Surgical History:  Procedure Laterality Date  . ANKLE SURGERY Left    History of ankle fracture  . BACK SURGERY    . COLONOSCOPY N/A 05/04/2014   Procedure: COLONOSCOPY;  Surgeon: West Bali, MD;  Location: [facility];  Service: Endoscopy;  Laterality: N/A;  11:30  . HAND SURGERY     Carpal tunnel syndrome, trigger fingers bilaterally  . KNEE SURGERY Left    Pin, history of fracture  . LUMBAR LAMINECTOMY/DECOMPRESSION MICRODISCECTOMY N/A 10/29/2017   Procedure: LUMBAR LAMINECTOMY AND FORAMINOTOMY LUMBAR THREE- LUMBAR FOUR, LUMBAR FOUR- LUMBAR FIVE;  Surgeon: Tressie Stalker, MD;  [facility];  Service: Neurosurgery;  Laterality: N/A;  . plate in let leg    . SHOULDER ACROMIOPLASTY Left 06/10/2014   Procedure: SHOULDER ACROMIOPLASTY;  Surgeon: Vickki Hearing, MD;  Location: [facility];  Service: Orthopedics;  Laterality: Left;  . SHOULDER OPEN ROTATOR CUFF REPAIR Left 04/08/2014   Dr. Romeo Apple  . SHOULDER OPEN ROTATOR CUFF REPAIR Left  06/10/2014   Procedure: REVISION ROTATOR CUFF REPAIR SHOULDER OPEN WITH GRAFT;  Surgeon: Vickki Hearing, MD;  Location: [facility];  Service: Orthopedics;  Laterality: Left;  . TUBAL LIGATION      Social History   Tobacco Use  . Smoking status: Never Smoker  . Smokeless tobacco: Never Used  Substance Use Topics  . Alcohol use: No  . Drug use: No    Family History  Problem Relation Age of Onset  . Heart attack Father   . Colon cancer Neg Hx            BP (!) 150/88   Pulse 94   Ht 5\' 6"  (1.676 m)   Wt 174 lb (78.9 kg)   BMI 28.08 kg/m    Physical Exam Vitals signs and nursing note reviewed.  Constitutional:      General: She is not in acute distress.    Appearance: She is well-developed. She is not ill-appearing, toxic-appearing or diaphoretic.  HENT:     Head: Normocephalic and atraumatic.     Right Ear: External ear normal.     Left Ear: External ear normal.     Nose: Nose normal.     Mouth/Throat:     Mouth: Mucous membranes are moist.  Pharynx: No oropharyngeal exudate.  Eyes:     General: No scleral icterus.       Right eye: No discharge.        Left eye: No discharge.     Extraocular Movements: Extraocular movements intact.     Conjunctiva/sclera: Conjunctivae normal.     Pupils: Pupils are equal, round, and reactive to light.  Neck:     Musculoskeletal: Normal range of motion and neck supple.     Thyroid: No thyromegaly.     Vascular: No JVD.     Trachea: No tracheal deviation.  Cardiovascular:     Rate and Rhythm: Normal rate.     Chest Wall: PMI is not displaced.     Pulses: Normal pulses.  Pulmonary:     Effort: Pulmonary effort is normal. No respiratory distress.     Breath sounds: No stridor. No wheezing.  Abdominal:     General: Bowel sounds are normal. There is no distension.     Palpations: Abdomen is soft. There is no mass.     Tenderness: There is no abdominal tenderness. There is no rebound.  Musculoskeletal:     Right shoulder:  She exhibits decreased range of motion, tenderness and pain. She exhibits no bony tenderness, no swelling, no effusion, no crepitus, no deformity, no laceration, normal pulse and normal strength.       Arms:     Right lower leg: No edema.     Left lower leg: No edema.  Lymphadenopathy:     Cervical: No cervical adenopathy.     Lower Body: No right inguinal adenopathy. No left inguinal adenopathy.  Skin:    General: Skin is warm and dry.     Capillary Refill: Capillary refill takes less than 2 seconds.     Findings: No ecchymosis or rash. Rash is not nodular or papular.     Nails: There is no clubbing.   Neurological:     General: No focal deficit present.     Mental Status: She is alert and oriented to person, place, and time.     Cranial Nerves: No cranial nerve deficit.     Sensory: No sensory deficit.     Motor: No abnormal muscle tone.     Coordination: Coordination normal.     Deep Tendon Reflexes: Reflexes are normal and symmetric. Reflexes normal.  Psychiatric:        Mood and Affect: Mood normal.        Behavior: Behavior normal.        Thought Content: Thought content normal.        Judgment: Judgment normal.       Medical decision-making section     DATA  MRI REPORT:   Muscles: No atrophy. Slight edema deep to the infraspinatus is felt to be due to the joint effusion.   FINDINGS: Rotator cuff: There is a small irregular full-thickness tear of the distal supraspinatus tendon. There is extensive degeneration of the adjacent tendon. The remainder of the rotator cuff is intact.   Muscles: No atrophy. Slight edema deep to the infraspinatus is felt to be due to the joint effusion.   Biceps long head:  Properly located and intact.   Acromioclavicular Joint: Moderate AC joint hypertrophy with a slight type 3 acromion which could predispose to impingement. AC joint effusion. Fluid and debris in the distended subacromial and subdeltoid bursae.   Glenohumeral  Joint: Prominent glenohumeral joint effusion with debris in the joint.   Labrum:  Intact.   Bones: Subtle edema in the posteromedial aspect of the humeral head which may represent a bone contusion. Slight degenerative changes of the posterior aspect of the greater tuberosity.   Other: None   IMPRESSION: 1. Small focal full-thickness tear of the supraspinatus tendon with degeneration of the adjacent tendon. 2. Prominent glenohumeral joint effusion extending into the subacromial/subdeltoid bursae with debris in the joint and bursae. 3. Probable small bone contusion of the posteromedial aspect of the humeral head.     Electronically Signed   By: Francene BoyersJames  Maxwell M.D.   On: 10/11/2018 11:52         MY READING: MRI OF THE right shoulder MRI exhibits torn rotator cuff without atrophy joint effusion is noted         Encounter Diagnosis  Name Primary?  . Traumatic complete tear of right rotator cuff, subsequent encounter Yes      Plan right open rotator cuff repair with push locks and corkscrews     Meds ordered this encounter  Medications  . HYDROcodone-acetaminophen (NORCO/VICODIN) 5-325 MG tablet      Sig: Take 1 tablet by mouth every 6 (six) hours as needed for moderate pain.      Dispense:  30 tablet      Refill:  0        The procedure has been fully reviewed with the patient; The risks and benefits of surgery have been discussed and explained and understood. Alternative treatment has also been reviewed, questions were encouraged and answered. The postoperative plan is also been reviewed.     PLAN:  Surgery : right open rotator cuff repair with push locks and corkscrews

## 2018-10-29 NOTE — Anesthesia Procedure Notes (Signed)
Anesthesia Regional Block: Interscalene brachial plexus block   Pre-Anesthetic Checklist: ,, timeout performed, Correct Patient, Correct Site, Correct Laterality, Correct Procedure, Correct Position, site marked, Risks and benefits discussed, at surgeon's request and post-op pain management  Laterality: Upper  Prep: chloraprep       Needles:  Injection technique: Single-shot      Needle Gauge: 25     Additional Needles:   Procedures:,,,, ultrasound used (permanent image in chart),,,,   Nerve Stimulator or Paresthesia:  Response: Twitch elicited,   Additional Responses:   Narrative:  Start time: 10/29/2018 7:22 AM End time: 10/29/2018 7:29 AM  Performed by: Personally  Anesthesiologist: Shona Needles, MD  Additional Notes: Block assessed prior to start of surgery Ropivicaine 0.5% with decadron 8mg  Versed 4mg  titrated for anxiolysis

## 2018-10-29 NOTE — Brief Op Note (Signed)
10/29/2018  9:03 AM  PATIENT:  Beth Meza  73 y.o. female  PRE-OPERATIVE DIAGNOSIS:  right shoulder rotator cuff tear  POST-OPERATIVE DIAGNOSIS:  right shoulder rotator cuff tear  PROCEDURE:  Procedure(s): OPEN ROTATOR CUFF REPAIR WITH ACHROMIOPLASTY (Right)-23410  SURGEON:  Surgeon(s) and Role:    Vickki Hearing, MD - Primary  Details of surgery:  Patient was seen in the preop area identity was confirmed.  Surgical site was confirmed marked chart review was completed.  The patient was taken to the operating room where a paracervical block was administered by anesthesia without complication.  Patient then underwent general anesthesia with intubation.  She was then placed in the modified beachchair position followed by sterile prep and drape.  A timeout was then completed surgical site was confirmed operative films were pin in place   The subcutaneous tissue was injected over the right acromion and deltoid the incision was made over the anterolateral corner of the acromion extended distally.  Subcutaneous tissue was divided medial lateral flaps were created the deltoid fascia was identified and split up to the acromion and then extended in a subperiosteal manner laterally and anteriorly exposing the acromion.  Rotator cuff tear was identified as a small 1 cm tear no retraction and there was also abrasion of the supraspinatus and infraspinatus which was debrided.  Acromioplasty was performed and then a rasp was used to contour the acromion.  1 corkscrew 5.5 anchor was placed and the 2 sutures for the graft from the corkscrew was used to repair the rotator cuff.  We obtained a water tight sugar.  The wound was irrigated.  The deltoid fascia was closed with interrupted #2 Ethibond suture followed by subcutaneous closure with 0 Monocryl and 2-0 Monocryl.  Benzoin and Steri-Strips were used to reapproximate the skin edges  The subacromial space was injected with 50 cc of  Marcaine with epinephrine  A sterile dressing and sling were applied the patient was extubated taken to recovery room in stable condition  PHYSICIAN ASSISTANT:   ASSISTANTS: betty ashley   ANESTHESIA:   general and paracervical block  EBL:  15 mL   BLOOD ADMINISTERED:none  DRAINS: none   LOCAL MEDICATIONS USED:  MARCAINE     SPECIMEN:  No Specimen  DISPOSITION OF SPECIMEN:  N/A  COUNTS:  YES  TOURNIQUET:  * No tourniquets in log *  DICTATION: .Dragon Dictation  PLAN OF CARE: Discharge to home after PACU  PATIENT DISPOSITION:  PACU - hemodynamically stable.   Delay start of Pharmacological VTE agent (>24hrs) due to surgical blood loss or risk of bleeding: not applicable

## 2018-10-29 NOTE — Discharge Instructions (Signed)
Keep your dressing clean and dry  You can remove your sling for getting dressed and undressed but no showers until you see the doctor  You can use your right arm to eat and drink but do not lift anything or move your arm away from your body.  During the time of eating and drinking you are allowed to remove your sling

## 2018-10-29 NOTE — Transfer of Care (Signed)
Immediate Anesthesia Transfer of Care Note  Patient: Beth Meza  Procedure(s) Performed: OPEN ROTATOR CUFF REPAIR WITH ACHROMIOPLASTY (Right Shoulder)  Patient Location: PACU  Anesthesia Type:General  Level of Consciousness: awake and patient cooperative  Airway & Oxygen Therapy: Patient Spontanous Breathing and Patient connected to face mask oxygen  Post-op Assessment: Report given to RN, Post -op Vital signs reviewed and stable and Patient moving all extremities  Post vital signs: Reviewed and stable  Last Vitals:  Vitals Value Taken Time  BP    Temp    Pulse 92 10/29/2018  9:09 AM  Resp    SpO2 100 % 10/29/2018  9:09 AM  Vitals shown include unvalidated device data.  Last Pain:  Vitals:   10/29/18 0637  TempSrc: Oral  PainSc: 6          Complications: No apparent anesthesia complications

## 2018-10-30 ENCOUNTER — Encounter (HOSPITAL_COMMUNITY): Payer: Self-pay | Admitting: Orthopedic Surgery

## 2018-11-06 ENCOUNTER — Ambulatory Visit (INDEPENDENT_AMBULATORY_CARE_PROVIDER_SITE_OTHER): Payer: Medicare Other | Admitting: Orthopedic Surgery

## 2018-11-06 ENCOUNTER — Encounter: Payer: Self-pay | Admitting: Orthopedic Surgery

## 2018-11-06 VITALS — BP 150/76 | HR 96 | Ht 67.0 in | Wt 174.0 lb

## 2018-11-06 DIAGNOSIS — Z9889 Other specified postprocedural states: Secondary | ICD-10-CM

## 2018-11-06 MED ORDER — HYDROCODONE-ACETAMINOPHEN 5-325 MG PO TABS: 1.0000 | ORAL_TABLET | ORAL | 0 refills | Status: AC | PRN

## 2018-11-06 NOTE — Progress Notes (Signed)
POSTOP VISIT  POD # 8  Chief Complaint  Patient presents with  . Post-op Follow-up    right shoulder rotator cuff repair 10/29/18    73 year old female postop day 8 open rotator cuff repair.  She is doing well her wound looks good  We are going to have her start therapy within the next week  Follow-up next week for the suture ends to be cut at the skin edge  Her follow-up after that will be 4 weeks  Hydrocodone refill   Encounter Diagnosis  Name Primary?  . S/P right rotator cuff repair right 10/29/2018 Yes      Postoperative plan (Work, [employer],  Meds ordered this encounter  Medications  . HYDROcodone-acetaminophen (NORCO/VICODIN) 5-325 MG tablet    Sig: Take 1 tablet by mouth every 4 (four) hours as needed for up to 7 days for moderate pain.    Dispense:  42 tablet    Refill:  0  ,FU)

## 2018-11-06 NOTE — Progress Notes (Signed)
pos

## 2018-11-07 ENCOUNTER — Ambulatory Visit (INDEPENDENT_AMBULATORY_CARE_PROVIDER_SITE_OTHER): Payer: Medicare Other | Admitting: Neurology

## 2018-11-07 ENCOUNTER — Encounter: Payer: Self-pay | Admitting: Neurology

## 2018-11-07 VITALS — BP 122/84 | HR 101 | Ht 67.0 in | Wt 173.0 lb

## 2018-11-07 DIAGNOSIS — G4489 Other headache syndrome: Secondary | ICD-10-CM | POA: Diagnosis not present

## 2018-11-07 DIAGNOSIS — R42 Dizziness and giddiness: Secondary | ICD-10-CM

## 2018-11-07 DIAGNOSIS — R413 Other amnesia: Secondary | ICD-10-CM

## 2018-11-07 HISTORY — DX: Other headache syndrome: G44.89

## 2018-11-07 MED ORDER — VENLAFAXINE HCL ER 37.5 MG PO CP24: 75.0000 mg | ORAL_CAPSULE | Freq: Every day | ORAL | 5 refills | Status: DC

## 2018-11-07 NOTE — Patient Instructions (Signed)
We will restart the Effexor 37.5 mg tablet. Take one a day for 2 weeks, then take 2 a day.

## 2018-11-07 NOTE — Progress Notes (Signed)
Reason for visit: Headache, dizziness, memory problems  Beth Meza is an 73 y.o. female  History of present illness:  Ms. Beth Meza is a 73 year old right-handed black female with a history of chronic headache and dizziness and a mild memory disorder.  The patient has had MRI evaluation of the brain in the past that was normal.  The patient continues to have some symptoms, she has been feeling better on a combination of Zonegran and Effexor, but she has run out of the medications within the last year, she was last seen through this office in 2018.  The patient has recently had surgery for a right rotator cuff tear, she will be entering into physical therapy in the near future.  She returns for further evaluation.  She reports lightheaded floaty sensation in the head with sitting, standing, and lying down.  The headache is usually fairly mild but is always present.  She returns for further management.  Past Medical History:  Diagnosis Date  . Anemia   . Back spasm   . Degenerative arthritis   . Diabetes (HCC)    diet controlled  . Dizziness and giddiness 03/07/2013  . Dyslipidemia   . Gastroesophageal reflux disease   . Hand cramps   . Headache(784.0) 03/07/2013  . History of carpal tunnel syndrome    Bilateral  . HOH (hard of hearing)   . Hypertension   . Leg cramps   . Memory deficit 02/25/2014  . Seizures (HCC)    as child- unknown etiology-no meds    Past Surgical History:  Procedure Laterality Date  . ANKLE SURGERY Left    History of ankle fracture  . BACK SURGERY    . COLONOSCOPY N/A 05/04/2014   Procedure: COLONOSCOPY;  Surgeon: West BaliSandi L Fields, MD;  [facility];  Service: Endoscopy;  Laterality: N/A;  11:30  . HAND SURGERY     Carpal tunnel syndrome, trigger fingers bilaterally  . KNEE SURGERY Left    Pin, history of fracture  . LUMBAR LAMINECTOMY/DECOMPRESSION MICRODISCECTOMY N/A 10/29/2017   Procedure: LUMBAR LAMINECTOMY AND FORAMINOTOMY LUMBAR THREE-  LUMBAR FOUR, LUMBAR FOUR- LUMBAR FIVE;  Surgeon: Tressie StalkerJenkins, Jeffrey, MD;  [facility];  Service: Neurosurgery;  Laterality: N/A;  . plate in let leg    . SHOULDER ACROMIOPLASTY Left 06/10/2014   Procedure: SHOULDER ACROMIOPLASTY;  Surgeon: Vickki HearingStanley E Harrison, MD;  Location: [facility];  Service: Orthopedics;  Laterality: Left;  . SHOULDER OPEN ROTATOR CUFF REPAIR Left 04/08/2014   Dr. Romeo AppleHarrison  . SHOULDER OPEN ROTATOR CUFF REPAIR Left 06/10/2014   Procedure: REVISION ROTATOR CUFF REPAIR SHOULDER OPEN WITH GRAFT;  Surgeon: Vickki HearingStanley E Harrison, MD;  Location: [facility];  Service: Orthopedics;  Laterality: Left;  . SHOULDER OPEN ROTATOR CUFF REPAIR Right 10/29/2018   Procedure: OPEN ROTATOR CUFF REPAIR WITH ACHROMIOPLASTY;  Surgeon: Vickki HearingHarrison, Stanley E, MD;  Location: [facility];  Service: Orthopedics;  Laterality: Right;  . TUBAL LIGATION      Family History  Problem Relation Age of Onset  . Heart attack Father   . Colon cancer Neg Hx     Social history:  reports that she has never smoked. She has never used smokeless tobacco. She reports that she does not drink alcohol or use drugs.   No Known Allergies  Medications:  Prior to Admission medications   Medication Sig Start Date End Date Taking? Authorizing Provider  cyclobenzaprine (FLEXERIL) 10 MG tablet Take 1 tablet (10 mg total) by mouth 3 (three) times  daily as needed for muscle spasms. 10/30/17  Yes Tressie Stalker, MD  Ferrous Gluconate (IRON 27 PO) Take 27 mg by mouth 2 (two) times daily.   Yes [provider]  HYDROcodone-acetaminophen (NORCO/VICODIN) 5-325 MG tablet Take 1 tablet by mouth every 4 (four) hours as needed for up to 7 days for moderate pain. 11/06/18 11/13/18 Yes Vickki Hearing, MD  ibuprofen (ADVIL,MOTRIN) 800 MG tablet  08/21/18  Yes [provider]  lisinopril-hydrochlorothiazide (PRINZIDE,ZESTORETIC) 20-12.5 MG tablet Take 1 tablet by mouth daily.   Yes [provider]  metFORMIN (GLUCOPHAGE) 500 MG  tablet Take 500 mg by mouth 2 (two) times daily. 05/04/15  Yes [provider]  Multiple Vitamins-Minerals (SENIOR MULTIVITAMIN PLUS PO) Take 1 tablet by mouth daily.   Yes [provider]  Omega-3 Fatty Acids (FISH OIL) 1000 MG CAPS Take 1,000 mg by mouth daily.    Yes [provider]  pravastatin (PRAVACHOL) 40 MG tablet Take 40 mg by mouth daily. 02/11/14  Yes [provider]  promethazine (PHENERGAN) 12.5 MG tablet Take 1 tablet (12.5 mg total) by mouth every 6 (six) hours as needed for nausea or vomiting. 10/29/18  Yes Vickki Hearing, MD  ranitidine (ZANTAC) 150 MG tablet TAKE 1 TABLET BY MOUTH TWICE A DAY 07/10/17  Yes [provider]  venlafaxine XR (EFFEXOR-XR) 37.5 MG 24 hr capsule Take 2 capsules (75 mg total) by mouth daily with breakfast. 05/24/17  Yes Butch Penny, NP  vitamin C (ASCORBIC ACID) 500 MG tablet Take 500 mg by mouth daily.   Yes [provider]  zonisamide (ZONEGRAN) 25 MG capsule TAKE 1 CAPSULE (25 MG TOTAL) BY MOUTH DAILY. 12/07/17  Yes Butch Penny, NP    ROS:  Out of a complete 14 system review of symptoms, the patient complains only of the following symptoms, and all other reviewed systems are negative.  Chills, fatigue Double vision, eye pain Black stools, constipation Excessive eating Walking difficulty Moles Memory loss  Blood pressure 122/84, pulse (!) 101, height 5\' 7"  (1.702 m), weight 173 lb (78.5 kg).  Physical Exam  General: The patient is alert and cooperative at the time of the examination.  Skin: No significant peripheral edema is noted.   Neurologic Exam  Mental status: The patient is alert and oriented x 3 at the time of the examination. The patient has apparent normal recent and remote memory, with an apparently normal attention span and concentration ability.   Cranial nerves: Facial symmetry is present. Speech is normal, no aphasia or dysarthria is noted. Extraocular movements  are full. Visual fields are full.  Motor: The patient has good strength in all 4 extremities.  The right arm is in a sling, she does not have full use of the arm.  Sensory examination: Soft touch sensation is symmetric on the face, arms, and legs.  Coordination: The patient has good finger-nose-finger and heel-to-shin bilaterally.  Gait and station: The patient has a normal gait. Tandem gait is unsteady. Romberg is negative. No drift is seen.  Reflexes: Deep tendon reflexes are symmetric.   Assessment/Plan:  1.  Chronic dizziness  2.  Chronic daily headache  3.  Mild memory disturbance  The patient will restart the Effexor, she will take 37.5 mg daily for 2 weeks and go to 75 mg daily.  A prescription was sent in.  The patient will follow-up in about 6 months, she will call for any dose adjustments of her medication.  Previously, the Effexor did  seem to help her headache and dizziness.  Marlan Palau MD 11/07/2018 2:59 PM  [facility] [address] [place], [place] [address]  Phone [phone] Fax [phone]

## 2018-11-12 ENCOUNTER — Encounter: Payer: Self-pay | Admitting: Orthopedic Surgery

## 2018-11-12 ENCOUNTER — Ambulatory Visit (INDEPENDENT_AMBULATORY_CARE_PROVIDER_SITE_OTHER): Payer: Medicare Other | Admitting: Orthopedic Surgery

## 2018-11-12 DIAGNOSIS — Z9889 Other specified postprocedural states: Secondary | ICD-10-CM

## 2018-11-12 NOTE — Progress Notes (Signed)
Patient returns today for suture ends to be trimmed at skin edge. Incision looks good with no sign of redness, no drainage.She tolerated suture removal well without any complaints of pain. She starts physical therapy on Friday. She will follow up in four weeks with Dr Romeo Apple, and let is know if she needs anything prior to her next appointment. Patient instructed she may now shower.

## 2018-11-15 ENCOUNTER — Ambulatory Visit: Payer: Medicare Other | Attending: Orthopedic Surgery | Admitting: Physical Therapy

## 2018-11-15 ENCOUNTER — Other Ambulatory Visit: Payer: Self-pay

## 2018-11-15 DIAGNOSIS — M25611 Stiffness of right shoulder, not elsewhere classified: Secondary | ICD-10-CM | POA: Insufficient documentation

## 2018-11-15 DIAGNOSIS — M25511 Pain in right shoulder: Secondary | ICD-10-CM | POA: Diagnosis present

## 2018-11-15 NOTE — Therapy (Signed)
[facility] [address] [place], [place], [address] Phone: [phone]   Fax:  [phone]  Physical Therapy Treatment  Patient Details  Name: Beth Meza MRN: [id number] Date of Birth: Jan 19, 1946 Referring Provider (PT): Fuller Canada MD.   Encounter Date: 11/15/2018  PT End of Session - 11/15/18 1108    Visit Number  1    Number of Visits  16    Date for PT Re-Evaluation  02/13/19    Authorization Type  FOTO AT LEAST EVERY 5TH VISIT.  KX MODIFIER AFTER 15 VISITS.  PROGRESS NOTE AT 10TH VISIT.    PT Start Time  [phone]    PT Stop Time  1022    PT Time Calculation (min)  38 min    Activity Tolerance  Patient tolerated treatment well    Behavior During Therapy  WFL for tasks assessed/performed       Past Medical History:  Diagnosis Date  . Anemia   . Back spasm   . Degenerative arthritis   . Diabetes (HCC)    diet controlled  . Dizziness and giddiness 03/07/2013  . Dyslipidemia   . Gastroesophageal reflux disease   . Hand cramps   . Headache syndrome 11/07/2018  . Headache(784.0) 03/07/2013  . History of carpal tunnel syndrome    Bilateral  . HOH (hard of hearing)   . Hypertension   . Leg cramps   . Memory deficit 02/25/2014  . Seizures (HCC)    as child- unknown etiology-no meds    Past Surgical History:  Procedure Laterality Date  . ANKLE SURGERY Left    History of ankle fracture  . BACK SURGERY    . COLONOSCOPY N/A 05/04/2014   Procedure: COLONOSCOPY;  Surgeon: West Bali, MD;  [facility];  Service: Endoscopy;  Laterality: N/A;  11:30  . HAND SURGERY     Carpal tunnel syndrome, trigger fingers bilaterally  . KNEE SURGERY Left    Pin, history of fracture  . LUMBAR LAMINECTOMY/DECOMPRESSION MICRODISCECTOMY N/A 10/29/2017   Procedure: LUMBAR LAMINECTOMY AND FORAMINOTOMY LUMBAR THREE- LUMBAR FOUR, LUMBAR FOUR- LUMBAR FIVE;  Surgeon: Tressie Stalker, MD;  Location: [facility] OR;  Service: Neurosurgery;   Laterality: N/A;  . plate in let leg    . SHOULDER ACROMIOPLASTY Left 06/10/2014   Procedure: SHOULDER ACROMIOPLASTY;  Surgeon: Vickki Hearing, MD;  Location: [facility];  Service: Orthopedics;  Laterality: Left;  . SHOULDER OPEN ROTATOR CUFF REPAIR Left 04/08/2014   Dr. Romeo Apple  . SHOULDER OPEN ROTATOR CUFF REPAIR Left 06/10/2014   Procedure: REVISION ROTATOR CUFF REPAIR SHOULDER OPEN WITH GRAFT;  Surgeon: Vickki Hearing, MD;  Location: [facility];  Service: Orthopedics;  Laterality: Left;  . SHOULDER OPEN ROTATOR CUFF REPAIR Right 10/29/2018   Procedure: OPEN ROTATOR CUFF REPAIR WITH ACHROMIOPLASTY;  Surgeon: Vickki Hearing, MD;  Location: [facility];  Service: Orthopedics;  Laterality: Right;  . TUBAL LIGATION      There were no vitals filed for this visit.  Subjective Assessment - 11/15/18 1123    Subjective  The patient fell and sustained a right shoulder injury and subsquently underwent a right RTC repair surgery on 10/29/18.  Her pain-level today is a 6/10.  Cold packs decrease her pain and movement increases her pain.    Pertinent History  Degenerative arthritis, ankle, left shoulder and lumbar surgery, plate in left LE.    Patient Stated Goals  Use right arm again.    Currently in Pain?  Yes  Pain Score  6     Pain Location  Shoulder    Pain Orientation  Right    Pain Descriptors / Indicators  Aching;Throbbing    Pain Type  Surgical pain    Pain Onset  1 to 4 weeks ago    Pain Frequency  Constant    Aggravating Factors   See above.    Pain Relieving Factors  See above.         [facility] - 11/15/18 0001      Assessment   Medical Diagnosis  S/p right rotator cuff repair.    Referring Provider (PT)  Fuller CanadaStanley Harrison MD.    Onset Date/Surgical Date  --   10/29/18(surgery date).     Precautions   Precautions  --   Please begin with PROM to patient's right shoulder.     Restrictions   Weight Bearing Restrictions  No      Balance Screen   Has the patient fallen  in the past 6 months  Yes    How many times?  --   1.   Has the patient had a decrease in activity level because of a fear of falling?   Yes    Is the patient reluctant to leave their home because of a fear of falling?   Yes      Home Environment   Living Environment  Private residence      Prior Function   Level of Independence  Independent      Observation/Other Assessments   Focus on Therapeutic Outcomes (FOTO)   76% limitation.      Posture/Postural Control   Posture Comments  Guarded posture.      ROM / Strength   AROM / PROM / Strength  PROM      PROM   Overall PROM Comments  In supine:  Right shoulder flexion to 40 degrees, ER to 0 degrees and IR to abdomen.      Palpation   Palpation comment  Tender to palption around right shoulder incisional site.      Ambulation/Gait   Gait Comments  The patient walks with a straight cane and has her right shoulder sling donned.                   [facility] Treatment/Exercise - 11/15/18 0001      Modalities   Modalities  Cryotherapy;Electrical Stimulation      Cryotherapy   Number Minutes Cryotherapy  15 Minutes    Cryotherapy Location  --   Right shoulder.   Type of Cryotherapy  Ice pack      Electrical Stimulation   Electrical Stimulation Location  Right shoulder.    Electrical Stimulation Action  IFC    Electrical Stimulation Parameters  80-150 Hz x 15 minutes.    Electrical Stimulation Goals  Pain             PT Education - 11/15/18 1110    Education Details  Pendulum, passive right shoulder ER stretch in supine with cane and passive countertop stretch to increase flexion.    Person(s) Educated  Patient    Methods  Explanation;Demonstration;Tactile cues;Verbal cues;Handout    Comprehension  Verbalized understanding;Returned demonstration;Verbal cues required;Tactile cues required;Need further instruction       PT Short Term Goals - 11/15/18 1214      PT SHORT TERM GOAL #1   Title   STG's=LTG's.        PT Long Term Goals -  11/15/18 1216      PT LONG TERM GOAL #1   Title  Independent with a HEP    Time  8    Period  Weeks    Status  New      PT LONG TERM GOAL #2   Title  Active shoulder right flexion to 145 degrees so the patient can easily reach overhead.    Time  8    Period  Weeks    Status  New      PT LONG TERM GOAL #3   Title  Active ER to 70 degrees+ to allow for easily donning/doffing of apparel.    Time  8    Period  Weeks    Status  New      PT LONG TERM GOAL #4   Title  Increase ROM so patient is able to reach behind back.    Time  8    Period  Weeks    Status  New      PT LONG TERM GOAL #5   Title  Increase right shoulder strength to a solid 4+/5 to increase stability for performance of functional activities.    Time  8    Period  Weeks    Status  New      PT LONG TERM GOAL #6   Title  Perform ADL's with right shoulder pain not > 2-3/10.            Plan - 11/15/18 1134    Clinical Impression Statement  The patient presents to OPPT s/p right shoulder surgery.  Her passive range of motion is currently very limited.  She is tender to palpation around her right shoulder incisional site.  Her FOTO limitation was 96%.  Patient will benefit from skilled physical therapy intervention to address deficits.    History and Personal Factors relevant to plan of care:  Degenerative arthritis, ankle, left shoulder and lumbar surgery, plate in left LE, vertigo.    Clinical Presentation  Stable    Clinical Decision Making  Low    Rehab Potential  Good    PT Frequency  2x / week    PT Duration  8 weeks    PT Treatment/Interventions  ADLs/Self Care Home Management;Cryotherapy;Electrical Stimulation;Therapeutic activities;Therapeutic exercise;Patient/family education;Manual techniques;Ultrasound;Vasopneumatic Device    PT Next Visit Plan  Please begin with right shoulder PROM with progression to AAROM.  Vasopneumatic and electrical stimulation.     Consulted and Agree with Plan of Care  Patient       Patient will benefit from skilled therapeutic intervention in order to improve the following deficits and impairments:  Pain, Decreased activity tolerance, Decreased range of motion  Visit Diagnosis: Acute pain of right shoulder - Plan: PT plan of care cert/re-cert  Stiffness of right shoulder, not elsewhere classified - Plan: PT plan of care cert/re-cert     Problem List Patient Active Problem List   Diagnosis Date Noted  . Headache syndrome 11/07/2018  . S/P right rotator cuff repair right 10/29/2018   . Spinal stenosis of lumbar region with neurogenic claudication 10/29/2017  . Tremor 09/11/2014  . Unspecified constipation 04/30/2014  . Rotator cuff (capsule) sprain 04/13/2014  . Rotator cuff tear, left 02/26/2014  . Memory deficit 02/25/2014  . Dizziness and giddiness 03/07/2013  . Headache 03/07/2013    , [place] MPT 11/15/2018, 12:23 PM  [facility] [address] [place], [place], [address] Phone: [phone]   Fax:  [phone]  Name:  LAVERNA FANOUS MRN: [id number] Date of Birth: [birth year]

## 2018-11-18 ENCOUNTER — Ambulatory Visit: Payer: Medicare Other | Admitting: Physical Therapy

## 2018-11-18 DIAGNOSIS — M25611 Stiffness of right shoulder, not elsewhere classified: Secondary | ICD-10-CM

## 2018-11-18 DIAGNOSIS — M25511 Pain in right shoulder: Secondary | ICD-10-CM | POA: Diagnosis not present

## 2018-11-18 NOTE — Therapy (Signed)
[facility] [address] [place], [place], [address] Phone: [phone]   Fax:  [phone]  Physical Therapy Treatment  Patient Details  Name: Beth Meza MRN: [id number] Date of Birth: [birth year] Referring Provider (PT): Fuller CanadaStanley Harrison MD.   Encounter Date: 11/18/2018  PT End of Session - 11/18/18 1219    Visit Number  2    Number of Visits  16    Date for PT Re-Evaluation  02/13/19    Authorization Type  FOTO AT LEAST EVERY 5TH VISIT.  KX MODIFIER AFTER 15 VISITS.  PROGRESS NOTE AT 10TH VISIT.    PT Start Time  0945    PT Stop Time  1040    PT Time Calculation (min)  55 min    Activity Tolerance  Patient tolerated treatment well    Behavior During Therapy  WFL for tasks assessed/performed       Past Medical History:  Diagnosis Date  . Anemia   . Back spasm   . Degenerative arthritis   . Diabetes (HCC)    diet controlled  . Dizziness and giddiness 03/07/2013  . Dyslipidemia   . Gastroesophageal reflux disease   . Hand cramps   . Headache syndrome 11/07/2018  . Headache(784.0) 03/07/2013  . History of carpal tunnel syndrome    Bilateral  . HOH (hard of hearing)   . Hypertension   . Leg cramps   . Memory deficit 02/25/2014  . Seizures (HCC)    as child- unknown etiology-no meds    Past Surgical History:  Procedure Laterality Date  . ANKLE SURGERY Left    History of ankle fracture  . BACK SURGERY    . COLONOSCOPY N/A 05/04/2014   Procedure: COLONOSCOPY;  Surgeon: West BaliSandi L Fields, MD;  [facility];  Service: Endoscopy;  Laterality: N/A;  11:30  . HAND SURGERY     Carpal tunnel syndrome, trigger fingers bilaterally  . KNEE SURGERY Left    Pin, history of fracture  . LUMBAR LAMINECTOMY/DECOMPRESSION MICRODISCECTOMY N/A 10/29/2017   Procedure: LUMBAR LAMINECTOMY AND FORAMINOTOMY LUMBAR THREE- LUMBAR FOUR, LUMBAR FOUR- LUMBAR FIVE;  Surgeon: Tressie StalkerJenkins, Jeffrey, MD;  [facility];  Service: Neurosurgery;   Laterality: N/A;  . plate in let leg    . SHOULDER ACROMIOPLASTY Left 06/10/2014   Procedure: SHOULDER ACROMIOPLASTY;  Surgeon: Vickki HearingStanley E Harrison, MD;  Location: [facility];  Service: Orthopedics;  Laterality: Left;  . SHOULDER OPEN ROTATOR CUFF REPAIR Left 04/08/2014   Dr. Romeo AppleHarrison  . SHOULDER OPEN ROTATOR CUFF REPAIR Left 06/10/2014   Procedure: REVISION ROTATOR CUFF REPAIR SHOULDER OPEN WITH GRAFT;  Surgeon: Vickki HearingStanley E Harrison, MD;  Location: [facility];  Service: Orthopedics;  Laterality: Left;  . SHOULDER OPEN ROTATOR CUFF REPAIR Right 10/29/2018   Procedure: OPEN ROTATOR CUFF REPAIR WITH ACHROMIOPLASTY;  Surgeon: Vickki HearingHarrison, Stanley E, MD;  Location: [facility];  Service: Orthopedics;  Laterality: Right;  . TUBAL LIGATION      There were no vitals filed for this visit.  Subjective Assessment - 11/18/18 1220    Subjective  No new complaints.    Pertinent History  Degenerative arthritis, ankle, left shoulder and lumbar surgery, plate in left LE.    Patient Stated Goals  Use right arm again.    Currently in Pain?  Yes    Pain Score  6     Pain Location  Shoulder    Pain Orientation  Right    Pain Descriptors / Indicators  Aching;Throbbing  Pain Type  Surgical pain    Pain Onset  1 to 4 weeks ago      Treatment:  Went through HEP:  Pendulum, countertop stretch for passive right shoulder flexion and supine cane exercise to improve passive right shoulder ER f/b PROM in supine x 18 minutes with focus on flexion and ER.  Vasopneumatic with pillow between right shoulder and elbow and and IFC at 80-150 Hz x 20 minutes.                           PT Short Term Goals - 11/15/18 1214      PT SHORT TERM GOAL #1   Title  STG's=LTG's.        PT Long Term Goals - 11/15/18 1216      PT LONG TERM GOAL #1   Title  Independent with a HEP    Time  8    Period  Weeks    Status  New      PT LONG TERM GOAL #2   Title  Active shoulder right flexion to 145 degrees so the patient can  easily reach overhead.    Time  8    Period  Weeks    Status  New      PT LONG TERM GOAL #3   Title  Active ER to 70 degrees+ to allow for easily donning/doffing of apparel.    Time  8    Period  Weeks    Status  New      PT LONG TERM GOAL #4   Title  Increase ROM so patient is able to reach behind back.    Time  8    Period  Weeks    Status  New      PT LONG TERM GOAL #5   Title  Increase right shoulder strength to a solid 4+/5 to increase stability for performance of functional activities.    Time  8    Period  Weeks    Status  New      PT LONG TERM GOAL #6   Title  Perform ADL's with right shoulder pain not > 2-3/10.            Plan - 11/18/18 1236    Clinical Impression Statement  Patient did well today.  Went through HEP prior to PROM to her right shoulder.      PT Treatment/Interventions  ADLs/Self Care Home Management;Cryotherapy;Electrical Stimulation;Therapeutic activities;Therapeutic exercise;Patient/family education;Manual techniques;Ultrasound;Vasopneumatic Device    PT Next Visit Plan  Please begin with right shoulder PROM with progression to AAROM.  Vasopneumatic and electrical stimulation.    Consulted and Agree with Plan of Care  Patient       Patient will benefit from skilled therapeutic intervention in order to improve the following deficits and impairments:     Visit Diagnosis: Acute pain of right shoulder  Stiffness of right shoulder, not elsewhere classified     Problem List Patient Active Problem List   Diagnosis Date Noted  . Headache syndrome 11/07/2018  . S/P right rotator cuff repair right 10/29/2018   . Spinal stenosis of lumbar region with neurogenic claudication 10/29/2017  . Tremor 09/11/2014  . Unspecified constipation 04/30/2014  . Rotator cuff (capsule) sprain 04/13/2014  . Rotator cuff tear, left 02/26/2014  . Memory deficit 02/25/2014  . Dizziness and giddiness 03/07/2013  . Headache 03/07/2013    , [place]  MPT 11/18/2018, 12:37 PM  [facility] [address] [place], [place], [address] Phone: [phone]   Fax:  [phone]  Name: MAIRA DOBLE MRN: [id number] Date of Birth: 21-Aug-1946

## 2018-11-22 ENCOUNTER — Ambulatory Visit: Payer: Medicare Other | Admitting: Physical Therapy

## 2018-11-22 ENCOUNTER — Encounter: Payer: Self-pay | Admitting: Physical Therapy

## 2018-11-22 DIAGNOSIS — M25511 Pain in right shoulder: Secondary | ICD-10-CM | POA: Diagnosis not present

## 2018-11-22 DIAGNOSIS — M25611 Stiffness of right shoulder, not elsewhere classified: Secondary | ICD-10-CM

## 2018-11-22 NOTE — Therapy (Signed)
[facility] [address] [place], [place], [address] Phone: [phone]   Fax:  [phone]  Physical Therapy Treatment  Patient Details  Name: Beth Meza MRN: [id number] Date of Birth: 29-Feb-1946 Referring Provider (PT): Fuller CanadaStanley Harrison MD.   Encounter Date: 11/22/2018  PT End of Session - 11/22/18 1248    Visit Number  3    Number of Visits  16    Date for PT Re-Evaluation  02/13/19    Authorization Type  FOTO AT LEAST EVERY 5TH VISIT.  KX MODIFIER AFTER 15 VISITS.  PROGRESS NOTE AT 10TH VISIT.    PT Start Time  0945    PT Stop Time  1041    PT Time Calculation (min)  56 min       Past Medical History:  Diagnosis Date  . Anemia   . Back spasm   . Degenerative arthritis   . Diabetes (HCC)    diet controlled  . Dizziness and giddiness 03/07/2013  . Dyslipidemia   . Gastroesophageal reflux disease   . Hand cramps   . Headache syndrome 11/07/2018  . Headache(784.0) 03/07/2013  . History of carpal tunnel syndrome    Bilateral  . HOH (hard of hearing)   . Hypertension   . Leg cramps   . Memory deficit 02/25/2014  . Seizures (HCC)    as child- unknown etiology-no meds    Past Surgical History:  Procedure Laterality Date  . ANKLE SURGERY Left    History of ankle fracture  . BACK SURGERY    . COLONOSCOPY N/A 05/04/2014   Procedure: COLONOSCOPY;  Surgeon: West BaliSandi L Fields, MD;  [facility];  Service: Endoscopy;  Laterality: N/A;  11:30  . HAND SURGERY     Carpal tunnel syndrome, trigger fingers bilaterally  . KNEE SURGERY Left    Pin, history of fracture  . LUMBAR LAMINECTOMY/DECOMPRESSION MICRODISCECTOMY N/A 10/29/2017   Procedure: LUMBAR LAMINECTOMY AND FORAMINOTOMY LUMBAR THREE- LUMBAR FOUR, LUMBAR FOUR- LUMBAR FIVE;  Surgeon: Tressie StalkerJenkins, Jeffrey, MD;  [facility];  Service: Neurosurgery;  Laterality: N/A;  . plate in let leg    . SHOULDER ACROMIOPLASTY Left 06/10/2014   Procedure: SHOULDER ACROMIOPLASTY;   Surgeon: Vickki HearingStanley E Harrison, MD;  Location: [facility];  Service: Orthopedics;  Laterality: Left;  . SHOULDER OPEN ROTATOR CUFF REPAIR Left 04/08/2014   Dr. Romeo AppleHarrison  . SHOULDER OPEN ROTATOR CUFF REPAIR Left 06/10/2014   Procedure: REVISION ROTATOR CUFF REPAIR SHOULDER OPEN WITH GRAFT;  Surgeon: Vickki HearingStanley E Harrison, MD;  Location: [facility];  Service: Orthopedics;  Laterality: Left;  . SHOULDER OPEN ROTATOR CUFF REPAIR Right 10/29/2018   Procedure: OPEN ROTATOR CUFF REPAIR WITH ACHROMIOPLASTY;  Surgeon: Vickki HearingHarrison, Stanley E, MD;  Location: [facility];  Service: Orthopedics;  Laterality: Right;  . TUBAL LIGATION      There were no vitals filed for this visit.  Subjective Assessment - 11/22/18 1251    Subjective  I'm doing those exercises.    Pertinent History  Degenerative arthritis, ankle, left shoulder and lumbar surgery, plate in left LE.    Currently in Pain?  Yes    Pain Score  5     Pain Location  Shoulder    Pain Orientation  Right    Pain Descriptors / Indicators  Aching;Throbbing    Pain Type  Surgical pain    Pain Onset  1 to 4 weeks ago         [facility] PT Assessment - 11/22/18 0001  PROM   Overall PROM Comments  Flexion= 88 degrees and ER= 28 degrees.                   [facility] Adult PT Treatment/Exercise - 11/22/18 0001      Modalities   Modalities  Electrical Stimulation;Vasopneumatic      Electrical Stimulation   Electrical Stimulation Location  --   Rigth shoulder.   Electrical Stimulation Action  IFC    Electrical Stimulation Parameters  80-150 Hz x 20 minutes.    Electrical Stimulation Goals  Pain      Vasopneumatic   Number Minutes Vasopneumatic   20 minutes    Vasopnuematic Location   --   Right shoulder.  Pillow between RT elbow and thorax.   Vasopneumatic Pressure  Low      Manual Therapy   Manual Therapy  Passive ROM    Passive ROM  In supine:  PROM into right shoulder flexion and ER with low load long duration stretching technique ultilized x 23  minutes.               PT Short Term Goals - 11/15/18 1214      PT SHORT TERM GOAL #1   Title  STG's=LTG's.        PT Long Term Goals - 11/15/18 1216      PT LONG TERM GOAL #1   Title  Independent with a HEP    Time  8    Period  Weeks    Status  New      PT LONG TERM GOAL #2   Title  Active shoulder right flexion to 145 degrees so the patient can easily reach overhead.    Time  8    Period  Weeks    Status  New      PT LONG TERM GOAL #3   Title  Active ER to 70 degrees+ to allow for easily donning/doffing of apparel.    Time  8    Period  Weeks    Status  New      PT LONG TERM GOAL #4   Title  Increase ROM so patient is able to reach behind back.    Time  8    Period  Weeks    Status  New      PT LONG TERM GOAL #5   Title  Increase right shoulder strength to a solid 4+/5 to increase stability for performance of functional activities.    Time  8    Period  Weeks    Status  New      PT LONG TERM GOAL #6   Title  Perform ADL's with right shoulder pain not > 2-3/10.            Plan - 11/22/18 1249    Clinical Impression Statement  Excellent job today with right shoulder passive flexion to 88 degrees and ER= 28 degrees.    Rehab Potential  Good    PT Frequency  2x / week    PT Duration  8 weeks    PT Treatment/Interventions  ADLs/Self Care Home Management;Cryotherapy;Electrical Stimulation;Therapeutic activities;Therapeutic exercise;Patient/family education;Manual techniques;Ultrasound;Vasopneumatic Device    PT Next Visit Plan  Please begin with right shoulder PROM with progression to AAROM.  Vasopneumatic and electrical stimulation.    Consulted and Agree with Plan of Care  Patient       Patient will benefit from skilled therapeutic intervention in order to improve the following deficits and  impairments:  Pain, Decreased activity tolerance, Decreased range of motion  Visit Diagnosis: Acute pain of right shoulder  Stiffness of right shoulder,  not elsewhere classified     Problem List Patient Active Problem List   Diagnosis Date Noted  . Headache syndrome 11/07/2018  . S/P right rotator cuff repair right 10/29/2018   . Spinal stenosis of lumbar region with neurogenic claudication 10/29/2017  . Tremor 09/11/2014  . Unspecified constipation 04/30/2014  . Rotator cuff (capsule) sprain 04/13/2014  . Rotator cuff tear, left 02/26/2014  . Memory deficit 02/25/2014  . Dizziness and giddiness 03/07/2013  . Headache 03/07/2013    , [place] MPT 11/22/2018, 1:03 PM  [facility] [address] [place], [place], [address] Phone: [phone]   Fax:  [phone]  Name: Beth Meza MRN: [id number] Date of Birth: [birth year]

## 2018-11-26 ENCOUNTER — Encounter: Payer: Self-pay | Admitting: Physical Therapy

## 2018-11-26 ENCOUNTER — Ambulatory Visit: Payer: Medicare Other | Admitting: Physical Therapy

## 2018-11-26 DIAGNOSIS — M25511 Pain in right shoulder: Secondary | ICD-10-CM

## 2018-11-26 DIAGNOSIS — M25611 Stiffness of right shoulder, not elsewhere classified: Secondary | ICD-10-CM

## 2018-11-26 NOTE — Therapy (Signed)
[facility] [address] [place], [place], [address] Phone: [phone]   Fax:  [phone]  Physical Therapy Treatment  Patient Details  Name: Beth Meza MRN: [id number] Date of Birth: 1946-07-01 Referring Provider (PT): Fuller Canada MD.   Encounter Date: 11/26/2018  PT End of Session - 11/26/18 0944    Visit Number  4    Number of Visits  16    Date for PT Re-Evaluation  02/13/19    Authorization Type  FOTO AT LEAST EVERY 5TH VISIT.  KX MODIFIER AFTER 15 VISITS.  PROGRESS NOTE AT 10TH VISIT.    PT Start Time  [phone]    PT Stop Time  1030    PT Time Calculation (min)  43 min    Activity Tolerance  Patient tolerated treatment well    Behavior During Therapy  WFL for tasks assessed/performed       Past Medical History:  Diagnosis Date  . Anemia   . Back spasm   . Degenerative arthritis   . Diabetes (HCC)    diet controlled  . Dizziness and giddiness 03/07/2013  . Dyslipidemia   . Gastroesophageal reflux disease   . Hand cramps   . Headache syndrome 11/07/2018  . Headache(784.0) 03/07/2013  . History of carpal tunnel syndrome    Bilateral  . HOH (hard of hearing)   . Hypertension   . Leg cramps   . Memory deficit 02/25/2014  . Seizures (HCC)    as child- unknown etiology-no meds    Past Surgical History:  Procedure Laterality Date  . ANKLE SURGERY Left    History of ankle fracture  . BACK SURGERY    . COLONOSCOPY N/A 05/04/2014   Procedure: COLONOSCOPY;  Surgeon: West Bali, MD;  [facility];  Service: Endoscopy;  Laterality: N/A;  11:30  . HAND SURGERY     Carpal tunnel syndrome, trigger fingers bilaterally  . KNEE SURGERY Left    Pin, history of fracture  . LUMBAR LAMINECTOMY/DECOMPRESSION MICRODISCECTOMY N/A 10/29/2017   Procedure: LUMBAR LAMINECTOMY AND FORAMINOTOMY LUMBAR THREE- LUMBAR FOUR, LUMBAR FOUR- LUMBAR FIVE;  Surgeon: Tressie Stalker, MD;  Location: [facility] OR;  Service: Neurosurgery;   Laterality: N/A;  . plate in let leg    . SHOULDER ACROMIOPLASTY Left 06/10/2014   Procedure: SHOULDER ACROMIOPLASTY;  Surgeon: Vickki Hearing, MD;  Location: [facility];  Service: Orthopedics;  Laterality: Left;  . SHOULDER OPEN ROTATOR CUFF REPAIR Left 04/08/2014   Dr. Romeo Apple  . SHOULDER OPEN ROTATOR CUFF REPAIR Left 06/10/2014   Procedure: REVISION ROTATOR CUFF REPAIR SHOULDER OPEN WITH GRAFT;  Surgeon: Vickki Hearing, MD;  Location: [facility];  Service: Orthopedics;  Laterality: Left;  . SHOULDER OPEN ROTATOR CUFF REPAIR Right 10/29/2018   Procedure: OPEN ROTATOR CUFF REPAIR WITH ACHROMIOPLASTY;  Surgeon: Vickki Hearing, MD;  Location: [facility];  Service: Orthopedics;  Laterality: Right;  . TUBAL LIGATION      There were no vitals filed for this visit.  Subjective Assessment - 11/26/18 0944    Subjective  Reports ready to get back to independence.    Pertinent History  Degenerative arthritis, ankle, left shoulder and lumbar surgery, plate in left LE.    Patient Stated Goals  Use right arm again.    Currently in Pain?  Yes    Pain Score  7     Pain Location  Shoulder    Pain Orientation  Right    Pain Descriptors / Indicators  Discomfort    Pain Type  Surgical pain    Pain Onset  1 to 4 weeks ago    Pain Frequency  Intermittent         [facility] PT Assessment - 11/26/18 0001      Assessment   Medical Diagnosis  S/p right rotator cuff repair.    Referring Provider (PT)  Fuller Canada MD.    Onset Date/Surgical Date  10/29/18    Next MD Visit  12/10/2018      Restrictions   Weight Bearing Restrictions  No                   [facility] Adult PT Treatment/Exercise - 11/26/18 0001      Modalities   Modalities  Electrical Stimulation;Vasopneumatic      Electrical Stimulation   Electrical Stimulation Location  R shoulder    Electrical Stimulation Action  IFC    Electrical Stimulation Parameters  80-150 hz x15 mn    Electrical Stimulation Goals  Pain       Vasopneumatic   Number Minutes Vasopneumatic   15 minutes    Vasopnuematic Location   Shoulder    Vasopneumatic Pressure  Low    Vasopneumatic Temperature   53      Manual Therapy   Manual Therapy  Passive ROM    Passive ROM  PROM of R shoulder into flex, ER, IR with gentle holds at end range               PT Short Term Goals - 11/15/18 1214      PT SHORT TERM GOAL #1   Title  STG's=LTG's.        PT Long Term Goals - 11/15/18 1216      PT LONG TERM GOAL #1   Title  Independent with a HEP    Time  8    Period  Weeks    Status  New      PT LONG TERM GOAL #2   Title  Active shoulder right flexion to 145 degrees so the patient can easily reach overhead.    Time  8    Period  Weeks    Status  New      PT LONG TERM GOAL #3   Title  Active ER to 70 degrees+ to allow for easily donning/doffing of apparel.    Time  8    Period  Weeks    Status  New      PT LONG TERM GOAL #4   Title  Increase ROM so patient is able to reach behind back.    Time  8    Period  Weeks    Status  New      PT LONG TERM GOAL #5   Title  Increase right shoulder strength to a solid 4+/5 to increase stability for performance of functional activities.    Time  8    Period  Weeks    Status  New      PT LONG TERM GOAL #6   Title  Perform ADL's with right shoulder pain not > 2-3/10.            Plan - 11/26/18 1022    Clinical Impression Statement  Patient presented in clinic with sling donned. Patient able to tolerate PROM of R shoulder into flexion, ER, IR although greater reports of discomfort and facial grimacing during ER and IR. Firm end feels and smooth arc of motion noted during  PROM of R shoulder into all directions assessed. Normal modalities response noted following removal of the modalities.    Rehab Potential  Good    PT Frequency  2x / week    PT Duration  8 weeks    PT Treatment/Interventions  ADLs/Self Care Home Management;Cryotherapy;Electrical  Stimulation;Therapeutic activities;Therapeutic exercise;Patient/family education;Manual techniques;Ultrasound;Vasopneumatic Device    PT Next Visit Plan  Please begin with right shoulder PROM with progression to AAROM.  Vasopneumatic and electrical stimulation.    Consulted and Agree with Plan of Care  Patient       Patient will benefit from skilled therapeutic intervention in order to improve the following deficits and impairments:  Pain, Decreased activity tolerance, Decreased range of motion  Visit Diagnosis: Acute pain of right shoulder  Stiffness of right shoulder, not elsewhere classified     Problem List Patient Active Problem List   Diagnosis Date Noted  . Headache syndrome 11/07/2018  . S/P right rotator cuff repair right 10/29/2018   . Spinal stenosis of lumbar region with neurogenic claudication 10/29/2017  . Tremor 09/11/2014  . Unspecified constipation 04/30/2014  . Rotator cuff (capsule) sprain 04/13/2014  . Rotator cuff tear, left 02/26/2014  . Memory deficit 02/25/2014  . Dizziness and giddiness 03/07/2013  . Headache 03/07/2013    Marvell FullerKelsey P Kennon, PTA 11/26/2018, 10:47 AM  [facility] [address] [place], [place], [address] Phone: [phone]   Fax:  [phone]  Name: Beth Meza MRN: [id number] Date of Birth: 1946-03-08

## 2018-11-28 ENCOUNTER — Ambulatory Visit: Payer: Medicare Other | Admitting: *Deleted

## 2018-11-28 DIAGNOSIS — M25611 Stiffness of right shoulder, not elsewhere classified: Secondary | ICD-10-CM

## 2018-11-28 DIAGNOSIS — M25511 Pain in right shoulder: Secondary | ICD-10-CM

## 2018-11-28 NOTE — Therapy (Signed)
[facility] [address] [place], [place], [address] Phone: [phone]   Fax:  [phone]  Physical Therapy Treatment  Patient Details  Name: Beth Meza MRN: [id number] Date of Birth: 1945-11-29 Referring Provider (PT): Fuller CanadaStanley Harrison MD.   Encounter Date: 11/28/2018  PT End of Session - 11/28/18 1037    Visit Number  5    Number of Visits  16    Date for PT Re-Evaluation  02/13/19    Authorization Type  FOTO AT LEAST EVERY 5TH VISIT.  KX MODIFIER AFTER 15 VISITS.  PROGRESS NOTE AT 10TH VISIT.     5th visit FOTO 56% limitation    PT Start Time  0945    PT Stop Time  1038    PT Time Calculation (min)  53 min       Past Medical History:  Diagnosis Date  . Anemia   . Back spasm   . Degenerative arthritis   . Diabetes (HCC)    diet controlled  . Dizziness and giddiness 03/07/2013  . Dyslipidemia   . Gastroesophageal reflux disease   . Hand cramps   . Headache syndrome 11/07/2018  . Headache(784.0) 03/07/2013  . History of carpal tunnel syndrome    Bilateral  . HOH (hard of hearing)   . Hypertension   . Leg cramps   . Memory deficit 02/25/2014  . Seizures (HCC)    as child- unknown etiology-no meds    Past Surgical History:  Procedure Laterality Date  . ANKLE SURGERY Left    History of ankle fracture  . BACK SURGERY    . COLONOSCOPY N/A 05/04/2014   Procedure: COLONOSCOPY;  Surgeon: West BaliSandi L Fields, MD;  [facility];  Service: Endoscopy;  Laterality: N/A;  11:30  . HAND SURGERY     Carpal tunnel syndrome, trigger fingers bilaterally  . KNEE SURGERY Left    Pin, history of fracture  . LUMBAR LAMINECTOMY/DECOMPRESSION MICRODISCECTOMY N/A 10/29/2017   Procedure: LUMBAR LAMINECTOMY AND FORAMINOTOMY LUMBAR THREE- LUMBAR FOUR, LUMBAR FOUR- LUMBAR FIVE;  Surgeon: Tressie StalkerJenkins, Jeffrey, MD;  [facility];  Service: Neurosurgery;  Laterality: N/A;  . plate in let leg    . SHOULDER ACROMIOPLASTY Left 06/10/2014   Procedure: SHOULDER ACROMIOPLASTY;  Surgeon: Vickki HearingStanley E Harrison, MD;  Location: [facility];  Service: Orthopedics;  Laterality: Left;  . SHOULDER OPEN ROTATOR CUFF REPAIR Left 04/08/2014   Dr. Romeo AppleHarrison  . SHOULDER OPEN ROTATOR CUFF REPAIR Left 06/10/2014   Procedure: REVISION ROTATOR CUFF REPAIR SHOULDER OPEN WITH GRAFT;  Surgeon: Vickki HearingStanley E Harrison, MD;  Location: [facility];  Service: Orthopedics;  Laterality: Left;  . SHOULDER OPEN ROTATOR CUFF REPAIR Right 10/29/2018   Procedure: OPEN ROTATOR CUFF REPAIR WITH ACHROMIOPLASTY;  Surgeon: Vickki HearingHarrison, Stanley E, MD;  Location: [facility];  Service: Orthopedics;  Laterality: Right;  . TUBAL LIGATION      There were no vitals filed for this visit.  Subjective Assessment - 11/28/18 0949    Subjective  Not sleeping well. Don't know why    Pertinent History  Degenerative arthritis, ankle, left shoulder and lumbar surgery, plate in left LE.    Patient Stated Goals  Use right arm again.    Currently in Pain?  Yes    Pain Score  5     Pain Location  Shoulder    Pain Orientation  Right    Pain Descriptors / Indicators  Discomfort    Pain Onset  1 to 4 weeks ago  Pain Frequency  Intermittent                       [facility] Adult PT Treatment/Exercise - 11/28/18 0001      Modalities   Modalities  Electrical Stimulation;Vasopneumatic      Electrical Stimulation   Electrical Stimulation Location  R shoulder  IFC x 15 mins 80-150hz     Electrical Stimulation Goals  Pain      Vasopneumatic   Number Minutes Vasopneumatic   15 minutes    Vasopnuematic Location   Shoulder    Vasopneumatic Pressure  Low    Vasopneumatic Temperature   38      Manual Therapy   Manual Therapy  Passive ROM    Passive ROM  PROM of R shoulder into flex, ER, IR with gentle holds at end range               PT Short Term Goals - 11/15/18 1214      PT SHORT TERM GOAL #1   Title  STG's=LTG's.        PT Long Term Goals - 11/15/18 1216      PT LONG TERM GOAL  #1   Title  Independent with a HEP    Time  8    Period  Weeks    Status  New      PT LONG TERM GOAL #2   Title  Active shoulder right flexion to 145 degrees so the patient can easily reach overhead.    Time  8    Period  Weeks    Status  New      PT LONG TERM GOAL #3   Title  Active ER to 70 degrees+ to allow for easily donning/doffing of apparel.    Time  8    Period  Weeks    Status  New      PT LONG TERM GOAL #4   Title  Increase ROM so patient is able to reach behind back.    Time  8    Period  Weeks    Status  New      PT LONG TERM GOAL #5   Title  Increase right shoulder strength to a solid 4+/5 to increase stability for performance of functional activities.    Time  8    Period  Weeks    Status  New      PT LONG TERM GOAL #6   Title  Perform ADL's with right shoulder pain not > 2-3/10.            Plan - 11/28/18 1038    Clinical Impression Statement  Pt arrived today doing fairly well with low pain levels, but unable to sleep for unknown reasons. She did well with PROM with flexion to 95 degrees, IR to abdomen, and ER to 35 degrees. Normal modality  respons.    Clinical Presentation  Stable    Clinical Decision Making  Low    Rehab Potential  Good    PT Frequency  2x / week    PT Duration  8 weeks    PT Treatment/Interventions  ADLs/Self Care Home Management;Cryotherapy;Electrical Stimulation;Therapeutic activities;Therapeutic exercise;Patient/family education;Manual techniques;Ultrasound;Vasopneumatic Device    PT Next Visit Plan  Please begin with right shoulder PROM with progression to AAROM.  Vasopneumatic and electrical stimulation.    Consulted and Agree with Plan of Care  Patient       Patient will benefit from skilled therapeutic intervention  in order to improve the following deficits and impairments:  Pain, Decreased activity tolerance, Decreased range of motion  Visit Diagnosis: Acute pain of right shoulder  Stiffness of right shoulder, not  elsewhere classified     Problem List Patient Active Problem List   Diagnosis Date Noted  . Headache syndrome 11/07/2018  . S/P right rotator cuff repair right 10/29/2018   . Spinal stenosis of lumbar region with neurogenic claudication 10/29/2017  . Tremor 09/11/2014  . Unspecified constipation 04/30/2014  . Rotator cuff (capsule) sprain 04/13/2014  . Rotator cuff tear, left 02/26/2014  . Memory deficit 02/25/2014  . Dizziness and giddiness 03/07/2013  . Headache 03/07/2013    ,CHRIS, PTA 11/28/2018, 11:07 AM  [facility] [address] [place], [place], [address] Phone: [phone]   Fax:  [phone]  Name: Beth Meza MRN: [id number] Date of Birth: 08/13/1946

## 2018-12-03 ENCOUNTER — Ambulatory Visit: Payer: Medicare Other | Admitting: Physical Therapy

## 2018-12-03 ENCOUNTER — Encounter: Payer: Self-pay | Admitting: Physical Therapy

## 2018-12-03 DIAGNOSIS — M25511 Pain in right shoulder: Secondary | ICD-10-CM | POA: Diagnosis not present

## 2018-12-03 DIAGNOSIS — M25611 Stiffness of right shoulder, not elsewhere classified: Secondary | ICD-10-CM

## 2018-12-03 NOTE — Therapy (Signed)
[facility] [address] [place], [place], [address] Phone: [phone]   Fax:  [phone]  Physical Therapy Treatment  Patient Details  Name: BREALYNN MCALEXANDER MRN: [id number] Date of Birth: [birth year] Referring Provider (PT): Fuller Canada MD.   Encounter Date: 12/03/2018  PT End of Session - 12/03/18 0946    Visit Number  6    Number of Visits  16    Date for PT Re-Evaluation  02/13/19    Authorization Type  FOTO AT LEAST EVERY 5TH VISIT.  KX MODIFIER AFTER 15 VISITS.  PROGRESS NOTE AT 10TH VISIT.     5th visit FOTO 56% limitation    PT Start Time  [phone]    PT Stop Time  1033    PT Time Calculation (min)  44 min    Activity Tolerance  Patient tolerated treatment well    Behavior During Therapy  WFL for tasks assessed/performed       Past Medical History:  Diagnosis Date  . Anemia   . Back spasm   . Degenerative arthritis   . Diabetes (HCC)    diet controlled  . Dizziness and giddiness 03/07/2013  . Dyslipidemia   . Gastroesophageal reflux disease   . Hand cramps   . Headache syndrome 11/07/2018  . Headache(784.0) 03/07/2013  . History of carpal tunnel syndrome    Bilateral  . HOH (hard of hearing)   . Hypertension   . Leg cramps   . Memory deficit 02/25/2014  . Seizures (HCC)    as child- unknown etiology-no meds    Past Surgical History:  Procedure Laterality Date  . ANKLE SURGERY Left    History of ankle fracture  . BACK SURGERY    . COLONOSCOPY N/A 05/04/2014   Procedure: COLONOSCOPY;  Surgeon: West Bali, MD;  [facility];  Service: Endoscopy;  Laterality: N/A;  11:30  . HAND SURGERY     Carpal tunnel syndrome, trigger fingers bilaterally  . KNEE SURGERY Left    Pin, history of fracture  . LUMBAR LAMINECTOMY/DECOMPRESSION MICRODISCECTOMY N/A 10/29/2017   Procedure: LUMBAR LAMINECTOMY AND FORAMINOTOMY LUMBAR THREE- LUMBAR FOUR, LUMBAR FOUR- LUMBAR FIVE;  Surgeon: Tressie Stalker, MD;  Location:  [facility] OR;  Service: Neurosurgery;  Laterality: N/A;  . plate in let leg    . SHOULDER ACROMIOPLASTY Left 06/10/2014   Procedure: SHOULDER ACROMIOPLASTY;  Surgeon: Vickki Hearing, MD;  Location: [facility];  Service: Orthopedics;  Laterality: Left;  . SHOULDER OPEN ROTATOR CUFF REPAIR Left 04/08/2014   Dr. Romeo Apple  . SHOULDER OPEN ROTATOR CUFF REPAIR Left 06/10/2014   Procedure: REVISION ROTATOR CUFF REPAIR SHOULDER OPEN WITH GRAFT;  Surgeon: Vickki Hearing, MD;  Location: [facility];  Service: Orthopedics;  Laterality: Left;  . SHOULDER OPEN ROTATOR CUFF REPAIR Right 10/29/2018   Procedure: OPEN ROTATOR CUFF REPAIR WITH ACHROMIOPLASTY;  Surgeon: Vickki Hearing, MD;  Location: [facility];  Service: Orthopedics;  Laterality: Right;  . TUBAL LIGATION      There were no vitals filed for this visit.  Subjective Assessment - 12/03/18 0946    Subjective  Reports more discomfort sunday and took muscle relaxer. Feels better today though.    Pertinent History  Degenerative arthritis, ankle, left shoulder and lumbar surgery, plate in left LE.    Patient Stated Goals  Use right arm again.    Currently in Pain?  Yes    Pain Score  5     Pain Location  Shoulder  Pain Orientation  Right    Pain Descriptors / Indicators  Discomfort    Pain Type  Surgical pain    Pain Onset  1 to 4 weeks ago    Pain Frequency  Intermittent         [facility] PT Assessment - 12/03/18 0001      Assessment   Medical Diagnosis  S/p right rotator cuff repair.    Referring Provider (PT)  Fuller Canada MD.    Onset Date/Surgical Date  10/29/18    Next MD Visit  12/10/2018      Restrictions   Weight Bearing Restrictions  No                   [facility] Adult PT Treatment/Exercise - 12/03/18 0001      Modalities   Modalities  Electrical Stimulation;Vasopneumatic      Electrical Stimulation   Electrical Stimulation Location  R shoulder    Electrical Stimulation Action  IFC    Electrical Stimulation Parameters   80-150 hz x15 min    Electrical Stimulation Goals  Pain      Vasopneumatic   Number Minutes Vasopneumatic   15 minutes    Vasopnuematic Location   Shoulder    Vasopneumatic Pressure  Low    Vasopneumatic Temperature   49      Manual Therapy   Manual Therapy  Passive ROM    Passive ROM  PROM of R shoulder into flex, ER, IR with gentle holds at end range               PT Short Term Goals - 11/15/18 1214      PT SHORT TERM GOAL #1   Title  STG's=LTG's.        PT Long Term Goals - 11/15/18 1216      PT LONG TERM GOAL #1   Title  Independent with a HEP    Time  8    Period  Weeks    Status  New      PT LONG TERM GOAL #2   Title  Active shoulder right flexion to 145 degrees so the patient can easily reach overhead.    Time  8    Period  Weeks    Status  New      PT LONG TERM GOAL #3   Title  Active ER to 70 degrees+ to allow for easily donning/doffing of apparel.    Time  8    Period  Weeks    Status  New      PT LONG TERM GOAL #4   Title  Increase ROM so patient is able to reach behind back.    Time  8    Period  Weeks    Status  New      PT LONG TERM GOAL #5   Title  Increase right shoulder strength to a solid 4+/5 to increase stability for performance of functional activities.    Time  8    Period  Weeks    Status  New      PT LONG TERM GOAL #6   Title  Perform ADL's with right shoulder pain not > 2-3/10.            Plan - 12/03/18 1026    Clinical Impression Statement  Patient presented in clinic with reports of more discomfort and using muscle relaxers recently. Patient more guarded with PROM of R shoulders in all directions and intermittant oscillations were  provided throughout PROM. Firm end feels and smooth arc of motion noted during PROM of R shoulder in all directions. Normal modalities response noted following removal of the modalities.    Rehab Potential  Good    PT Frequency  2x / week    PT Duration  8 weeks    PT  Treatment/Interventions  ADLs/Self Care Home Management;Cryotherapy;Electrical Stimulation;Therapeutic activities;Therapeutic exercise;Patient/family education;Manual techniques;Ultrasound;Vasopneumatic Device    PT Next Visit Plan  Please begin with right shoulder PROM with progression to AAROM.  Vasopneumatic and electrical stimulation.    Consulted and Agree with Plan of Care  Patient       Patient will benefit from skilled therapeutic intervention in order to improve the following deficits and impairments:  Pain, Decreased activity tolerance, Decreased range of motion  Visit Diagnosis: Acute pain of right shoulder  Stiffness of right shoulder, not elsewhere classified     Problem List Patient Active Problem List   Diagnosis Date Noted  . Headache syndrome 11/07/2018  . S/P right rotator cuff repair right 10/29/2018   . Spinal stenosis of lumbar region with neurogenic claudication 10/29/2017  . Tremor 09/11/2014  . Unspecified constipation 04/30/2014  . Rotator cuff (capsule) sprain 04/13/2014  . Rotator cuff tear, left 02/26/2014  . Memory deficit 02/25/2014  . Dizziness and giddiness 03/07/2013  . Headache 03/07/2013    Marvell FullerKelsey P , PTA 12/03/2018, 10:37 AM  [facility] [address] [place], [place], [address] Phone: [phone]   Fax:  [phone]  Name: Waldron LabsBeulah M Evon MRN: [id number] Date of Birth: 1946/10/03

## 2018-12-04 ENCOUNTER — Ambulatory Visit: Payer: Medicare Other | Admitting: Physical Therapy

## 2018-12-04 ENCOUNTER — Encounter: Payer: Self-pay | Admitting: Physical Therapy

## 2018-12-04 DIAGNOSIS — M25511 Pain in right shoulder: Secondary | ICD-10-CM | POA: Diagnosis not present

## 2018-12-04 DIAGNOSIS — M25611 Stiffness of right shoulder, not elsewhere classified: Secondary | ICD-10-CM

## 2018-12-04 NOTE — Therapy (Signed)
[facility] [address] [place], [place], [address] Phone: [phone]   Fax:  [phone]  Physical Therapy Treatment  Patient Details  Name: Beth Meza MRN: [id number] Date of Birth: [birth year] Referring Provider (PT): Fuller Canada MD.   Encounter Date: 12/04/2018  PT End of Session - 12/04/18 1133    Visit Number  7    Number of Visits  16    Date for PT Re-Evaluation  02/13/19    Authorization Type  FOTO AT LEAST EVERY 5TH VISIT.  KX MODIFIER AFTER 15 VISITS.  PROGRESS NOTE AT 10TH VISIT.     5th visit FOTO 56% limitation    PT Start Time  0945    PT Stop Time  1040    PT Time Calculation (min)  55 min    Activity Tolerance  Patient tolerated treatment well    Behavior During Therapy  WFL for tasks assessed/performed       Past Medical History:  Diagnosis Date  . Anemia   . Back spasm   . Degenerative arthritis   . Diabetes (HCC)    diet controlled  . Dizziness and giddiness 03/07/2013  . Dyslipidemia   . Gastroesophageal reflux disease   . Hand cramps   . Headache syndrome 11/07/2018  . Headache(784.0) 03/07/2013  . History of carpal tunnel syndrome    Bilateral  . HOH (hard of hearing)   . Hypertension   . Leg cramps   . Memory deficit 02/25/2014  . Seizures (HCC)    as child- unknown etiology-no meds    Past Surgical History:  Procedure Laterality Date  . ANKLE SURGERY Left    History of ankle fracture  . BACK SURGERY    . COLONOSCOPY N/A 05/04/2014   Procedure: COLONOSCOPY;  Surgeon: West Bali, MD;  [facility];  Service: Endoscopy;  Laterality: N/A;  11:30  . HAND SURGERY     Carpal tunnel syndrome, trigger fingers bilaterally  . KNEE SURGERY Left    Pin, history of fracture  . LUMBAR LAMINECTOMY/DECOMPRESSION MICRODISCECTOMY N/A 10/29/2017   Procedure: LUMBAR LAMINECTOMY AND FORAMINOTOMY LUMBAR THREE- LUMBAR FOUR, LUMBAR FOUR- LUMBAR FIVE;  Surgeon: Tressie Stalker, MD;  Location:  [facility] OR;  Service: Neurosurgery;  Laterality: N/A;  . plate in let leg    . SHOULDER ACROMIOPLASTY Left 06/10/2014   Procedure: SHOULDER ACROMIOPLASTY;  Surgeon: Vickki Hearing, MD;  Location: [facility];  Service: Orthopedics;  Laterality: Left;  . SHOULDER OPEN ROTATOR CUFF REPAIR Left 04/08/2014   Dr. Romeo Apple  . SHOULDER OPEN ROTATOR CUFF REPAIR Left 06/10/2014   Procedure: REVISION ROTATOR CUFF REPAIR SHOULDER OPEN WITH GRAFT;  Surgeon: Vickki Hearing, MD;  Location: [facility]S;  Service: Orthopedics;  Laterality: Left;  . SHOULDER OPEN ROTATOR CUFF REPAIR Right 10/29/2018   Procedure: OPEN ROTATOR CUFF REPAIR WITH ACHROMIOPLASTY;  Surgeon: Vickki Hearing, MD;  Location: [facility];  Service: Orthopedics;  Laterality: Right;  . TUBAL LIGATION      There were no vitals filed for this visit.  Subjective Assessment - 12/04/18 1119    Subjective  Shoulder still hurting.    Pertinent History  Degenerative arthritis, ankle, left shoulder and lumbar surgery, plate in left LE.    Patient Stated Goals  Use right arm again.    Currently in Pain?  Yes    Pain Score  5     Pain Location  Shoulder    Pain Orientation  Right  Pain Descriptors / Indicators  Discomfort    Pain Type  Surgical pain    Pain Onset  More than a month ago    Pain Frequency  Intermittent         [facility] PT Assessment - 12/04/18 0001      PROM   Overall PROM Comments  Flexion= 130 degrees and ER= 38 degrees.                   [facility] Adult PT Treatment/Exercise - 12/04/18 0001      Electrical Stimulation   Electrical Stimulation Location  Right shoulder.    Electrical Stimulation Action  IFC    Electrical Stimulation Parameters  80-150 Hz x 20 minutes.    Electrical Stimulation Goals  Pain      Vasopneumatic   Number Minutes Vasopneumatic   20 minutes    Vasopnuematic Location   --   Right shoulder.   Vasopneumatic Pressure  Low   Pillow between right elbow and thorax.     Manual Therapy   Manual  Therapy  Passive ROM    Passive ROM  PROM in supine to patient right shoulder with low load long duration stretching technqiue utilized x 23 minutes.               PT Short Term Goals - 11/15/18 1214      PT SHORT TERM GOAL #1   Title  STG's=LTG's.        PT Long Term Goals - 11/15/18 1216      PT LONG TERM GOAL #1   Title  Independent with a HEP    Time  8    Period  Weeks    Status  New      PT LONG TERM GOAL #2   Title  Active shoulder right flexion to 145 degrees so the patient can easily reach overhead.    Time  8    Period  Weeks    Status  New      PT LONG TERM GOAL #3   Title  Active ER to 70 degrees+ to allow for easily donning/doffing of apparel.    Time  8    Period  Weeks    Status  New      PT LONG TERM GOAL #4   Title  Increase ROM so patient is able to reach behind back.    Time  8    Period  Weeks    Status  New      PT LONG TERM GOAL #5   Title  Increase right shoulder strength to a solid 4+/5 to increase stability for performance of functional activities.    Time  8    Period  Weeks    Status  New      PT LONG TERM GOAL #6   Title  Perform ADL's with right shoulder pain not > 2-3/10.            Plan - 12/04/18 1144    Clinical Impression Statement  Very good progress regarding right shoulder passive range of motion with flexion= 130 degrees and ER= 38 degrees.    PT Treatment/Interventions  ADLs/Self Care Home Management;Cryotherapy;Electrical Stimulation;Therapeutic activities;Therapeutic exercise;Patient/family education;Manual techniques;Ultrasound;Vasopneumatic Device    PT Next Visit Plan  Please begin with right shoulder PROM with progression to AAROM.  Vasopneumatic and electrical stimulation.    Consulted and Agree with Plan of Care  Patient       Patient will  benefit from skilled therapeutic intervention in order to improve the following deficits and impairments:  Pain, Decreased activity tolerance, Decreased range of  motion  Visit Diagnosis: Acute pain of right shoulder  Stiffness of right shoulder, not elsewhere classified     Problem List Patient Active Problem List   Diagnosis Date Noted  . Headache syndrome 11/07/2018  . S/P right rotator cuff repair right 10/29/2018   . Spinal stenosis of lumbar region with neurogenic claudication 10/29/2017  . Tremor 09/11/2014  . Unspecified constipation 04/30/2014  . Rotator cuff (capsule) sprain 04/13/2014  . Rotator cuff tear, left 02/26/2014  . Memory deficit 02/25/2014  . Dizziness and giddiness 03/07/2013  . Headache 03/07/2013    , [place] MPT 12/04/2018, 11:46 AM  [facility] [address] [place], [place], [address] Phone: [phone]   Fax:  [phone]  Name: Beth Meza MRN: [id number] Date of Birth: April 02, 1946

## 2018-12-11 ENCOUNTER — Encounter: Payer: Self-pay | Admitting: Orthopedic Surgery

## 2018-12-11 ENCOUNTER — Ambulatory Visit (INDEPENDENT_AMBULATORY_CARE_PROVIDER_SITE_OTHER): Payer: Medicare Other | Admitting: Orthopedic Surgery

## 2018-12-11 ENCOUNTER — Other Ambulatory Visit: Payer: Self-pay

## 2018-12-11 VITALS — BP 127/76 | HR 92 | Ht 67.0 in | Wt 173.0 lb

## 2018-12-11 DIAGNOSIS — Z9889 Other specified postprocedural states: Secondary | ICD-10-CM

## 2018-12-11 MED ORDER — HYDROCODONE-ACETAMINOPHEN 5-325 MG PO TABS: 1.0000 | ORAL_TABLET | Freq: Three times a day (TID) | ORAL | 0 refills | Status: AC | PRN

## 2018-12-11 NOTE — Progress Notes (Signed)
Chief Complaint  Patient presents with  . Post-op Follow-up    right shoulder RCR 10/29/18    Encounter Diagnosis  Name Primary?  . S/P right rotator cuff repair right 10/29/2018 Yes    Doing well   Passive range of motion abduction 90 external rotation 30 flexion in the scapular plane 120  Skin looks clean wound looks good  Has some discomfort still with therapy  Recommend continue taper of hydrocodone new prescription continue therapy follow-up 6 weeks can remove sling use as needed  Meds ordered this encounter  Medications  . HYDROcodone-acetaminophen (NORCO/VICODIN) 5-325 MG tablet    Sig: Take 1 tablet by mouth every 8 (eight) hours as needed for up to 7 days for moderate pain.    Dispense:  21 tablet    Refill:  0

## 2018-12-11 NOTE — Patient Instructions (Signed)
Continue therapy   Return in 6 weeks   Sling as needed   Continue therapy

## 2018-12-16 ENCOUNTER — Ambulatory Visit: Payer: Medicare Other | Attending: Orthopedic Surgery | Admitting: Physical Therapy

## 2018-12-16 DIAGNOSIS — M25611 Stiffness of right shoulder, not elsewhere classified: Secondary | ICD-10-CM | POA: Diagnosis present

## 2018-12-16 DIAGNOSIS — M25511 Pain in right shoulder: Secondary | ICD-10-CM | POA: Diagnosis not present

## 2018-12-16 NOTE — Therapy (Signed)
[facility] [address] [place], [place], [address] Phone: [phone]   Fax:  [phone]  Physical Therapy Treatment  Patient Details  Name: Beth Meza MRN: [id number] Date of Birth: 05-Dec-1945 Referring Provider (PT): Fuller CanadaStanley Harrison MD.   Encounter Date: 12/16/2018  PT End of Session - 12/16/18 1008    Visit Number  8    Number of Visits  16    Date for PT Re-Evaluation  02/13/19    Authorization Type  FOTO AT LEAST EVERY 5TH VISIT.  KX MODIFIER AFTER 15 VISITS.  PROGRESS NOTE AT 10TH VISIT.     5th visit FOTO 56% limitation    PT Start Time  [phone]    PT Stop Time  1034    PT Time Calculation (min)  48 min    Activity Tolerance  Patient tolerated treatment well    Behavior During Therapy  WFL for tasks assessed/performed       Past Medical History:  Diagnosis Date  . Anemia   . Back spasm   . Degenerative arthritis   . Diabetes (HCC)    diet controlled  . Dizziness and giddiness 03/07/2013  . Dyslipidemia   . Gastroesophageal reflux disease   . Hand cramps   . Headache syndrome 11/07/2018  . Headache(784.0) 03/07/2013  . History of carpal tunnel syndrome    Bilateral  . HOH (hard of hearing)   . Hypertension   . Leg cramps   . Memory deficit 02/25/2014  . Seizures (HCC)    as child- unknown etiology-no meds    Past Surgical History:  Procedure Laterality Date  . ANKLE SURGERY Left    History of ankle fracture  . BACK SURGERY    . COLONOSCOPY N/A 05/04/2014   Procedure: COLONOSCOPY;  Surgeon: West BaliSandi L Fields, MD;  [facility];  Service: Endoscopy;  Laterality: N/A;  11:30  . HAND SURGERY     Carpal tunnel syndrome, trigger fingers bilaterally  . KNEE SURGERY Left    Pin, history of fracture  . LUMBAR LAMINECTOMY/DECOMPRESSION MICRODISCECTOMY N/A 10/29/2017   Procedure: LUMBAR LAMINECTOMY AND FORAMINOTOMY LUMBAR THREE- LUMBAR FOUR, LUMBAR FOUR- LUMBAR FIVE;  Surgeon: Tressie StalkerJenkins, Jeffrey, MD;  [facility];  Service: Neurosurgery;  Laterality: N/A;  . plate in let leg    . SHOULDER ACROMIOPLASTY Left 06/10/2014   Procedure: SHOULDER ACROMIOPLASTY;  Surgeon: Vickki HearingStanley E Harrison, MD;  Location: [facility];  Service: Orthopedics;  Laterality: Left;  . SHOULDER OPEN ROTATOR CUFF REPAIR Left 04/08/2014   Dr. Romeo AppleHarrison  . SHOULDER OPEN ROTATOR CUFF REPAIR Left 06/10/2014   Procedure: REVISION ROTATOR CUFF REPAIR SHOULDER OPEN WITH GRAFT;  Surgeon: Vickki HearingStanley E Harrison, MD;  Location: [facility]S;  Service: Orthopedics;  Laterality: Left;  . SHOULDER OPEN ROTATOR CUFF REPAIR Right 10/29/2018   Procedure: OPEN ROTATOR CUFF REPAIR WITH ACHROMIOPLASTY;  Surgeon: Vickki HearingHarrison, Stanley E, MD;  Location: [facility];  Service: Orthopedics;  Laterality: Right;  . TUBAL LIGATION      There were no vitals filed for this visit.  Subjective Assessment - 12/16/18 0951    Subjective  Patient reported seeing MD last week and doing well, some ongoing soreness in shoulder    Pertinent History  Degenerative arthritis, ankle, left shoulder and lumbar surgery, plate in left LE.    Patient Stated Goals  Use right arm again.    Currently in Pain?  Yes    Pain Score  5     Pain Location  Shoulder    Pain Orientation  Right    Pain Descriptors / Indicators  Discomfort    Pain Type  Surgical pain    Pain Onset  More than a month ago    Pain Frequency  Intermittent    Aggravating Factors   increased movement of shoulder    Pain Relieving Factors  at rest         [facility] PT Assessment - 12/16/18 0001      ROM / Strength   AROM / PROM / Strength  PROM      PROM   PROM Assessment Site  Shoulder    Right/Left Shoulder  Right    Right Shoulder Flexion  139 Degrees    Right Shoulder External Rotation  55 Degrees                   [facility] Adult PT Treatment/Exercise - 12/16/18 0001      Exercises   Exercises  Shoulder      Shoulder Exercises: Supine   Other Supine Exercises  supine cane for ER and overhead 2x10       Shoulder Exercises: Pulleys   Flexion  3 minutes    Other Pulley Exercises  seated UE ranger for circles x10min      Electrical Stimulation   Electrical Stimulation Location  Right shoulder.    Engineer, manufacturing  IFC    Electrical Stimulation Parameters  80-150hz  x62min    Electrical Stimulation Goals  Pain      Vasopneumatic   Number Minutes Vasopneumatic   15 minutes    Vasopnuematic Location   Shoulder    Vasopneumatic Pressure  Low      Manual Therapy   Manual Therapy  Passive ROM    Manual therapy comments  rhythmic stabs for IR/Er in scaption    Passive ROM  PROM in supine flexion, IR, ER to patient right shoulder with low load long duration stretching technqiue utilized x               PT Short Term Goals - 11/15/18 1214      PT SHORT TERM GOAL #1   Title  STG's=LTG's.        PT Long Term Goals - 12/16/18 1009      PT LONG TERM GOAL #1   Title  Independent with a HEP    Time  8    Period  Weeks    Status  On-going      PT LONG TERM GOAL #2   Title  Active shoulder right flexion to 145 degrees so the patient can easily reach overhead.    Time  8    Status  On-going      PT LONG TERM GOAL #3   Title  Active ER to 70 degrees+ to allow for easily donning/doffing of apparel.    Time  8    Period  Weeks    Status  On-going      PT LONG TERM GOAL #4   Title  Increase ROM so patient is able to reach behind back.    Time  8    Period  Weeks    Status  On-going      PT LONG TERM GOAL #5   Title  Increase right shoulder strength to a solid 4+/5 to increase stability for performance of functional activities.    Time  8    Period  Weeks    Status  On-going  PT LONG TERM GOAL #6   Title  Perform ADL's with right shoulder pain not > 2-3/10.    Time  8    Status  On-going            Plan - 12/16/18 1018    Clinical Impression Statement  Patient tolerated treatment well today. Progressed patient to active assistive today with no  difficulty or discomfort over 5/10 at most per reported. Patient has reported being able to put on deodorant, wash hair and donn/doff t-shirt with greater ease. Patient improved with PROM today. Goals progresing.    Rehab Potential  Good    PT Frequency  2x / week    PT Duration  8 weeks    PT Treatment/Interventions  ADLs/Self Care Home Management;Cryotherapy;Electrical Stimulation;Therapeutic activities;Therapeutic exercise;Patient/family education;Manual techniques;Ultrasound;Vasopneumatic Device    PT Next Visit Plan  cont with POC for PROM and  AAROM.  Vasopneumatic and electrical stimulation.    Consulted and Agree with Plan of Care  Patient       Patient will benefit from skilled therapeutic intervention in order to improve the following deficits and impairments:  Pain, Decreased activity tolerance, Decreased range of motion  Visit Diagnosis: Acute pain of right shoulder  Stiffness of right shoulder, not elsewhere classified     Problem List Patient Active Problem List   Diagnosis Date Noted  . Headache syndrome 11/07/2018  . S/P right rotator cuff repair right 10/29/2018   . Spinal stenosis of lumbar region with neurogenic claudication 10/29/2017  . Tremor 09/11/2014  . Unspecified constipation 04/30/2014  . Rotator cuff (capsule) sprain 04/13/2014  . Rotator cuff tear, left 02/26/2014  . Memory deficit 02/25/2014  . Dizziness and giddiness 03/07/2013  . Headache 03/07/2013    Hermelinda Dellen, PTA 12/16/2018, 10:34 AM  [facility] [address] [place], [place], [address] Phone: [phone]   Fax:  [phone]  Name: Beth Meza MRN: [id number] Date of Birth: 17-Jul-1946

## 2018-12-17 ENCOUNTER — Ambulatory Visit: Payer: Medicare Other | Admitting: *Deleted

## 2018-12-17 DIAGNOSIS — M25511 Pain in right shoulder: Secondary | ICD-10-CM | POA: Diagnosis not present

## 2018-12-17 DIAGNOSIS — M25611 Stiffness of right shoulder, not elsewhere classified: Secondary | ICD-10-CM

## 2018-12-17 NOTE — Therapy (Signed)
[facility] [address] [place], [place], [address] Phone: [phone]   Fax:  [phone]  Physical Therapy Treatment  Patient Details  Name: Beth Meza MRN: [id number] Date of Birth: May 29, 1946 Referring Provider (PT): Fuller Canada MD.   Encounter Date: 12/17/2018  PT End of Session - 12/17/18 1002    Visit Number  9    Number of Visits  16    Date for PT Re-Evaluation  02/13/19    Authorization Type  FOTO AT LEAST EVERY 5TH VISIT.  KX MODIFIER AFTER 15 VISITS.  PROGRESS NOTE AT 10TH VISIT.     5th visit FOTO 56% limitation    PT Start Time  0945    PT Stop Time  1040    PT Time Calculation (min)  55 min       Past Medical History:  Diagnosis Date  . Anemia   . Back spasm   . Degenerative arthritis   . Diabetes (HCC)    diet controlled  . Dizziness and giddiness 03/07/2013  . Dyslipidemia   . Gastroesophageal reflux disease   . Hand cramps   . Headache syndrome 11/07/2018  . Headache(784.0) 03/07/2013  . History of carpal tunnel syndrome    Bilateral  . HOH (hard of hearing)   . Hypertension   . Leg cramps   . Memory deficit 02/25/2014  . Seizures (HCC)    as child- unknown etiology-no meds    Past Surgical History:  Procedure Laterality Date  . ANKLE SURGERY Left    History of ankle fracture  . BACK SURGERY    . COLONOSCOPY N/A 05/04/2014   Procedure: COLONOSCOPY;  Surgeon: West Bali, MD;  [facility];  Service: Endoscopy;  Laterality: N/A;  11:30  . HAND SURGERY     Carpal tunnel syndrome, trigger fingers bilaterally  . KNEE SURGERY Left    Pin, history of fracture  . LUMBAR LAMINECTOMY/DECOMPRESSION MICRODISCECTOMY N/A 10/29/2017   Procedure: LUMBAR LAMINECTOMY AND FORAMINOTOMY LUMBAR THREE- LUMBAR FOUR, LUMBAR FOUR- LUMBAR FIVE;  Surgeon: Tressie Stalker, MD;  Location: [facility] OR;  Service: Neurosurgery;  Laterality: N/A;  . plate in let leg    . SHOULDER ACROMIOPLASTY Left 06/10/2014   Procedure: SHOULDER ACROMIOPLASTY;  Surgeon: Vickki Hearing, MD;  Location: [facility];  Service: Orthopedics;  Laterality: Left;  . SHOULDER OPEN ROTATOR CUFF REPAIR Left 04/08/2014   Dr. Romeo Apple  . SHOULDER OPEN ROTATOR CUFF REPAIR Left 06/10/2014   Procedure: REVISION ROTATOR CUFF REPAIR SHOULDER OPEN WITH GRAFT;  Surgeon: Vickki Hearing, MD;  Location: [facility];  Service: Orthopedics;  Laterality: Left;  . SHOULDER OPEN ROTATOR CUFF REPAIR Right 10/29/2018   Procedure: OPEN ROTATOR CUFF REPAIR WITH ACHROMIOPLASTY;  Surgeon: Vickki Hearing, MD;  Location: [facility];  Service: Orthopedics;  Laterality: Right;  . TUBAL LIGATION      There were no vitals filed for this visit.  Subjective Assessment - 12/17/18 0958    Subjective  RT shldr did great after last Rx. Sore today.    Pertinent History  Degenerative arthritis, ankle, left shoulder and lumbar surgery, plate in left LE.    Patient Stated Goals  Use right arm again.    Currently in Pain?  Yes    Pain Score  4     Pain Location  Shoulder    Pain Orientation  Right    Pain Descriptors / Indicators  Discomfort    Pain Type  Surgical pain  Pain Onset  More than a month ago    Pain Frequency  Intermittent                       [facility] Adult PT Treatment/Exercise - 12/17/18 0001      Exercises   Exercises  Shoulder      Shoulder Exercises: Supine   Other Supine Exercises  supine cane for ER and overhead 2x10      Shoulder Exercises: Pulleys   Flexion  5 minutes    Other Pulley Exercises  seated UE ranger for flexion and circles x      Electrical Stimulation   Electrical Stimulation Location  Right shoulder. IFC 80-150hz  x 15 mins    Electrical Stimulation Goals  Pain      Vasopneumatic   Number Minutes Vasopneumatic   15 minutes    Vasopnuematic Location   Shoulder    Vasopneumatic Pressure  Low    Vasopneumatic Temperature   36      Manual Therapy   Manual Therapy  Passive ROM    Manual therapy  comments  rhythmic stabs for IR/Er in scaption    Passive ROM  PROM in supine flexion, IR, ER to patient right shoulder with low load long duration stretching technqiue utilized                PT Short Term Goals - 11/15/18 1214      PT SHORT TERM GOAL #1   Title  STG's=LTG's.        PT Long Term Goals - 12/16/18 1009      PT LONG TERM GOAL #1   Title  Independent with a HEP    Time  8    Period  Weeks    Status  On-going      PT LONG TERM GOAL #2   Title  Active shoulder right flexion to 145 degrees so the patient can easily reach overhead.    Time  8    Status  On-going      PT LONG TERM GOAL #3   Title  Active ER to 70 degrees+ to allow for easily donning/doffing of apparel.    Time  8    Period  Weeks    Status  On-going      PT LONG TERM GOAL #4   Title  Increase ROM so patient is able to reach behind back.    Time  8    Period  Weeks    Status  On-going      PT LONG TERM GOAL #5   Title  Increase right shoulder strength to a solid 4+/5 to increase stability for performance of functional activities.    Time  8    Period  Weeks    Status  On-going      PT LONG TERM GOAL #6   Title  Perform ADL's with right shoulder pain not > 2-3/10.    Time  8    Status  On-going            Plan - 12/17/18 1034    Clinical Impression Statement  Pt arrived today reporting doing very well after last visit. She did have increased soreness today, but was able to perform all AAROM exs and tolerated PROM . Continue to progress as tolerated. Normal modality response today.    Rehab Potential  Good    PT Frequency  2x / week    PT Duration  8 weeks    PT Treatment/Interventions  ADLs/Self Care Home Management;Cryotherapy;Electrical Stimulation;Therapeutic activities;Therapeutic exercise;Patient/family education;Manual techniques;Ultrasound;Vasopneumatic Device    PT Next Visit Plan  cont with POC for PROM and  AAROM.  Vasopneumatic and electrical stimulation.     Consulted and Agree with Plan of Care  Patient       Patient will benefit from skilled therapeutic intervention in order to improve the following deficits and impairments:  Pain, Decreased activity tolerance, Decreased range of motion  Visit Diagnosis: Acute pain of right shoulder  Stiffness of right shoulder, not elsewhere classified     Problem List Patient Active Problem List   Diagnosis Date Noted  . Headache syndrome 11/07/2018  . S/P right rotator cuff repair right 10/29/2018   . Spinal stenosis of lumbar region with neurogenic claudication 10/29/2017  . Tremor 09/11/2014  . Unspecified constipation 04/30/2014  . Rotator cuff (capsule) sprain 04/13/2014  . Rotator cuff tear, left 02/26/2014  . Memory deficit 02/25/2014  . Dizziness and giddiness 03/07/2013  . Headache 03/07/2013    RAMSEUR,CHRIS , PTA 12/17/2018, 12:59 PM  [facility] [address] [place], [place], [address] Phone: [phone]   Fax:  [phone]  Name: Beth Meza MRN: [id number] Date of Birth: [birth year]

## 2018-12-24 ENCOUNTER — Other Ambulatory Visit: Payer: Self-pay

## 2018-12-24 ENCOUNTER — Ambulatory Visit: Payer: Medicare Other | Admitting: *Deleted

## 2018-12-24 DIAGNOSIS — M25511 Pain in right shoulder: Secondary | ICD-10-CM

## 2018-12-24 DIAGNOSIS — M25611 Stiffness of right shoulder, not elsewhere classified: Secondary | ICD-10-CM

## 2018-12-24 NOTE — Therapy (Signed)
[facility] [address] [place], [place], [address] Phone: [phone]   Fax:  [phone]  Physical Therapy Treatment  Patient Details  Name: Beth Meza MRN: [id number] Date of Birth: 11-Mar-1946 Referring Provider (PT): Fuller Canada MD.   Encounter Date: 12/24/2018  PT End of Session - 12/24/18 0950    Visit Number  10    Number of Visits  16    Date for PT Re-Evaluation  02/13/19    Authorization Type  FOTO AT LEAST EVERY 5TH VISIT.  KX MODIFIER AFTER 15 VISITS.  PROGRESS NOTE AT 10TH VISIT.     5th visit FOTO 56% limitation    PT Start Time  0945    PT Stop Time  1044    PT Time Calculation (min)  59 min       Past Medical History:  Diagnosis Date  . Anemia   . Back spasm   . Degenerative arthritis   . Diabetes (HCC)    diet controlled  . Dizziness and giddiness 03/07/2013  . Dyslipidemia   . Gastroesophageal reflux disease   . Hand cramps   . Headache syndrome 11/07/2018  . Headache(784.0) 03/07/2013  . History of carpal tunnel syndrome    Bilateral  . HOH (hard of hearing)   . Hypertension   . Leg cramps   . Memory deficit 02/25/2014  . Seizures (HCC)    as child- unknown etiology-no meds    Past Surgical History:  Procedure Laterality Date  . ANKLE SURGERY Left    History of ankle fracture  . BACK SURGERY    . COLONOSCOPY N/A 05/04/2014   Procedure: COLONOSCOPY;  Surgeon: West Bali, MD;  [facility];  Service: Endoscopy;  Laterality: N/A;  11:30  . HAND SURGERY     Carpal tunnel syndrome, trigger fingers bilaterally  . KNEE SURGERY Left    Pin, history of fracture  . LUMBAR LAMINECTOMY/DECOMPRESSION MICRODISCECTOMY N/A 10/29/2017   Procedure: LUMBAR LAMINECTOMY AND FORAMINOTOMY LUMBAR THREE- LUMBAR FOUR, LUMBAR FOUR- LUMBAR FIVE;  Surgeon: Tressie Stalker, MD;  Location: [facility] OR;  Service: Neurosurgery;  Laterality: N/A;  . plate in let leg    . SHOULDER ACROMIOPLASTY Left 06/10/2014   Procedure: SHOULDER ACROMIOPLASTY;  Surgeon: Vickki Hearing, MD;  Location: [facility];  Service: Orthopedics;  Laterality: Left;  . SHOULDER OPEN ROTATOR CUFF REPAIR Left 04/08/2014   Dr. Romeo Apple  . SHOULDER OPEN ROTATOR CUFF REPAIR Left 06/10/2014   Procedure: REVISION ROTATOR CUFF REPAIR SHOULDER OPEN WITH GRAFT;  Surgeon: Vickki Hearing, MD;  Location: [facility];  Service: Orthopedics;  Laterality: Left;  . SHOULDER OPEN ROTATOR CUFF REPAIR Right 10/29/2018   Procedure: OPEN ROTATOR CUFF REPAIR WITH ACHROMIOPLASTY;  Surgeon: Vickki Hearing, MD;  Location: [facility];  Service: Orthopedics;  Laterality: Right;  . TUBAL LIGATION      There were no vitals filed for this visit.  Subjective Assessment - 12/24/18 0948    Subjective  RT shldr did great after last Rx. Sore today due to the weather.    Pertinent History  Degenerative arthritis, ankle, left shoulder and lumbar surgery, plate in left LE.    Patient Stated Goals  Use right arm again.    Currently in Pain?  Yes    Pain Score  6     Pain Location  Shoulder    Pain Orientation  Right    Pain Descriptors / Indicators  Discomfort    Pain Type  Surgical pain    Pain Onset  More than a month ago    Pain Frequency  Intermittent                       [facility] Adult PT Treatment/Exercise - 12/24/18 0001      Shoulder Exercises: Supine   Other Supine Exercises  supine cane for ER and overhead 2x10      Shoulder Exercises: Pulleys   Flexion  5 minutes    Other Pulley Exercises  seated UE ranger for flexion and circles x      Electrical Stimulation   Electrical Stimulation Location  Right shoulder. IFC 80-150hz  x 15 mins    Electrical Stimulation Goals  Pain      Vasopneumatic   Number Minutes Vasopneumatic   15 minutes    Vasopnuematic Location   Shoulder    Vasopneumatic Pressure  Low    Vasopneumatic Temperature   36      Manual Therapy   Manual Therapy  Passive ROM    Manual therapy comments  rhythmic  stabs for IR/Er in scaption    Passive ROM  PROM in supine flexion, IR, ER to patient right shoulder with low load long duration stretching technqiue utilized                PT Short Term Goals - 11/15/18 1214      PT SHORT TERM GOAL #1   Title  STG's=LTG's.        PT Long Term Goals - 12/16/18 1009      PT LONG TERM GOAL #1   Title  Independent with a HEP    Time  8    Period  Weeks    Status  On-going      PT LONG TERM GOAL #2   Title  Active shoulder right flexion to 145 degrees so the patient can easily reach overhead.    Time  8    Status  On-going      PT LONG TERM GOAL #3   Title  Active ER to 70 degrees+ to allow for easily donning/doffing of apparel.    Time  8    Period  Weeks    Status  On-going      PT LONG TERM GOAL #4   Title  Increase ROM so patient is able to reach behind back.    Time  8    Period  Weeks    Status  On-going      PT LONG TERM GOAL #5   Title  Increase right shoulder strength to a solid 4+/5 to increase stability for performance of functional activities.    Time  8    Period  Weeks    Status  On-going      PT LONG TERM GOAL #6   Title  Perform ADL's with right shoulder pain not > 2-3/10.    Time  8    Status  On-going            Plan - 12/24/18 1610    Clinical Impression Statement  Pt arrived today doing fairly well, but with some RT shldr soreness and tightness. Her ROM was ER 60 degrees and Flexion 125 degrees. She did well again with AAROM and PROM for LT shldr. 10th visit FOTO was 44%    Rehab Potential  Good    PT Frequency  2x / week    PT Duration  8 weeks  PT Treatment/Interventions  ADLs/Self Care Home Management;Cryotherapy;Electrical Stimulation;Therapeutic activities;Therapeutic exercise;Patient/family education;Manual techniques;Ultrasound;Vasopneumatic Device    PT Next Visit Plan  cont with POC for PROM and  AAROM.  Vasopneumatic and electrical stimulation.    Consulted and Agree with Plan of Care   Patient       Patient will benefit from skilled therapeutic intervention in order to improve the following deficits and impairments:  Pain, Decreased activity tolerance, Decreased range of motion  Visit Diagnosis: Acute pain of right shoulder  Stiffness of right shoulder, not elsewhere classified     Problem List Patient Active Problem List   Diagnosis Date Noted  . Headache syndrome 11/07/2018  . S/P right rotator cuff repair right 10/29/2018   . Spinal stenosis of lumbar region with neurogenic claudication 10/29/2017  . Tremor 09/11/2014  . Unspecified constipation 04/30/2014  . Rotator cuff (capsule) sprain 04/13/2014  . Rotator cuff tear, left 02/26/2014  . Memory deficit 02/25/2014  . Dizziness and giddiness 03/07/2013  . Headache 03/07/2013    ,CHRIS, PTA 12/24/2018, 1:59 PM  [facility] [address] [place], [place], [address] Phone: [phone]   Fax:  [phone]  Name: Beth Meza MRN: [id number] Date of Birth: December 08, 1945

## 2018-12-25 ENCOUNTER — Ambulatory Visit: Payer: Medicare Other | Admitting: Physical Therapy

## 2018-12-25 ENCOUNTER — Other Ambulatory Visit: Payer: Self-pay

## 2018-12-25 DIAGNOSIS — M25511 Pain in right shoulder: Secondary | ICD-10-CM | POA: Diagnosis not present

## 2018-12-25 DIAGNOSIS — M25611 Stiffness of right shoulder, not elsewhere classified: Secondary | ICD-10-CM

## 2018-12-25 NOTE — Therapy (Signed)
[facility] [address] [place], [place], [address] Phone: [phone]   Fax:  [phone]  Physical Therapy Treatment  Patient Details  Name: Beth Meza MRN: [id number] Date of Birth: 1946/01/14 Referring Provider (PT): Meza Canada MD.   Encounter Date: 12/25/2018  PT End of Session - 12/25/18 0951    Visit Number  11    Number of Visits  16    Date for PT Re-Evaluation  02/13/19    Authorization Type  FOTO AT LEAST EVERY 5TH VISIT.  KX MODIFIER AFTER 15 VISITS.  PROGRESS NOTE AT 10TH VISIT.     5th visit FOTO 56% limitation    PT Start Time  [phone]    PT Stop Time  1034    PT Time Calculation (min)  46 min    Activity Tolerance  Patient tolerated treatment well    Behavior During Therapy  WFL for tasks assessed/performed       Past Medical History:  Diagnosis Date  . Anemia   . Back spasm   . Degenerative arthritis   . Diabetes (HCC)    diet controlled  . Dizziness and giddiness 03/07/2013  . Dyslipidemia   . Gastroesophageal reflux disease   . Hand cramps   . Headache syndrome 11/07/2018  . Headache(784.0) 03/07/2013  . History of carpal tunnel syndrome    Bilateral  . HOH (hard of hearing)   . Hypertension   . Leg cramps   . Memory deficit 02/25/2014  . Seizures (HCC)    as child- unknown etiology-no meds    Past Surgical History:  Procedure Laterality Date  . ANKLE SURGERY Left    History of ankle fracture  . BACK SURGERY    . COLONOSCOPY N/A 05/04/2014   Procedure: COLONOSCOPY;  Surgeon: West Bali, MD;  [facility];  Service: Endoscopy;  Laterality: N/A;  11:30  . HAND SURGERY     Carpal tunnel syndrome, trigger fingers bilaterally  . KNEE SURGERY Left    Pin, history of fracture  . LUMBAR LAMINECTOMY/DECOMPRESSION MICRODISCECTOMY N/A 10/29/2017   Procedure: LUMBAR LAMINECTOMY AND FORAMINOTOMY LUMBAR THREE- LUMBAR FOUR, LUMBAR FOUR- LUMBAR FIVE;  Surgeon: Tressie Stalker, MD;   Location: [facility] OR;  Service: Neurosurgery;  Laterality: N/A;  . plate in let leg    . SHOULDER ACROMIOPLASTY Left 06/10/2014   Procedure: SHOULDER ACROMIOPLASTY;  Surgeon: Vickki Hearing, MD;  Location: [facility];  Service: Orthopedics;  Laterality: Left;  . SHOULDER OPEN ROTATOR CUFF REPAIR Left 04/08/2014   Dr. Romeo Apple  . SHOULDER OPEN ROTATOR CUFF REPAIR Left 06/10/2014   Procedure: REVISION ROTATOR CUFF REPAIR SHOULDER OPEN WITH GRAFT;  Surgeon: Vickki Hearing, MD;  Location: [facility];  Service: Orthopedics;  Laterality: Left;  . SHOULDER OPEN ROTATOR CUFF REPAIR Right 10/29/2018   Procedure: OPEN ROTATOR CUFF REPAIR WITH ACHROMIOPLASTY;  Surgeon: Vickki Hearing, MD;  Location: [facility];  Service: Orthopedics;  Laterality: Right;  . TUBAL LIGATION      There were no vitals filed for this visit.  Subjective Assessment - 12/25/18 0945    Subjective  Reports tenderness in her R shoulder. Reports that she accidently picked up a 64 oz soda the other day.    Pertinent History  Degenerative arthritis, ankle, left shoulder and lumbar surgery, plate in left LE.    Patient Stated Goals  Use right arm again.    Currently in Pain?  Yes    Pain Score  6  Pain Location  Shoulder    Pain Orientation  Right    Pain Descriptors / Indicators  Tender    Pain Type  Surgical pain    Pain Onset  More than a month ago    Pain Frequency  Constant         [facility] PT Assessment - 12/25/18 0001      Assessment   Medical Diagnosis  S/p right rotator cuff repair.    Referring Provider (PT)  Meza Canada MD.    Onset Date/Surgical Date  10/29/18    Next MD Visit  12/10/2018      Restrictions   Weight Bearing Restrictions  No                   [facility] Adult PT Treatment/Exercise - 12/25/18 0001      Shoulder Exercises: Supine   External Rotation  AAROM;Right;20 reps    Flexion  AAROM;Both;20 reps      Shoulder Exercises: Pulleys   Flexion  5 minutes      Shoulder Exercises:  ROM/Strengthening   Ranger  UE ranger flex, CW and CCW x5 min total   in sitting     Modalities   Modalities  Electrical Stimulation;Vasopneumatic      Electrical Stimulation   Electrical Stimulation Location  R shoulder    Electrical Stimulation Action  Pre-Mod    Electrical Stimulation Parameters  80-150 hz x15 min    Electrical Stimulation Goals  Pain      Vasopneumatic   Number Minutes Vasopneumatic   15 minutes    Vasopnuematic Location   Shoulder    Vasopneumatic Pressure  Low    Vasopneumatic Temperature   63      Manual Therapy   Manual Therapy  Passive ROM    Passive ROM  PROM of R shoulder into flexion, ER, IR with gentle holds at end range               PT Short Term Goals - 11/15/18 1214      PT SHORT TERM GOAL #1   Title  STG's=LTG's.        PT Long Term Goals - 12/16/18 1009      PT LONG TERM GOAL #1   Title  Independent with a HEP    Time  8    Period  Weeks    Status  On-going      PT LONG TERM GOAL #2   Title  Active shoulder right flexion to 145 degrees so the patient can easily reach overhead.    Time  8    Status  On-going      PT LONG TERM GOAL #3   Title  Active ER to 70 degrees+ to allow for easily donning/doffing of apparel.    Time  8    Period  Weeks    Status  On-going      PT LONG TERM GOAL #4   Title  Increase ROM so patient is able to reach behind back.    Time  8    Period  Weeks    Status  On-going      PT LONG TERM GOAL #5   Title  Increase right shoulder strength to a solid 4+/5 to increase stability for performance of functional activities.    Time  8    Period  Weeks    Status  On-going      PT LONG TERM GOAL #6   Title  Perform ADL's with right shoulder pain not > 2-3/10.    Time  8    Status  On-going            Plan - 12/25/18 1045    Clinical Impression Statement  Patient presented in clinic with reports of tenderness in R mid humerus. Patient able to complete AAROM exercises with tactile and  VCs for technique improvements especially with ER. Patient understanding of all instructions provided with exercises. Firm end feels and smooth arc of motion noted during PROM of R shoulder in all directions. Normal modalities response noted following removal of the modalities.    Rehab Potential  Good    PT Frequency  2x / week    PT Duration  8 weeks    PT Treatment/Interventions  ADLs/Self Care Home Management;Cryotherapy;Electrical Stimulation;Therapeutic activities;Therapeutic exercise;Patient/family education;Manual techniques;Ultrasound;Vasopneumatic Device    PT Next Visit Plan  cont with POC for PROM and  AAROM.  Vasopneumatic and electrical stimulation.    Consulted and Agree with Plan of Care  Patient       Patient will benefit from skilled therapeutic intervention in order to improve the following deficits and impairments:  Pain, Decreased activity tolerance, Decreased range of motion  Visit Diagnosis: Acute pain of right shoulder  Stiffness of right shoulder, not elsewhere classified     Problem List Patient Active Problem List   Diagnosis Date Noted  . Headache syndrome 11/07/2018  . S/P right rotator cuff repair right 10/29/2018   . Spinal stenosis of lumbar region with neurogenic claudication 10/29/2017  . Tremor 09/11/2014  . Unspecified constipation 04/30/2014  . Rotator cuff (capsule) sprain 04/13/2014  . Rotator cuff tear, left 02/26/2014  . Memory deficit 02/25/2014  . Dizziness and giddiness 03/07/2013  . Headache 03/07/2013    Beth Meza, Beth Meza 12/25/2018, 11:55 AM  [facility] [address] [place], [place], [address] Phone: [phone]   Fax:  [phone]  Name: Beth Meza MRN: [id number] Date of Birth: 1946/04/26

## 2018-12-31 ENCOUNTER — Ambulatory Visit: Payer: Medicare Other | Admitting: Physical Therapy

## 2019-01-03 ENCOUNTER — Encounter: Payer: Medicaid Other | Admitting: Physical Therapy

## 2019-01-06 ENCOUNTER — Encounter: Payer: Self-pay | Admitting: Physical Therapy

## 2019-01-06 ENCOUNTER — Other Ambulatory Visit: Payer: Self-pay

## 2019-01-06 ENCOUNTER — Ambulatory Visit: Payer: Medicare Other | Admitting: Physical Therapy

## 2019-01-06 DIAGNOSIS — M25511 Pain in right shoulder: Secondary | ICD-10-CM | POA: Diagnosis not present

## 2019-01-06 DIAGNOSIS — M25611 Stiffness of right shoulder, not elsewhere classified: Secondary | ICD-10-CM

## 2019-01-06 NOTE — Therapy (Addendum)
[facility] [address] [place], [place], [address] Phone: [phone]   Fax:  [phone]  Physical Therapy Treatment  Patient Details  Name: Beth Meza MRN: [id number] Date of Birth: 1945-11-18 Referring Provider (PT): Fuller Canada MD.   Encounter Date: 01/06/2019  PT End of Session - 01/06/19 1313    Visit Number  12    Number of Visits  16    Date for PT Re-Evaluation  02/13/19    Authorization Type  FOTO AT LEAST EVERY 5TH VISIT.  KX MODIFIER AFTER 15 VISITS.  PROGRESS NOTE AT 10TH VISIT.     5th visit FOTO 56% limitation    PT Start Time  1259    PT Stop Time  1348    PT Time Calculation (min)  49 min    Activity Tolerance  Patient tolerated treatment well    Behavior During Therapy  WFL for tasks assessed/performed       Past Medical History:  Diagnosis Date  . Anemia   . Back spasm   . Degenerative arthritis   . Diabetes (HCC)    diet controlled  . Dizziness and giddiness 03/07/2013  . Dyslipidemia   . Gastroesophageal reflux disease   . Hand cramps   . Headache syndrome 11/07/2018  . Headache(784.0) 03/07/2013  . History of carpal tunnel syndrome    Bilateral  . HOH (hard of hearing)   . Hypertension   . Leg cramps   . Memory deficit 02/25/2014  . Seizures (HCC)    as child- unknown etiology-no meds    Past Surgical History:  Procedure Laterality Date  . ANKLE SURGERY Left    History of ankle fracture  . BACK SURGERY    . COLONOSCOPY N/A 05/04/2014   Procedure: COLONOSCOPY;  Surgeon: West Bali, MD;  [facility];  Service: Endoscopy;  Laterality: N/A;  11:30  . HAND SURGERY     Carpal tunnel syndrome, trigger fingers bilaterally  . KNEE SURGERY Left    Pin, history of fracture  . LUMBAR LAMINECTOMY/DECOMPRESSION MICRODISCECTOMY N/A 10/29/2017   Procedure: LUMBAR LAMINECTOMY AND FORAMINOTOMY LUMBAR THREE- LUMBAR FOUR, LUMBAR FOUR- LUMBAR FIVE;  Surgeon: Tressie Stalker, MD;   Location: [facility] OR;  Service: Neurosurgery;  Laterality: N/A;  . plate in let leg    . SHOULDER ACROMIOPLASTY Left 06/10/2014   Procedure: SHOULDER ACROMIOPLASTY;  Surgeon: Vickki Hearing, MD;  Location: [facility];  Service: Orthopedics;  Laterality: Left;  . SHOULDER OPEN ROTATOR CUFF REPAIR Left 04/08/2014   Dr. Romeo [facility]  . SHOULDER OPEN ROTATOR CUFF REPAIR Left 06/10/2014   Procedure: REVISION ROTATOR CUFF REPAIR SHOULDER OPEN WITH GRAFT;  Surgeon: Vickki Hearing, MD;  Location: [facility];  Service: Orthopedics;  Laterality: Left;  . SHOULDER OPEN ROTATOR CUFF REPAIR Right 10/29/2018   Procedure: OPEN ROTATOR CUFF REPAIR WITH ACHROMIOPLASTY;  Surgeon: Vickki Hearing, MD;  Location: [facility];  Service: Orthopedics;  Laterality: Right;  . TUBAL LIGATION      There were no vitals filed for this visit.  Subjective Assessment - 01/06/19 1300    Subjective  Reports muscular pain in R shoulder and has been taking muscle relaxers. COVID 19 screening performed on patient prior to entering building.    Patient is accompained by:  Family member    Pertinent History  Degenerative arthritis, ankle, left shoulder and lumbar surgery, plate in left LE.    Patient Stated Goals  Use right arm again.    Currently  in Pain?  Yes    Pain Score  8     Pain Location  Shoulder    Pain Orientation  Right    Pain Descriptors / Indicators  Discomfort    Pain Type  Surgical pain    Pain Onset  More than a month ago         [facility] PT Assessment - 01/06/19 0001      Assessment   Medical Diagnosis  S/p right rotator cuff repair.    Referring Provider (PT)  Fuller Canada MD.    Onset Date/Surgical Date  10/29/18    Next MD Visit  01/2019      Restrictions   Weight Bearing Restrictions  No                   [facility] Adult PT Treatment/Exercise - 01/06/19 0001      Shoulder Exercises: Supine   Protraction  AAROM;Both;20 reps    External Rotation  AAROM;Right;20 reps    Flexion  AAROM;Both;20  reps      Shoulder Exercises: Pulleys   Flexion  5 minutes      Shoulder Exercises: ROM/Strengthening   Ranger  UE ranger in standing flex x20 reps, CW and CCW circles x10 reps    Wall Wash  into flexion x8 reps; fatigued      Modalities   Modalities  Electrical Stimulation;Vasopneumatic      Electrical Stimulation   Electrical Stimulation Location  R shoulder    Electrical Stimulation Action  Pre-Mod    Electrical Stimulation Parameters  80-150 hz x15 min    Electrical Stimulation Goals  Pain      Vasopneumatic   Number Minutes Vasopneumatic   15 minutes    Vasopnuematic Location   Shoulder    Vasopneumatic Pressure  Low    Vasopneumatic Temperature   48      Manual Therapy   Manual Therapy  Passive ROM    Passive ROM  PROM of R shoulder into flexion, ER, IR with gentle holds at end range               PT Short Term Goals - 11/15/18 1214      PT SHORT TERM GOAL #1   Title  STG's=LTG's.        PT Long Term Goals - 12/16/18 1009      PT LONG TERM GOAL #1   Title  Independent with a HEP    Time  8    Period  Weeks    Status  On-going      PT LONG TERM GOAL #2   Title  Active shoulder right flexion to 145 degrees so the patient can easily reach overhead.    Time  8    Status  On-going      PT LONG TERM GOAL #3   Title  Active ER to 70 degrees+ to allow for easily donning/doffing of apparel.    Time  8    Period  Weeks    Status  On-going      PT LONG TERM GOAL #4   Title  Increase ROM so patient is able to reach behind back.    Time  8    Period  Weeks    Status  On-going      PT LONG TERM GOAL #5   Title  Increase right shoulder strength to a solid 4+/5 to increase stability for performance of functional activities.    Time  8    Period  Weeks    Status  On-going      PT LONG TERM GOAL #6   Title  Perform ADL's with right shoulder pain not > 2-3/10.    Time  8    Status  On-going            Plan - 01/06/19 1340    Clinical  Impression Statement  Patient tolerated today's treatment well although she continues to report R bicep discomfort. Patient required VCs to perform exercises in slow and controlled motion to avoid any excessive pain. Patient provided pulley system equiptment handout so she can purchase the items so clinic staff could make the pulley system for her use at home. Firm end feels and smooth arc of motion noted during PROM of R shoulder in all directions. Normal modalities response noted following removal of the modalities.    Rehab Potential  Good    PT Frequency  2x / week    PT Duration  8 weeks    PT Treatment/Interventions  ADLs/Self Care Home Management;Cryotherapy;Electrical Stimulation;Therapeutic activities;Therapeutic exercise;Patient/family education;Manual techniques;Ultrasound;Vasopneumatic Device    PT Next Visit Plan  cont with POC for PROM and  AAROM.  Vasopneumatic and electrical stimulation.    Consulted and Agree with Plan of Care  Patient       Patient will benefit from skilled therapeutic intervention in order to improve the following deficits and impairments:  Pain, Decreased activity tolerance, Decreased range of motion  Visit Diagnosis: Acute pain of right shoulder  Stiffness of right shoulder, not elsewhere classified     Problem List Patient Active Problem List   Diagnosis Date Noted  . Headache syndrome 11/07/2018  . S/P right rotator cuff repair right 10/29/2018   . Spinal stenosis of lumbar region with neurogenic claudication 10/29/2017  . Tremor 09/11/2014  . Unspecified constipation 04/30/2014  . Rotator cuff (capsule) sprain 04/13/2014  . Rotator cuff tear, left 02/26/2014  . Memory deficit 02/25/2014  . Dizziness and giddiness 03/07/2013  . Headache 03/07/2013    Marvell Fuller, PTA 01/06/2019, 2:13 PM  [facility] [address] [place], [place], [address] Phone: [phone]   Fax:  [phone]  Name:  JING AYRE MRN: [id number] Date of Birth: 08/09/1946

## 2019-01-08 ENCOUNTER — Ambulatory Visit: Payer: Medicare Other | Attending: Orthopedic Surgery | Admitting: Physical Therapy

## 2019-01-08 ENCOUNTER — Other Ambulatory Visit: Payer: Self-pay

## 2019-01-08 ENCOUNTER — Encounter: Payer: Self-pay | Admitting: Physical Therapy

## 2019-01-08 DIAGNOSIS — M25611 Stiffness of right shoulder, not elsewhere classified: Secondary | ICD-10-CM

## 2019-01-08 DIAGNOSIS — M25511 Pain in right shoulder: Secondary | ICD-10-CM

## 2019-01-08 NOTE — Therapy (Addendum)
[facility] [place], [place], [address] Phone: [phone]   Fax:  [phone]  Physical Therapy Treatment  Patient Details  Name: Beth Meza MRN: [id number] Date of Birth: [birth year] Referring Provider (PT): Arther Abbott MD.   Encounter Date: 01/08/2019  PT End of Session - 01/08/19 1026    Visit Number  13    Number of Visits  16    Date for PT Re-Evaluation  02/13/19    Authorization Type  FOTO AT LEAST EVERY 5TH VISIT.  KX MODIFIER AFTER 15 VISITS.  PROGRESS NOTE AT 10TH VISIT.     5th visit FOTO 56% limitation    PT Start Time  0940    PT Stop Time  1039    PT Time Calculation (min)  59 min    Activity Tolerance  Patient tolerated treatment well    Behavior During Therapy  WFL for tasks assessed/performed       Past Medical History:  Diagnosis Date  . Anemia   . Back spasm   . Degenerative arthritis   . Diabetes ([place])    diet controlled  . Dizziness and giddiness 03/07/2013  . Dyslipidemia   . Gastroesophageal reflux disease   . Hand cramps   . Headache syndrome 11/07/2018  . Headache(784.0) 03/07/2013  . History of carpal tunnel syndrome    Bilateral  . HOH (hard of hearing)   . Hypertension   . Leg cramps   . Memory deficit 02/25/2014  . Seizures ([place])    as child- unknown etiology-no meds    Past Surgical History:  Procedure Laterality Date  . ANKLE SURGERY Left    History of ankle fracture  . BACK SURGERY    . COLONOSCOPY N/A 05/04/2014   Procedure: COLONOSCOPY;  Surgeon: Danie Binder, MD;  [facility];  Service: Endoscopy;  Laterality: N/A;  11:30  . HAND SURGERY     Carpal tunnel syndrome, trigger fingers bilaterally  . KNEE SURGERY Left    Pin, history of fracture  . LUMBAR LAMINECTOMY/DECOMPRESSION MICRODISCECTOMY N/A 10/29/2017   Procedure: LUMBAR LAMINECTOMY AND FORAMINOTOMY LUMBAR THREE- LUMBAR FOUR, LUMBAR FOUR- LUMBAR FIVE;  Surgeon: Newman Pies, MD;  Location:  [facility];  Service: Neurosurgery;  Laterality: N/A;  . plate in let leg    . SHOULDER ACROMIOPLASTY Left 06/10/2014   Procedure: SHOULDER ACROMIOPLASTY;  Surgeon: Carole Civil, MD;  Location: [facility];  Service: Orthopedics;  Laterality: Left;  . SHOULDER OPEN ROTATOR CUFF REPAIR Left 04/08/2014   Dr. Aline Brochure  . SHOULDER OPEN ROTATOR CUFF REPAIR Left 06/10/2014   Procedure: REVISION ROTATOR CUFF REPAIR SHOULDER OPEN WITH GRAFT;  Surgeon: Carole Civil, MD;  Location: [facility];  Service: Orthopedics;  Laterality: Left;  . SHOULDER OPEN ROTATOR CUFF REPAIR Right 10/29/2018   Procedure: OPEN ROTATOR CUFF REPAIR WITH ACHROMIOPLASTY;  Surgeon: Carole Civil, MD;  Location: [facility];  Service: Orthopedics;  Laterality: Right;  . TUBAL LIGATION      There were no vitals filed for this visit.  Subjective Assessment - 01/08/19 1030    Subjective  COVID-19  screen performed prior to patient entering clinic.  My pain is about a 5 today.    Pertinent History  Degenerative arthritis, ankle, left shoulder and lumbar surgery, plate in left LE.    Patient Stated Goals  Use right arm again.    Currently in Pain?  Yes    Pain Score  5  Pain Orientation  Right    Pain Descriptors / Indicators  Discomfort                       [facility] Adult PT Treatment/Exercise - 01/08/19 0001      Exercises   Exercises  Shoulder      Shoulder Exercises: Standing   Other Standing Exercises  Minimal ROM RW4 with yellow theraband f/b full can and ER with patient's right elbow supported at 45 degrees of abduction performed to fatigue.      Shoulder Exercises: Pulleys   Flexion  5 minutes    Other Pulley Exercises  UE Ranger on wall x 5 minutes f/b wall ladder x 5 minutes    Other Pulley Exercises  Ball on wall x 2 minutes.      Modalities   Modalities  Health visitor Stimulation Location  Right shoulder.    Electrical Stimulation  Action  IFC    Electrical Stimulation Parameters  80-150 Hz x 20 minutes.    Electrical Stimulation Goals  Pain      Vasopneumatic   Number Minutes Vasopneumatic   20 minutes    Vasopnuematic Location   --   Right shoulder.   Vasopneumatic Pressure  Low               PT Short Term Goals - 11/15/18 1214      PT SHORT TERM GOAL #1   Title  STG's=LTG's.        PT Long Term Goals - 12/16/18 1009      PT LONG TERM GOAL #1   Title  Independent with a HEP    Time  8    Period  Weeks    Status  On-going      PT LONG TERM GOAL #2   Title  Active shoulder right flexion to 145 degrees so the patient can easily reach overhead.    Time  8    Status  On-going      PT LONG TERM GOAL #3   Title  Active ER to 70 degrees+ to allow for easily donning/doffing of apparel.    Time  8    Period  Weeks    Status  On-going      PT LONG TERM GOAL #4   Title  Increase ROM so patient is able to reach behind back.    Time  8    Period  Weeks    Status  On-going      PT LONG TERM GOAL #5   Title  Increase right shoulder strength to a solid 4+/5 to increase stability for performance of functional activities.    Time  8    Period  Weeks    Status  On-going      PT LONG TERM GOAL #6   Title  Perform ADL's with right shoulder pain not > 2-3/10.    Time  8    Status  On-going            Plan - 01/08/19 1055    Clinical Impression Statement  Patient did well with tretament today.  She was reporting less pain today and stating how the exercise and electrical stimulation decreases her pain.  Her movement patterns are improving though she is still quite weak as expected.  Patient also has continued loss of ROM.  Patient did well with minimal range RW4 exercise with  yellow theraband.    PT Treatment/Interventions  ADLs/Self Care Home Management;Cryotherapy;Electrical Stimulation;Therapeutic activities;Therapeutic exercise;Patient/family education;Manual  techniques;Ultrasound;Vasopneumatic Device    PT Next Visit Plan  cont with POC for PROM and  AAROM.  Vasopneumatic and electrical stimulation.    Consulted and Agree with Plan of Care  Patient       Patient will benefit from skilled therapeutic intervention in order to improve the following deficits and impairments:  Pain, Decreased activity tolerance, Decreased range of motion  Visit Diagnosis: Acute pain of right shoulder  Stiffness of right shoulder, not elsewhere classified     Problem List Patient Active Problem List   Diagnosis Date Noted  . Headache syndrome 11/07/2018  . S/P right rotator cuff repair right 10/29/2018   . Spinal stenosis of lumbar region with neurogenic claudication 10/29/2017  . Tremor 09/11/2014  . Unspecified constipation 04/30/2014  . Rotator cuff (capsule) sprain 04/13/2014  . Rotator cuff tear, left 02/26/2014  . Memory deficit 02/25/2014  . Dizziness and giddiness 03/07/2013  . Headache 03/07/2013    [place], [place] MPT 01/08/2019, 11:01 AM  [facility] [address] [place], [place], [address] Phone: [phone]   Fax:  [phone]  Name: Beth Meza MRN: [id number] Date of Birth: Nov 05, 1945  PHYSICAL THERAPY DISCHARGE SUMMARY  Visits from Start of Care: 13.  Current functional level related to goals / functional outcomes: See above.   Remaining deficits: See below.   Education / Equipment: HEP.  Plan: Patient agrees to discharge.  Patient goals were not met. Patient is being discharged due to not returning since the last visit.  ?????         Mali Applegate MPT

## 2019-01-15 ENCOUNTER — Ambulatory Visit: Payer: Medicare Other | Admitting: Physical Therapy

## 2019-01-17 ENCOUNTER — Encounter: Payer: Medicaid Other | Admitting: Physical Therapy

## 2019-01-22 ENCOUNTER — Encounter: Payer: Self-pay | Admitting: Orthopedic Surgery

## 2019-01-22 ENCOUNTER — Ambulatory Visit (INDEPENDENT_AMBULATORY_CARE_PROVIDER_SITE_OTHER): Payer: Medicare Other | Admitting: Orthopedic Surgery

## 2019-01-22 ENCOUNTER — Other Ambulatory Visit: Payer: Self-pay

## 2019-01-22 VITALS — Temp 97.5°F | Ht 67.0 in | Wt 173.0 lb

## 2019-01-22 DIAGNOSIS — Z9889 Other specified postprocedural states: Secondary | ICD-10-CM

## 2019-01-22 NOTE — Progress Notes (Signed)
Chief Complaint  Patient presents with  . Routine Post Op    10/29/18 right shoulder RCR improving   Encounter Diagnosis  Name Primary?  . S/P right rotator cuff repair right 10/29/2018 Yes    12 weeks post op 10/29/2018  ROM: PROM 150 FLEXION 40 ER  ABD 90   PAIN MILD TAKING MRELAXERS BID   FU 3 MONTHS   9:03 AM  PATIENT:  Beth Meza  73 y.o. female  PRE-OPERATIVE DIAGNOSIS:  right shoulder rotator cuff tear  POST-OPERATIVE DIAGNOSIS:  right shoulder rotator cuff tear  PROCEDURE:  Procedure(s): OPEN ROTATOR CUFF REPAIR WITH ACHROMIOPLASTY (Right)-23410  SURGEON:  Surgeon(s) and Role:    Vickki Hearing, MD - Primary ost op

## 2019-01-22 NOTE — Patient Instructions (Addendum)
Home exercises for 3 months  

## 2019-01-24 ENCOUNTER — Telehealth: Payer: Self-pay | Admitting: Physical Therapy

## 2019-01-24 NOTE — Telephone Encounter (Signed)
Beth Meza was contacted today regarding the temporary reduction of OP Rehab Services due to concerns for community transmission of Covid-19.    Beth Meza is currently staying with her daughter in [place] for a few weeks, she is compliant with her HEP, assured they had no unanswered questions at this time. Outpatient Rehabilitation Services will follow up with this client. Patient is aware we can be reached by telephone during limited business hours in the meantime.

## 2019-03-04 ENCOUNTER — Telehealth: Payer: Self-pay | Admitting: Physical Therapy

## 2019-03-04 NOTE — Telephone Encounter (Signed)
Beth Meza was contacted in regards to resuming therapy. Left voicemail for patient to give our office a call back.

## 2019-04-09 ENCOUNTER — Other Ambulatory Visit (HOSPITAL_COMMUNITY): Payer: Self-pay | Admitting: Internal Medicine

## 2019-04-09 ENCOUNTER — Other Ambulatory Visit (HOSPITAL_COMMUNITY): Payer: Self-pay | Admitting: Family

## 2019-04-09 DIAGNOSIS — Z1231 Encounter for screening mammogram for malignant neoplasm of breast: Secondary | ICD-10-CM

## 2019-04-23 ENCOUNTER — Encounter: Payer: Self-pay | Admitting: Orthopedic Surgery

## 2019-04-23 ENCOUNTER — Ambulatory Visit (INDEPENDENT_AMBULATORY_CARE_PROVIDER_SITE_OTHER): Payer: Medicare Other | Admitting: Orthopedic Surgery

## 2019-04-23 ENCOUNTER — Other Ambulatory Visit: Payer: Self-pay

## 2019-04-23 VITALS — BP 129/82 | HR 109 | Temp 97.0°F | Ht 67.0 in | Wt 177.0 lb

## 2019-04-23 DIAGNOSIS — M654 Radial styloid tenosynovitis [de Quervain]: Secondary | ICD-10-CM

## 2019-04-23 DIAGNOSIS — Z9889 Other specified postprocedural states: Secondary | ICD-10-CM

## 2019-04-23 NOTE — Patient Instructions (Signed)
Work on strength at home for next 3 months

## 2019-04-23 NOTE — Progress Notes (Signed)
Progress Note   Patient ID: Beth Meza, female   DOB: 1945-11-05, 73 y.o.   MRN: [id number]   Chief Complaint  Patient presents with  . Shoulder Pain    RCR DOS 10/29/18    Encounter Diagnoses  Name Primary?  . S/P right rotator cuff repair right 10/29/2018 Yes  . De Quervain's disease (tenosynovitis)     No Known Allergies   Current Outpatient Medications:  .  cyclobenzaprine (FLEXERIL) 10 MG tablet, Take 1 tablet (10 mg total) by mouth 3 (three) times daily as needed for muscle spasms., Disp: 50 tablet, Rfl: 1 .  famotidine (PEPCID) 20 MG tablet, Take 20 mg by mouth 2 (two) times daily., Disp: , Rfl:  .  Ferrous Gluconate (IRON 27 PO), Take 27 mg by mouth 2 (two) times daily., Disp: , Rfl:  .  lisinopril-hydrochlorothiazide (PRINZIDE,ZESTORETIC) 20-12.5 MG tablet, Take 1 tablet by mouth daily., Disp: , Rfl:  .  Multiple Vitamins-Minerals (SENIOR MULTIVITAMIN PLUS PO), Take 1 tablet by mouth daily., Disp: , Rfl:  .  Omega-3 Fatty Acids (FISH OIL) 1000 MG CAPS, Take 1,000 mg by mouth daily. , Disp: , Rfl:  .  pravastatin (PRAVACHOL) 40 MG tablet, Take 40 mg by mouth daily., Disp: , Rfl:  .  venlafaxine XR (EFFEXOR-XR) 37.5 MG 24 hr capsule, Take 2 capsules (75 mg total) by mouth daily with breakfast., Disp: 60 capsule, Rfl: 5 .  vitamin C (ASCORBIC ACID) 500 MG tablet, Take 500 mg by mouth daily., Disp: , Rfl:  .  ibuprofen (ADVIL,MOTRIN) 800 MG tablet, , Disp: , Rfl:  .  metFORMIN (GLUCOPHAGE) 500 MG tablet, Take 500 mg by mouth 2 (two) times daily., Disp: , Rfl: 5   HPI  Status post cuff repair 6 months ago complains of some stiffness in the right shoulder she stopped therapy and is working at home Review of Systems  Musculoskeletal: Positive for joint pain.  Neurological: Positive for tingling.    Left thumb pain several weeks base of the thumb pain with ulnar deviation  BP 129/82   Pulse (!) 109   Temp (!) 97 F (36.1 C)   Ht 5\' 7"  (1.702 m)   Wt 177 lb (80.3 kg)    BMI 27.72 kg/m   Physical Exam Vitals signs and nursing note reviewed.  Constitutional:      Appearance: Normal appearance.  Musculoskeletal:       Arms:       Hands:  Neurological:     Mental Status: She is alert and oriented to person, place, and time.  Psychiatric:        Mood and Affect: Mood normal.      Medical decisions:  (Established problem worse, x-ray ,physical therapy, over-the-counter medicines, read outside film or summarize x-ray)  Data  Imaging:   No imaging  Encounter Diagnoses  Name Primary?  . S/P right rotator cuff repair right 10/29/2018 Yes  . De Quervain's disease (tenosynovitis)     PLAN:   Continue to work on strengthening right rotator cuff follow-up as needed Patient will use topical medication over the [facility] joint and first extensor compartment for de Quervain's syndrome   Arther Abbott, MD 04/23/2019 10:56 AM

## 2019-05-10 ENCOUNTER — Other Ambulatory Visit: Payer: Self-pay | Admitting: Neurology

## 2019-06-03 ENCOUNTER — Ambulatory Visit: Payer: Medicare Other | Admitting: Neurology

## 2019-06-10 ENCOUNTER — Ambulatory Visit: Payer: Medicare Other | Admitting: Neurology

## 2019-07-17 ENCOUNTER — Emergency Department (HOSPITAL_COMMUNITY)
Admission: EM | Admit: 2019-07-17 | Discharge: 2019-07-17 | Disposition: A | Discharge status: Home / Self Care | Payer: Medicare Other | Attending: Emergency Medicine | Admitting: Emergency Medicine

## 2019-07-17 ENCOUNTER — Other Ambulatory Visit: Payer: Self-pay

## 2019-07-17 ENCOUNTER — Emergency Department (HOSPITAL_COMMUNITY): Payer: Medicare Other

## 2019-07-17 ENCOUNTER — Encounter (HOSPITAL_COMMUNITY): Payer: Self-pay

## 2019-07-17 DIAGNOSIS — R079 Chest pain, unspecified: Secondary | ICD-10-CM | POA: Diagnosis present

## 2019-07-17 DIAGNOSIS — R0789 Other chest pain: Secondary | ICD-10-CM | POA: Diagnosis not present

## 2019-07-17 DIAGNOSIS — E119 Type 2 diabetes mellitus without complications: Secondary | ICD-10-CM | POA: Diagnosis not present

## 2019-07-17 DIAGNOSIS — I10 Essential (primary) hypertension: Secondary | ICD-10-CM | POA: Diagnosis not present

## 2019-07-17 LAB — CBC
HCT: 38.7 % (ref 36.0–46.0)
Hemoglobin: 12.2 g/dL (ref 12.0–15.0)
MCH: 25.3 pg — ABNORMAL LOW (ref 26.0–34.0)
MCHC: 31.5 g/dL (ref 30.0–36.0)
MCV: 80.1 fL (ref 80.0–100.0)
Platelets: 282 10*3/uL (ref 150–400)
RBC: 4.83 MIL/uL (ref 3.87–5.11)
RDW: 17.7 % — ABNORMAL HIGH (ref 11.5–15.5)
WBC: 7.5 10*3/uL (ref 4.0–10.5)
nRBC: 0 % (ref 0.0–0.2)

## 2019-07-17 LAB — BASIC METABOLIC PANEL
Anion gap: 16 — ABNORMAL HIGH (ref 5–15)
BUN: UNDETERMINED mg/dL (ref 8–23)
CO2: 17 mmol/L — ABNORMAL LOW (ref 22–32)
Calcium: 9.6 mg/dL (ref 8.9–10.3)
Chloride: 106 mmol/L (ref 98–111)
Creatinine, Ser: 0.55 mg/dL (ref 0.44–1.00)
GFR calc Af Amer: >60 mL/min (ref >60)
GFR calc non Af Amer: >60 mL/min (ref >60)
Glucose, Bld: 104 mg/dL — ABNORMAL HIGH (ref 70–99)
Potassium: 4.4 mmol/L (ref 3.5–5.1)
Sodium: 139 mmol/L (ref 135–145)

## 2019-07-17 LAB — TROPONIN I (HIGH SENSITIVITY)
Troponin I (High Sensitivity): 2 ng/L (ref <18)
Troponin I (High Sensitivity): 3 ng/L (ref <18)

## 2019-07-17 MED ORDER — DICLOFENAC SODIUM 1 % TD GEL: 2.0000 g | Freq: Four times a day (QID) | TRANSDERMAL | 0 refills | Status: DC | PRN

## 2019-07-17 MED ORDER — SODIUM CHLORIDE 0.9% FLUSH: 3.0000 mL | Freq: Once | INTRAVENOUS | Status: DC

## 2019-07-17 NOTE — ED Triage Notes (Signed)
Pt reports right sided chest pain that goes to back and down right. This morning pain is worse. This is 3rd day . Pt reports pain with deep breath

## 2019-07-17 NOTE — ED Notes (Signed)
Pt waiting for her daughter

## 2019-07-17 NOTE — ED Notes (Signed)
Pt still complaining of pain with movement. Have educated on Muscular pain.

## 2019-07-17 NOTE — ED Notes (Signed)
Have given pt something to eat.

## 2019-07-17 NOTE — ED Provider Notes (Signed)
Emergency Department Provider Note   I have reviewed the triage vital signs and the nursing notes.   HISTORY  Chief Complaint Chest Pain   HPI Beth Meza is a 73 y.o. female with PMH reviewed below presents to the emergency department for evaluation of right sided chest pain over the past 3 days.  She describes intermittent pain which is sharp, stabbing, and radiating to her back.  She denies shortness of breath.  She states the pain was significantly worse with any movement, touching the area, deep breathing.  She denies any injuries.  No similar pain in the past.  She called her primary care doctor today who referred her to the emergency department.  She denies any diaphoresis, nausea, vomiting.   Past Medical History:  Diagnosis Date  . Anemia   . Back spasm   . Degenerative arthritis   . Diabetes (HCC)    diet controlled  . Dizziness and giddiness 03/07/2013  . Dyslipidemia   . Gastroesophageal reflux disease   . Hand cramps   . Headache syndrome 11/07/2018  . Headache(784.0) 03/07/2013  . History of carpal tunnel syndrome    Bilateral  . HOH (hard of hearing)   . Hypertension   . Leg cramps   . Memory deficit 02/25/2014  . Seizures (HCC)    as child- unknown etiology-no meds    Patient Active Problem List   Diagnosis Date Noted  . Headache syndrome 11/07/2018  . S/P right rotator cuff repair right 10/29/2018   . Spinal stenosis of lumbar region with neurogenic claudication 10/29/2017  . Tremor 09/11/2014  . Unspecified constipation 04/30/2014  . Rotator cuff (capsule) sprain 04/13/2014  . Rotator cuff tear, left 02/26/2014  . Memory deficit 02/25/2014  . Dizziness and giddiness 03/07/2013  . Headache 03/07/2013    Past Surgical History:  Procedure Laterality Date  . ANKLE SURGERY Left    History of ankle fracture  . BACK SURGERY    . COLONOSCOPY N/A 05/04/2014   Procedure: COLONOSCOPY;  Surgeon: West Bali, MD;  Location: [facility];  Service:  Endoscopy;  Laterality: N/A;  11:30  . HAND SURGERY     Carpal tunnel syndrome, trigger fingers bilaterally  . KNEE SURGERY Left    Pin, history of fracture  . LUMBAR LAMINECTOMY/DECOMPRESSION MICRODISCECTOMY N/A 10/29/2017   Procedure: LUMBAR LAMINECTOMY AND FORAMINOTOMY LUMBAR THREE- LUMBAR FOUR, LUMBAR FOUR- LUMBAR FIVE;  Surgeon: Tressie Stalker, MD;  Location: [facility] OR;  Service: Neurosurgery;  Laterality: N/A;  . plate in let leg    . SHOULDER ACROMIOPLASTY Left 06/10/2014   Procedure: SHOULDER ACROMIOPLASTY;  Surgeon: Vickki Hearing, MD;  Location: [facility];  Service: Orthopedics;  Laterality: Left;  . SHOULDER OPEN ROTATOR CUFF REPAIR Left 04/08/2014   Dr. Romeo Apple  . SHOULDER OPEN ROTATOR CUFF REPAIR Left 06/10/2014   Procedure: REVISION ROTATOR CUFF REPAIR SHOULDER OPEN WITH GRAFT;  Surgeon: Vickki Hearing, MD;  Location: [facility];  Service: Orthopedics;  Laterality: Left;  . SHOULDER OPEN ROTATOR CUFF REPAIR Right 10/29/2018   Procedure: OPEN ROTATOR CUFF REPAIR WITH ACHROMIOPLASTY;  Surgeon: Vickki Hearing, MD;  Location: [facility];  Service: Orthopedics;  Laterality: Right;  . TUBAL LIGATION      Allergies Patient has no known allergies.  Family History  Problem Relation Age of Onset  . Heart attack Father   . Colon cancer Neg Hx     Social History Social History   Tobacco Use  . Smoking status: Never  Smoker  . Smokeless tobacco: Never Used  Substance Use Topics  . Alcohol use: No  . Drug use: No    Review of Systems  Constitutional: No fever/chills Eyes: No visual changes. ENT: No sore throat. Cardiovascular: Positive chest pain. Respiratory: Denies shortness of breath. Gastrointestinal: No abdominal pain.  No nausea, no vomiting.  No diarrhea.  No constipation. Genitourinary: Negative for dysuria. Musculoskeletal: Negative for back pain. Skin: Negative for rash. Neurological: Negative for headaches, focal weakness or numbness.  10-point ROS otherwise  negative.  ____________________________________________   PHYSICAL EXAM:  VITAL SIGNS: ED Triage Vitals  Enc Vitals Group     BP 07/17/19 1157 (!) 151/77     Pulse Rate 07/17/19 1157 (!) 108     Resp 07/17/19 1155 20     Temp 07/17/19 1155 98.5 F (36.9 C)     Temp Source 07/17/19 1155 Oral     SpO2 07/17/19 1155 100 %     Weight 07/17/19 1156 180 lb (81.6 kg)     Height 07/17/19 1156 5\' 7"  (1.702 m)   Constitutional: Alert and oriented. Well appearing and in no acute distress. Eyes: Conjunctivae are normal.  Head: Atraumatic. Nose: No congestion/rhinnorhea. Mouth/Throat: Mucous membranes are moist.  Oropharynx non-erythematous. Neck: No stridor.  Cardiovascular: Tachycardia. Good peripheral circulation. Grossly normal heart sounds.   Respiratory: Normal respiratory effort.  No retractions. Lungs CTAB. Gastrointestinal: Soft and nontender. No distention.  Musculoskeletal: No lower extremity tenderness nor edema. No gross deformities of extremities.  Exquisite tenderness to palpation over the right, anterior chest.  No crepitus, deformity, paradoxical movement.  Patient withdrawals from my touch and reports that this is the precise pain she was experiencing.  Neurologic:  Normal speech and language.  Skin:  Skin is warm, dry and intact. No rash noted.  ____________________________________________   LABS (all labs ordered are listed, but only abnormal results are displayed)  Labs Reviewed  BASIC METABOLIC PANEL - Abnormal; Notable for the following components:      Result Value   CO2 17 (*)    Glucose, Bld 104 (*)    Anion gap 16 (*)    All other components within normal limits  CBC - Abnormal; Notable for the following components:   MCH 25.3 (*)    RDW 17.7 (*)    All other components within normal limits  TROPONIN I (HIGH SENSITIVITY)  TROPONIN I (HIGH SENSITIVITY)   ____________________________________________  EKG   EKG Interpretation  Date/Time:  Thursday  July 17 2019 12:05:42 EDT Ventricular Rate:  98 PR Interval:    QRS Duration: 94 QT Interval:  359 QTC Calculation: 459 R Axis:   42 Text Interpretation:  Sinus rhythm Consider right atrial enlargement Borderline T abnormalities, lateral leads No STEMI  Confirmed by Nanda Quinton [phone]) on 07/17/2019 12:16:20 PM       ____________________________________________  RADIOLOGY  Dg Chest 2 View  Result Date: 07/17/2019 CLINICAL DATA:  Right chest pain for 2 days. EXAM: CHEST - 2 VIEW COMPARISON:  PA and lateral chest 09/14/2017. FINDINGS: Lungs clear. Heart size normal. No pneumothorax or pleural fluid. No acute or focal bony abnormality. IMPRESSION: Negative chest. Electronically Signed   By: Inge Rise M.D.   On: 07/17/2019 13:04    ____________________________________________   PROCEDURES  Procedure(s) performed:   Procedures  None  ____________________________________________   INITIAL IMPRESSION / ASSESSMENT AND PLAN / ED COURSE  Pertinent labs & imaging results that were available during my care of the patient were  reviewed by me and considered in my medical decision making (see chart for details).   Patient presents to the emergency department for evaluation of right-sided chest pain.  This is very reproducible on exam and my suspicion for musculoskeletal etiology of her pain is high.  Given her age and nonspecific T wave changes on EKG I do plan for screening lab work including troponin as well as a chest x-ray.  Suspicion for PE, given her exam, is very low despite her tachycardia here.  She denies hypoxia or shortness of breath.  Plain films and labs reviewed. No acute findings. Plan for discharge with Voltaren Rx and close PCP follow up. Discussed ED return with new/worsening symptoms. Vitals improved here without intervention making PE even less likely clinically.  ____________________________________________  FINAL CLINICAL IMPRESSION(S) / ED DIAGNOSES   Final diagnoses:  Atypical chest pain     MEDICATIONS GIVEN DURING THIS VISIT:  Medications  sodium chloride flush (NS) 0.9 % injection 3 mL (3 mLs Intravenous Not Given 07/17/19 1222)     NEW OUTPATIENT MEDICATIONS STARTED DURING THIS VISIT:  New Prescriptions   DICLOFENAC SODIUM (VOLTAREN) 1 % GEL    Apply 2 g topically 4 (four) times daily as needed (pain).    Note:  This document was prepared using Dragon voice recognition software and may include unintentional dictation errors.  Alona BeneJoshua , MD, [facility]    , Arlyss RepressJoshua G, MD 07/17/19 [phone]

## 2019-07-17 NOTE — ED Notes (Signed)
Pt rolled out in a wheelchair to family. NAD.

## 2019-07-17 NOTE — Discharge Instructions (Signed)
He was seen in the emergency department today with chest pain.  Your heart labs and x-rays are normal.  I have very low suspicion that this is caused by heart attack or other serious issue.  I suspect that you have inflammation and/or strain in your chest wall muscles.  I have prescribed a gel to apply as needed for pain.  Please follow-up with your primary care doctor by phone today to schedule the next available appointment.  Return to the emergency department any new or suddenly worsening symptoms.

## 2020-01-22 ENCOUNTER — Other Ambulatory Visit: Payer: Self-pay | Admitting: Neurology

## 2020-01-30 ENCOUNTER — Other Ambulatory Visit: Payer: Self-pay | Admitting: Neurology

## 2020-02-22 ENCOUNTER — Other Ambulatory Visit: Payer: Self-pay | Admitting: Neurology

## 2020-02-27 ENCOUNTER — Other Ambulatory Visit: Payer: Self-pay | Admitting: Neurology

## 2020-03-23 ENCOUNTER — Other Ambulatory Visit (HOSPITAL_COMMUNITY): Payer: Self-pay | Admitting: Internal Medicine

## 2020-03-23 DIAGNOSIS — Z1231 Encounter for screening mammogram for malignant neoplasm of breast: Secondary | ICD-10-CM

## 2020-03-29 ENCOUNTER — Other Ambulatory Visit: Payer: Self-pay

## 2020-03-29 ENCOUNTER — Encounter: Payer: Self-pay | Admitting: Neurology

## 2020-03-29 ENCOUNTER — Ambulatory Visit (INDEPENDENT_AMBULATORY_CARE_PROVIDER_SITE_OTHER): Payer: Medicare Other | Admitting: Neurology

## 2020-03-29 VITALS — BP 168/94 | HR 80 | Ht 66.0 in | Wt 176.0 lb

## 2020-03-29 DIAGNOSIS — G4489 Other headache syndrome: Secondary | ICD-10-CM | POA: Diagnosis not present

## 2020-03-29 DIAGNOSIS — R42 Dizziness and giddiness: Secondary | ICD-10-CM | POA: Diagnosis not present

## 2020-03-29 NOTE — Patient Instructions (Signed)
Continue Effexor as prescribed  See you back in 6 months

## 2020-03-29 NOTE — Progress Notes (Signed)
PATIENT: Beth Meza DOB: [birth year]  REASON FOR VISIT: follow up HISTORY FROM: patient  HISTORY OF PRESENT ILLNESS: Today 03/29/20  Beth Meza is a 74 year old female with history of chronic headache, dizziness, and mild memory disorder.  She has had MRI of the brain previously that was normal.  She was last seen at this office in January 2020.  She had improvement of her symptoms with Zonegran and Effexor previously, but ran out of Zonegran. Is currently taking Effexor-XR 75 mg daily.  Continues to have chronic daily dull headache, frontal, may occasionally take Tylenol, does not impair her daily activity.  Effexor has been more than 50% beneficial.  Symptoms remain well controlled, has not had significant episode of vertigo, has had a few mild events, may have more significant headache with episodes.  She lives alone, but has an aide who comes in, her children check on her.  No recent falls, she uses a cane.  She has not driven since rotator cuff repair last year.  She feels her condition is overall stable.  She presents today for evaluation unaccompanied.  HISTORY 11/07/2018 Dr. Anne Hahn: Beth Meza is a 74 year old right-handed black female with a history of chronic headache and dizziness and a mild memory disorder.  The patient has had MRI evaluation of the brain in the past that was normal.  The patient continues to have some symptoms, she has been feeling better on a combination of Zonegran and Effexor, but she has run out of the medications within the last year, she was last seen through this office in 2018.  The patient has recently had surgery for a right rotator cuff tear, she will be entering into physical therapy in the near future.  She returns for further evaluation.  She reports lightheaded floaty sensation in the head with sitting, standing, and lying down.  The headache is usually fairly mild but is always present.  She returns for further management   REVIEW OF SYSTEMS: Out of  a complete 14 system review of symptoms, the patient complains only of the following symptoms, and all other reviewed systems are negative.  Headache  ALLERGIES: No Known Allergies  HOME MEDICATIONS: Outpatient Medications Prior to Visit  Medication Sig Dispense Refill  . amLODipine (NORVASC) 5 MG tablet Take 1 tablet by mouth daily.    . cyclobenzaprine (FLEXERIL) 10 MG tablet Take 10 mg by mouth 3 (three) times daily as needed for muscle spasms.    . diclofenac sodium (VOLTAREN) 1 % GEL Apply 2 g topically 4 (four) times daily as needed (pain). 100 g 0  . donepezil (ARICEPT) 5 MG tablet Take 1 tablet by mouth daily.    . famotidine (PEPCID) 20 MG tablet Take 20 mg by mouth 2 (two) times daily.    . Ferrous Gluconate (IRON 27 PO) Take 27 mg by mouth 2 (two) times daily.    Marland Kitchen lisinopril-hydrochlorothiazide (PRINZIDE,ZESTORETIC) 20-12.5 MG tablet Take 1 tablet by mouth daily.    . Multiple Vitamins-Minerals (SENIOR MULTIVITAMIN PLUS PO) Take 1 tablet by mouth daily.    . Omega-3 Fatty Acids (FISH OIL) 1000 MG CAPS Take 1,000 mg by mouth daily.     . pravastatin (PRAVACHOL) 40 MG tablet Take 40 mg by mouth daily.    Marland Kitchen venlafaxine XR (EFFEXOR-XR) 37.5 MG 24 hr capsule TAKE 2 CAPSULES (75 MG TOTAL) BY MOUTH DAILY WITH BREAKFAST. 180 capsule 1  . vitamin C (ASCORBIC ACID) 500 MG tablet Take 500 mg by mouth daily.  No facility-administered medications prior to visit.    PAST MEDICAL HISTORY: Past Medical History:  Diagnosis Date  . Anemia   . Back spasm   . Degenerative arthritis   . Diabetes (HCC)    diet controlled  . Dizziness and giddiness 03/07/2013  . Dyslipidemia   . Gastroesophageal reflux disease   . Hand cramps   . Headache syndrome 11/07/2018  . Headache(784.0) 03/07/2013  . History of carpal tunnel syndrome    Bilateral  . HOH (hard of hearing)   . Hypertension   . Leg cramps   . Memory deficit 02/25/2014  . Seizures (HCC)    as child- unknown etiology-no meds     PAST SURGICAL HISTORY: Past Surgical History:  Procedure Laterality Date  . ANKLE SURGERY Left    History of ankle fracture  . BACK SURGERY    . COLONOSCOPY N/A 05/04/2014   Procedure: COLONOSCOPY;  Surgeon: West Bali, MD;  [facility];  Service: Endoscopy;  Laterality: N/A;  11:30  . HAND SURGERY     Carpal tunnel syndrome, trigger fingers bilaterally  . KNEE SURGERY Left    Pin, history of fracture  . LUMBAR LAMINECTOMY/DECOMPRESSION MICRODISCECTOMY N/A 10/29/2017   Procedure: LUMBAR LAMINECTOMY AND FORAMINOTOMY LUMBAR THREE- LUMBAR FOUR, LUMBAR FOUR- LUMBAR FIVE;  Surgeon: Tressie Stalker, MD;  Location[facility] OR;  Service: Neurosurgery;  Laterality: N/A;  . plate in let leg    . SHOULDER ACROMIOPLASTY Left 06/10/2014   Procedure: SHOULDER ACROMIOPLASTY;  Surgeon: Vickki Hearing, MD;  Location: [facility];  Service: Orthopedics;  Laterality: Left;  . SHOULDER OPEN ROTATOR CUFF REPAIR Left 04/08/2014   Dr. Romeo Apple  . SHOULDER OPEN ROTATOR CUFF REPAIR Left 06/10/2014   Procedure: REVISION ROTATOR CUFF REPAIR SHOULDER OPEN WITH GRAFT;  Surgeon: Vickki Hearing, MD;  Location: [facility];  Service: Orthopedics;  Laterality: Left;  . SHOULDER OPEN ROTATOR CUFF REPAIR Right 10/29/2018   Procedure: OPEN ROTATOR CUFF REPAIR WITH ACHROMIOPLASTY;  Surgeon: Vickki Hearing, MD;  Location: [facility];  Service: Orthopedics;  Laterality: Right;  . TUBAL LIGATION      FAMILY HISTORY: Family History  Problem Relation Age of Onset  . Heart attack Father   . Colon cancer Neg Hx     SOCIAL HISTORY: Social History   Socioeconomic History  . Marital status: Divorced    Spouse name: Not on file  . Number of children: 3  . Years of education: 69  . Highest education level: Not on file  Occupational History  . Occupation: Retired    Comment: Agricultural consultant  Tobacco Use  . Smoking status: Never Smoker  . Smokeless tobacco: Never Used  Vaping Use  . Vaping Use: Never used   Substance and Sexual Activity  . Alcohol use: No  . Drug use: No  . Sexual activity: Yes    Birth control/protection: Post-menopausal  Other Topics Concern  . Not on file  Social History Narrative   Patient is right handed.   Patient does not drink caffeine.   Social Determinants of Health   Financial Resource Strain:   . Difficulty of Paying Living Expenses:   Food Insecurity:   . Worried About Programme researcher, broadcasting/film/video in the Last Year:   . Barista in the Last Year:   Transportation Needs:   . Freight forwarder (Medical):   Marland Kitchen Lack of Transportation (Non-Medical):   Physical Activity:   . Days of Exercise per Week:   .  Minutes of Exercise per Session:   Stress:   . Feeling of Stress :   Social Connections:   . Frequency of Communication with Friends and Family:   . Frequency of Social Gatherings with Friends and Family:   . Attends Religious Services:   . Active Member of Clubs or Organizations:   . Attends Archivist Meetings:   Marland Kitchen Marital Status:   Intimate Partner Violence:   . Fear of Current or Ex-Partner:   . Emotionally Abused:   Marland Kitchen Physically Abused:   . Sexually Abused:    PHYSICAL EXAM  Vitals:   03/29/20 0844  BP: (!) 168/94  Pulse: 80  Weight: 176 lb (79.8 kg)  Height: 5\' 6"  (1.676 m)   Body mass index is 28.41 kg/m.  Generalized: Well developed, in no acute distress   Neurological examination  Mentation: Alert oriented to time, place, history taking. Follows all commands speech and language fluent Cranial nerve II-XII: Pupils were equal round reactive to light. Extraocular movements were full, visual field were full on confrontational test. Facial sensation and strength were normal. Head turning and shoulder shrug were normal and symmetric. Motor: Good strength throughout, general weakness to lower extremities due to chronic reported knee issues Sensory: Sensory testing is intact to soft touch on all 4 extremities. No evidence of  extinction is noted.  Coordination: Cerebellar testing reveals good finger-nose-finger and heel-to-shin bilaterally.  Gait and station: Has to push off from seated position to stand, gait is wide-based, cautious, fairly steady, able to ambulate in the room without single-point cane Reflexes: Deep tendon reflexes are symmetric and normal bilaterally.   DIAGNOSTIC DATA (LABS, IMAGING, TESTING) - I reviewed patient records, labs, notes, testing and imaging myself where available.  Lab Results  Component Value Date   WBC 7.5 07/17/2019   HGB 12.2 07/17/2019   HCT 38.7 07/17/2019   MCV 80.1 07/17/2019   PLT 282 07/17/2019      Component Value Date/Time   NA 139 07/17/2019 1200   K 4.4 07/17/2019 1200   CL 106 07/17/2019 1200   CO2 17 (L) 07/17/2019 1200   GLUCOSE 104 (H) 07/17/2019 1200   BUN QUANTITY NOT SUFFICIENT, UNABLE TO PERFORM TEST 07/17/2019 1200   CREATININE 0.55 07/17/2019 1200   CREATININE 1.88 (H) 05/12/2014 1040   CALCIUM 9.6 07/17/2019 1200   PROT 9.0 (H) 11/04/2007 0009   ALBUMIN 4.2 11/04/2007 0009   AST 115 (H) 11/04/2007 0009   ALT 74 (H) 11/04/2007 0009   ALKPHOS 62 11/04/2007 0009   BILITOT 0.7 11/04/2007 0009   GFRNONAA >60 07/17/2019 1200   GFRAA >60 07/17/2019 1200   No results found for: CHOL, HDL, LDLCALC, LDLDIRECT, TRIG, CHOLHDL Lab Results  Component Value Date   HGBA1C 6.2 (H) 10/24/2018   No results found for: VITAMINB12 No results found for: TSH  ASSESSMENT AND PLAN 74 y.o. year old female  has a past medical history of Anemia, Back spasm, Degenerative arthritis, Diabetes ([place]), Dizziness and giddiness (03/07/2013), Dyslipidemia, Gastroesophageal reflux disease, Hand cramps, Headache syndrome (11/07/2018), Headache(784.0) (03/07/2013), History of carpal tunnel syndrome, HOH (hard of hearing), Hypertension, Leg cramps, Memory deficit (02/25/2014), and Seizures ([place]). here with:  1.  Chronic dizziness 2.  Chronic daily headache  She has had more  than 50% improvement with Effexor, she will remain on Effexor XR 75 mg daily.  If her symptoms worsen (headache, dizziness), I will add back in Zonegran 25 mg daily.  She will follow-up in 6 months  or sooner if needed.  I spent 20 minutes of face-to-face and non-face-to-face time with patient.  This included previsit chart review, lab review, study review, order entry, electronic health record documentation, patient education.  Margie Ege, AGNP-C, DNP 03/29/2020, 9:10 AM [facility] [address] [place], [place] [address] [phone]

## 2020-03-29 NOTE — Progress Notes (Signed)
I have read the note, and I agree with the clinical assessment and plan.   K    

## 2020-03-31 ENCOUNTER — Ambulatory Visit: Payer: Medicare Other

## 2020-03-31 ENCOUNTER — Other Ambulatory Visit: Payer: Self-pay

## 2020-03-31 ENCOUNTER — Encounter: Payer: Self-pay | Admitting: Orthopedic Surgery

## 2020-03-31 ENCOUNTER — Ambulatory Visit (INDEPENDENT_AMBULATORY_CARE_PROVIDER_SITE_OTHER): Payer: Medicare Other | Admitting: Orthopedic Surgery

## 2020-03-31 VITALS — BP 177/110 | HR 78 | Ht 66.5 in | Wt 179.0 lb

## 2020-03-31 DIAGNOSIS — M25561 Pain in right knee: Secondary | ICD-10-CM | POA: Diagnosis not present

## 2020-03-31 DIAGNOSIS — G2581 Restless legs syndrome: Secondary | ICD-10-CM | POA: Diagnosis not present

## 2020-03-31 DIAGNOSIS — G8929 Other chronic pain: Secondary | ICD-10-CM

## 2020-03-31 DIAGNOSIS — M5410 Radiculopathy, site unspecified: Secondary | ICD-10-CM

## 2020-03-31 DIAGNOSIS — M25562 Pain in left knee: Secondary | ICD-10-CM

## 2020-03-31 DIAGNOSIS — E1342 Other specified diabetes mellitus with diabetic polyneuropathy: Secondary | ICD-10-CM

## 2020-03-31 MED ORDER — MELOXICAM 7.5 MG PO TABS: 7.5000 mg | ORAL_TABLET | Freq: Every day | ORAL | 5 refills | Status: DC

## 2020-03-31 NOTE — Progress Notes (Addendum)
NEW PROBLEM//OFFICE VISIT  Chief Complaint  Patient presents with  . Leg Problem    has bilateral leg problem feels like something crawling at night   . Back Pain    2 back surgeries in past     74 year old female with spasms in her legs and a crawling feeling at night but also has bilateral knee pain.  Apparently primary care doctor sent her here to evaluate the spasms and crawling but that is obviously a nonorthopedic issue and should be explored for possible restless leg syndrome and for outside chance that her disc surgery may contribute to this but more likely to be restless leg  As far as the knees go she complains of bilateral knee pain dull aching pain and associated swelling     Review of Systems  All other systems reviewed and are negative.  Night sweating hearing loss ringing of the ears sore throat blurred vision chest pain pain in legs after walking leg swelling muscle aches neck pain back pain joint pain frequent falls dizziness headache vertigo memory loss  Past Medical History:  Diagnosis Date  . Anemia   . Back spasm   . Degenerative arthritis   . Diabetes (HCC)    diet controlled  . Dizziness and giddiness 03/07/2013  . Dyslipidemia   . Gastroesophageal reflux disease   . Hand cramps   . Headache syndrome 11/07/2018  . Headache(784.0) 03/07/2013  . History of carpal tunnel syndrome    Bilateral  . HOH (hard of hearing)   . Hypertension   . Leg cramps   . Memory deficit 02/25/2014  . Seizures (HCC)    as child- unknown etiology-no meds    Past Surgical History:  Procedure Laterality Date  . ANKLE SURGERY Left    History of ankle fracture  . BACK SURGERY    . COLONOSCOPY N/A 05/04/2014   Procedure: COLONOSCOPY;  Surgeon: West Bali, MD;  Location: [facility];  Service: Endoscopy;  Laterality: N/A;  11:30  . HAND SURGERY     Carpal tunnel syndrome, trigger fingers bilaterally  . KNEE SURGERY Left    Pin, history of fracture  . LUMBAR  LAMINECTOMY/DECOMPRESSION MICRODISCECTOMY N/A 10/29/2017   Procedure: LUMBAR LAMINECTOMY AND FORAMINOTOMY LUMBAR THREE- LUMBAR FOUR, LUMBAR FOUR- LUMBAR FIVE;  Surgeon: Tressie Stalker, MD;  Location: [facility] OR;  Service: Neurosurgery;  Laterality: N/A;  . plate in let leg    . SHOULDER ACROMIOPLASTY Left 06/10/2014   Procedure: SHOULDER ACROMIOPLASTY;  Surgeon: Vickki Hearing, MD;  Location: [facility];  Service: Orthopedics;  Laterality: Left;  . SHOULDER OPEN ROTATOR CUFF REPAIR Left 04/08/2014   Dr. Romeo Apple  . SHOULDER OPEN ROTATOR CUFF REPAIR Left 06/10/2014   Procedure: REVISION ROTATOR CUFF REPAIR SHOULDER OPEN WITH GRAFT;  Surgeon: Vickki Hearing, MD;  Location: [facility];  Service: Orthopedics;  Laterality: Left;  . SHOULDER OPEN ROTATOR CUFF REPAIR Right 10/29/2018   Procedure: OPEN ROTATOR CUFF REPAIR WITH ACHROMIOPLASTY;  Surgeon: Vickki Hearing, MD;  Location: [facility];  Service: Orthopedics;  Laterality: Right;  . TUBAL LIGATION      Family History  Problem Relation Age of Onset  . Heart attack Father   . Colon cancer Neg Hx    Social History   Tobacco Use  . Smoking status: Never Smoker  . Smokeless tobacco: Never Used  Vaping Use  . Vaping Use: Never used  Substance Use Topics  . Alcohol use: No  . Drug use: No  No Known Allergies  Current Meds  Medication Sig  . amLODipine (NORVASC) 5 MG tablet Take 1 tablet by mouth daily.  . cyclobenzaprine (FLEXERIL) 10 MG tablet Take 10 mg by mouth 3 (three) times daily as needed for muscle spasms.  . diclofenac sodium (VOLTAREN) 1 % GEL Apply 2 g topically 4 (four) times daily as needed (pain).  Marland Kitchen donepezil (ARICEPT) 5 MG tablet Take 1 tablet by mouth daily.  . famotidine (PEPCID) 20 MG tablet Take 20 mg by mouth 2 (two) times daily.  . Ferrous Gluconate (IRON 27 PO) Take 27 mg by mouth 2 (two) times daily.  Marland Kitchen lisinopril-hydrochlorothiazide (PRINZIDE,ZESTORETIC) 20-12.5 MG tablet Take 1 tablet by mouth daily.  . Multiple  Vitamins-Minerals (SENIOR MULTIVITAMIN PLUS PO) Take 1 tablet by mouth daily.  . Omega-3 Fatty Acids (FISH OIL) 1000 MG CAPS Take 1,000 mg by mouth daily.   . pravastatin (PRAVACHOL) 40 MG tablet Take 40 mg by mouth daily.  Marland Kitchen venlafaxine XR (EFFEXOR-XR) 37.5 MG 24 hr capsule TAKE 2 CAPSULES (75 MG TOTAL) BY MOUTH DAILY WITH BREAKFAST.  Marland Kitchen vitamin C (ASCORBIC ACID) 500 MG tablet Take 500 mg by mouth daily.    BP (!) 177/110   Pulse 78   Ht 5' 6.5" (1.689 m)   Wt 179 lb (81.2 kg)   BMI 28.46 kg/m   Physical Exam Patient has an odd affect mood is pleasant and oriented x3 appearance is normal gait is supported by a cane  Right knee skin is normal tenderness over the medial joint line 20 degrees knee stable strength normal  Effusion noted   Ortho Exam Left knee skin normal tenderness lateral compartment scar from plateau fracture surgery  Range of motion 125 degrees  Stable knee  Normal muscle tone  Neurovascular exam intact   MEDICAL DECISION MAKING  A.  Encounter Diagnoses  Name Primary?  . Chronic radicular pain of lower extremity Yes  . Chronic pain of left knee   . Chronic pain of right knee   . Restless legs syndrome (RLS)   . 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 DATA ANALYSED:  Right knee x-ray shows osteoarthritis moderate left knee x-ray shows a metal plate from the lateral tibial plateau with mild depression and mild osteoarthritis in the lumbar spine shows a degenerative scoliosis with facet arthritis  C. MANAGEMENT   Procedure note right knee injection   verbal consent was obtained to inject right knee joint  Timeout was completed to confirm the site of injection  The medications used were 40 mg of Depo-Medrol and 1% lidocaine 3 cc  Anesthesia was provided by ethyl chloride and the skin was prepped with alcohol.  After cleaning the skin with alcohol a 20-gauge needle was used to inject the right knee  joint. There were no complications. A sterile bandage was applied.   Procedure note left knee injection   verbal consent was obtained to inject left knee joint  Timeout was completed to confirm the site of injection  The medications used were 40 mg of Depo-Medrol and 1% lidocaine 3 cc  Anesthesia was provided by ethyl chloride and the skin was prepped with alcohol.  After cleaning the skin with alcohol a 20-gauge needle was used to inject the left knee joint. There were no complications. A sterile bandage was applied.    Meds ordered this encounter  Medications  . meloxicam (MOBIC) 7.5 MG tablet    Sig: Take 1 tablet (  7.5 mg total) by mouth daily.    Dispense:  30 tablet    Refill:  5   Patient may have restless leg syndrome could have some diabetic neuropathy or residual issues from the back surgery  Recommend to her primary care physician that she see someone about the back problem and leg problem with the radicular symptoms and that he evaluate and treat her for diabetic neuropathy and possible restless leg syndrome  I will see her in 6 months   Arther Abbott, MD  03/31/2020 3:00 PM

## 2020-03-31 NOTE — Patient Instructions (Addendum)
Encounter Diagnoses  Name Primary?  . Chronic radicular pain of lower extremity Yes  . Chronic pain of left knee   . Chronic pain of right knee   . Restless legs syndrome (RLS)   . 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 The sensations usually cause an overwhelming urge to move the legs. The arms can also sometimes be affected. The condition can range from mild to severe. The symptoms often interfere with a person's ability to sleep. What are the causes? The cause of this condition is not known. What increases the risk? The following factors may make you more likely to develop this condition:  Being older than 50.  Pregnancy.  Being a woman. In general, the condition is more common in women than in men.  A family history of the condition.  Having iron deficiency.  Overuse of caffeine, nicotine, or alcohol.  Certain medical conditions, such as kidney disease, Parkinson's disease, or nerve damage.  Certain medicines, such as those for high blood pressure, nausea, colds, allergies, depression, and some heart conditions. What are the signs or symptoms? The main symptom of this condition is uncomfortable sensations in the legs, such as:  Pulling.  Tingling.  Prickling.  Throbbing.  Crawling.  Burning. Usually, the sensations:  Affect both sides of the body.  Are worse when you sit or lie down.  Are worse at night. These may wake you up or make it difficult to fall asleep.  Make you have a strong urge to move your legs.  Are temporarily relieved by moving your legs. The arms can also be affected, but this is rare. People who have this condition often have tiredness during the day because of their lack of sleep at night. How is this diagnosed? This condition may  be diagnosed based on:  Your symptoms.  Blood tests. In some cases, you may be monitored in a sleep lab by a specialist (a sleep study). This can detect any disruptions in your sleep. How is this treated? This condition is treated by managing the symptoms. This may include:  Lifestyle changes, such as exercising, using relaxation techniques, and avoiding caffeine, alcohol, or tobacco.  Medicines. Anti-seizure medicines may be tried first. Follow these instructions at home:     General instructions  Take over-the-counter and prescription medicines only as told by your health care provider.  Use methods to help relieve the uncomfortable sensations, such as: ? Massaging your legs. ? Walking or stretching. ? Taking a cold or hot bath.  Keep all follow-up visits as told by your health care provider. This is important. Lifestyle  Practice good sleep habits. For example, go to bed and get up at the same time every day. Most adults should get 7-9 hours of sleep each night.  Exercise regularly. Try to get at least 30 minutes of exercise most days of the week.  Practice ways of relaxing, such as yoga or meditation.  Avoid caffeine and alcohol.  Do not use any products that contain nicotine or tobacco, such as cigarettes and e-cigarettes. If you need help quitting, ask your health care provider. Contact a health care provider if:  Your symptoms get worse or they do not improve with treatment. Summary  Restless legs syndrome is a condition that causes uncomfortable feelings or sensations in the legs, especially while  sitting or lying down.  The symptoms often interfere with a person's ability to sleep.  This condition is treated by managing the symptoms. You may need to make lifestyle changes or take medicines. This information is not intended to replace advice given to you by your health care provider. Make sure you discuss any questions you have with your health care  provider. Document Revised: 10/15/2017 Document Reviewed: 10/15/2017 Elsevier Patient Education  [employer].  Joint Pain Joint pain (arthralgia) may be caused by many things. Joint pain is likely to go away when you follow instructions from your health care provider for relieving pain at home. However, joint pain can also be caused by conditions that require more treatment. Common causes of joint pain include:  Bruising in the area of the joint.  Injury caused by repeating certain movements too many times (overuse injury).  Age-related joint wear and tear (osteoarthritis).  Buildup of uric acid crystals in the joint (gout).  Inflammation of the joint (rheumatic disease).  Various other forms of arthritis.  Infections of the joint (septic arthritis) or of the bone (osteomyelitis). Your health care provider may recommend that you take pain medicine or wear a supportive device like an elastic bandage, sling, or splint. If your joint pain continues, you may need lab or imaging tests to diagnose the cause of your joint pain. Follow these instructions at home: Managing pain, stiffness, and swelling   If directed, put ice on the painful area. Icing can help to relieve joint pain and swelling. ? Put ice in a plastic bag. ? Place a towel between your skin and the bag. ? Leave the ice on for 20 minutes, 2-3 times a day.  If directed, apply heat to the painful area as often as told by your health care provider. Heat can reduce the stiffness of your muscles and joints. Use the heat source that your health care provider recommends, such as a moist heat pack or a heating pad. ? Place a towel between your skin and the heat source. ? Leave the heat on for 20-30 minutes. ? Remove the heat if your skin turns bright red. This is especially important if you are unable to feel pain, heat, or cold. You may have a greater risk of getting burned.  Move your fingers or toes below the painful joint  often. You can avoid stiffness and lessen swelling by doing this.  If possible, raise (elevate) the painful joint above the level of your heart while you are sitting or lying down. To do this, try putting a few pillows under the painful joint. Activity  Rest the painful joint for as long as directed. Do not do anything that causes or worsens pain.  Begin exercising or stretching the affected area, as told by your health care provider. Ask your health care provider what types of exercise are safe for you. If you have an elastic bandage, sling, or splint:  Wear the supportive device as told by your health care provider. Remove it only as told by your health care provider.  Loosen the device if your fingers or toes below the joint tingle, become numb, or turn cold and blue.  Keep the device clean.  Ask your health care provider if you should remove the device before bathing. You may need to cover it with a watertight covering when you take a bath or a shower. General instructions  Take over-the-counter and prescription medicines only as told by your health care provider.  Do not use any products that contain nicotine or tobacco, such as cigarettes and e-cigarettes. If you need help quitting, ask your health care provider.  Keep all follow-up visits as told by your health care provider. This is important. Contact a health care provider if:  You have pain that gets worse and does not get better with medicine.  Your joint pain does not improve within 3 days.  You have increased bruising or swelling.  You have a fever.  You lose 10 lb (4.5 kg) or more without trying. Get help right away if:  You cannot move the joint.  Your fingers or toes tingle, become numb, or turn cold and blue.  You have a fever along with a joint that is red, warm, and swollen. Summary  Joint pain (arthralgia) may be caused by many things.  Your health care provider may recommend that you take pain  medicine or wear a supportive device like an elastic bandage, sling, or splint.  If your joint pain continues, you may need tests to diagnose the cause of your joint pain.  Take over-the-counter and prescription medicines only as told by your health care provider. This information is not intended to replace advice given to you by your health care provider. Make sure you discuss any questions you have with your health care provider. Document Revised: 09/07/2017 Document Reviewed: 07/11/2017 Elsevier Patient Education  2020 [employer]  Radicular Pain Radicular pain is a type of pain that spreads from your back or neck along a spinal nerve. Spinal nerves are nerves that leave the spinal cord and go to the muscles. Radicular pain is sometimes called radiculopathy, radiculitis, or a pinched nerve. When you have this type of pain, you may also have weakness, numbness, or tingling in the area of your body that is supplied by the nerve. The pain may feel sharp and burning. Depending on which spinal nerve is affected, the pain may occur in the:  Neck area (cervical radicular pain). You may also feel pain, numbness, weakness, or tingling in the arms.  Mid-spine area (thoracic radicular pain). You would feel this pain in the back and chest. This type is rare.  Lower back area (lumbar radicular pain). You would feel this pain as low back pain. You may feel pain, numbness, weakness, or tingling in the buttocks or legs. Sciatica is a type of lumbar radicular pain that shoots down the back of the leg. Radicular pain occurs when one of the spinal nerves becomes irritated or squeezed (compressed). It is often caused by something pushing on a spinal nerve, such as one of the bones of the spine (vertebrae) or one of the round cushions between vertebrae (intervertebral disks). This can result from:  An injury.  Wear and tear or aging of a disk.  The growth of a bone spur that pushes on the nerve. Radicular  pain often goes away when you follow instructions from your health care provider for relieving pain at home. Follow these instructions at home: Managing pain      If directed, put ice on the affected area: ? Put ice in a plastic bag. ? Place a towel between your skin and the bag. ? Leave the ice on for 20 minutes, 2-3 times a day.  If directed, apply heat to the affected area as often as told by your health care provider. Use the heat source that your health care provider recommends, such as a moist heat pack or a heating pad. ? Place  a towel between your skin and the heat source. ? Leave the heat on for 20-30 minutes. ? Remove the heat if your skin turns bright red. This is especially important if you are unable to feel pain, heat, or cold. You may have a greater risk of getting burned. Activity   Do not sit or rest in bed for long periods of time.  Try to stay as active as possible. Ask your health care provider what type of exercise or activity is best for you.  Avoid activities that make your pain worse, such as bending and lifting.  Do not lift anything that is heavier than 10 lb (4.5 kg), or the limit that you are told, until your health care provider says that it is safe.  Practice using proper technique when lifting items. Proper lifting technique involves bending your knees and rising up.  Do strength and range-of-motion exercises only as told by your health care provider or physical therapist. General instructions  Take over-the-counter and prescription medicines only as told by your health care provider.  Pay attention to any changes in your symptoms.  Keep all follow-up visits as told by your health care provider. This is important. ? Your health care provider may send you to a physical therapist to help with this pain. Contact a health care provider if:  Your pain and other symptoms get worse.  Your pain medicine is not helping.  Your pain has not improved  after a few weeks of home care.  You have a fever. Get help right away if:  You have severe pain, weakness, or numbness.  You have difficulty with bladder or bowel control. Summary  Radicular pain is a type of pain that spreads from your back or neck along a spinal nerve.  When you have radicular pain, you may also have weakness, numbness, or tingling in the area of your body that is supplied by the nerve.  The pain may feel sharp or burning.  Radicular pain may be treated with ice, heat, medicines, or physical therapy. This information is not intended to replace advice given to you by your health care provider. Make sure you discuss any questions you have with your health care provider. Document Revised: 04/09/2018 Document Reviewed: 04/09/2018 Elsevier Patient Education  2020 [employer].

## 2020-04-02 ENCOUNTER — Ambulatory Visit (HOSPITAL_COMMUNITY): Payer: Medicare Other

## 2020-08-12 ENCOUNTER — Ambulatory Visit (HOSPITAL_COMMUNITY)
Admission: RE | Admit: 2020-08-12 | Discharge: 2020-08-12 | Disposition: A | Discharge status: Home / Self Care | Payer: Medicare Other | Source: Ambulatory Visit | Attending: Internal Medicine | Admitting: Internal Medicine

## 2020-08-12 ENCOUNTER — Other Ambulatory Visit: Payer: Self-pay

## 2020-08-12 DIAGNOSIS — Z1231 Encounter for screening mammogram for malignant neoplasm of breast: Secondary | ICD-10-CM

## 2020-09-22 ENCOUNTER — Other Ambulatory Visit: Payer: Self-pay

## 2020-09-22 ENCOUNTER — Telehealth: Payer: Self-pay | Admitting: Neurology

## 2020-09-22 ENCOUNTER — Ambulatory Visit (INDEPENDENT_AMBULATORY_CARE_PROVIDER_SITE_OTHER): Payer: Medicare Other | Admitting: Neurology

## 2020-09-22 ENCOUNTER — Encounter: Payer: Self-pay | Admitting: Neurology

## 2020-09-22 VITALS — BP 148/83 | HR 77 | Ht 66.5 in | Wt 178.0 lb

## 2020-09-22 DIAGNOSIS — M5441 Lumbago with sciatica, right side: Secondary | ICD-10-CM

## 2020-09-22 DIAGNOSIS — M545 Low back pain, unspecified: Secondary | ICD-10-CM | POA: Insufficient documentation

## 2020-09-22 DIAGNOSIS — G4489 Other headache syndrome: Secondary | ICD-10-CM

## 2020-09-22 DIAGNOSIS — G8929 Other chronic pain: Secondary | ICD-10-CM | POA: Diagnosis not present

## 2020-09-22 MED ORDER — VENLAFAXINE HCL ER 37.5 MG PO CP24: 75.0000 mg | ORAL_CAPSULE | Freq: Every day | ORAL | 1 refills | Status: DC

## 2020-09-22 MED ORDER — ZONISAMIDE 25 MG PO CAPS
25.0000 mg | ORAL_CAPSULE | Freq: Every day | ORAL | 5 refills | Status: DC
Start: 2020-09-22 — End: 2022-07-08

## 2020-09-22 NOTE — Telephone Encounter (Signed)
[employer] medicare/medicaid order sent to [facility]. No auth they will reach out to the patient to schedule.  °

## 2020-09-22 NOTE — Progress Notes (Signed)
PATIENT: Beth Meza DOB: 1946/01/22  REASON FOR VISIT: follow up HISTORY FROM: patient  HISTORY OF PRESENT ILLNESS: Today 09/22/20 Beth Meza is a 74 year old female with history of chronic headache, dizziness, and mild memory disorder.  MRI of the brain previously has been normal.  At one time she was on Effexor and Zonegran.  Currently just taking Effexor, she ran out of Zonegran prior to visit Jan 2020.  Since last seen, complains of increased headache and dizziness, symptoms occur together. She says happening every 2-3 hours, are brief, could be sitting or standing, headache across forehead.  Didn't mention this to her son, has gotten used to it.  Today, wants to discuss her right leg pain (to orthopedic she reported left leg pain in June, they felt more neuropathy, RLS).  She mentions no paresthesia, other than numbness in her feet for years.  Does have diabetes, reportedly well controlled, A1c under 7.  On Farxiga.  Pain to the low back, radiates down the right leg, seems to then localized to the right knee.  Has been worsening for the last year, to the point she has to use a cane.  She was given bilateral knee injections in June at orthopedics, does not remember if they were helpful.  No falls recently, but she be off balance with standing.  In 2017, had L3-4 and L4-5 spinal stenosis, had lumbar laminectomy with Dr. Lovell SheehanJenkins.  She is a limited historian, her son accompanies her, does not provide much collateral information.   HISTORY 03/29/2020 SS: Beth Meza is a 74 year old female with history of chronic headache, dizziness, and mild memory disorder.  She has had MRI of the brain previously that was normal.  She was last seen at this office in January 2020.  She had improvement of her symptoms with Zonegran and Effexor previously, but ran out of Zonegran. Is currently taking Effexor-XR 75 mg daily.  Continues to have chronic daily dull headache, frontal, may occasionally take Tylenol,  does not impair her daily activity.  Effexor has been more than 50% beneficial.  Symptoms remain well controlled, has not had significant episode of vertigo, has had a few mild events, may have more significant headache with episodes.  She lives alone, but has an aide who comes in, her children check on her.  No recent falls, she uses a cane.  She has not driven since rotator cuff repair last year.  She feels her condition is overall stable.  She presents today for evaluation unaccompanied.   REVIEW OF SYSTEMS: Out of a complete 14 system review of symptoms, the patient complains only of the following symptoms, and all other reviewed systems are negative.  Back pain, right leg pain  ALLERGIES: No Known Allergies  HOME MEDICATIONS: Outpatient Medications Prior to Visit  Medication Sig Dispense Refill  . cyclobenzaprine (FLEXERIL) 10 MG tablet Take 10 mg by mouth 3 (three) times daily as needed for muscle spasms.    Marland Kitchen. amLODipine (NORVASC) 5 MG tablet Take 1 tablet by mouth daily.    . diclofenac sodium (VOLTAREN) 1 % GEL Apply 2 g topically 4 (four) times daily as needed (pain). 100 g 0  . donepezil (ARICEPT) 5 MG tablet Take 1 tablet by mouth daily.    . famotidine (PEPCID) 20 MG tablet Take 20 mg by mouth 2 (two) times daily.    . Ferrous Gluconate (IRON 27 PO) Take 27 mg by mouth 2 (two) times daily.    Marland Kitchen. lisinopril-hydrochlorothiazide (PRINZIDE,ZESTORETIC) 20-12.5  MG tablet Take 1 tablet by mouth daily.    . meloxicam (MOBIC) 7.5 MG tablet Take 1 tablet (7.5 mg total) by mouth daily. 30 tablet 5  . Multiple Vitamins-Minerals (SENIOR MULTIVITAMIN PLUS PO) Take 1 tablet by mouth daily.    . Omega-3 Fatty Acids (FISH OIL) 1000 MG CAPS Take 1,000 mg by mouth daily.     . pravastatin (PRAVACHOL) 40 MG tablet Take 40 mg by mouth daily.    Marland Kitchen venlafaxine XR (EFFEXOR-XR) 37.5 MG 24 hr capsule TAKE 2 CAPSULES (75 MG TOTAL) BY MOUTH DAILY WITH BREAKFAST. 180 capsule 1  . vitamin C (ASCORBIC ACID) 500  MG tablet Take 500 mg by mouth daily.     No facility-administered medications prior to visit.    PAST MEDICAL HISTORY: Past Medical History:  Diagnosis Date  . Anemia   . Back spasm   . Degenerative arthritis   . Diabetes (HCC)    diet controlled  . Dizziness and giddiness 03/07/2013  . Dyslipidemia   . Gastroesophageal reflux disease   . Hand cramps   . Headache syndrome 11/07/2018  . Headache(784.0) 03/07/2013  . History of carpal tunnel syndrome    Bilateral  . HOH (hard of hearing)   . Hypertension   . Leg cramps   . Memory deficit 02/25/2014  . Seizures (HCC)    as child- unknown etiology-no meds    PAST SURGICAL HISTORY: Past Surgical History:  Procedure Laterality Date  . ANKLE SURGERY Left    History of ankle fracture  . BACK SURGERY    . COLONOSCOPY N/A 05/04/2014   Procedure: COLONOSCOPY;  Surgeon: West Bali, MD;  Location: [facility];  Service: Endoscopy;  Laterality: N/A;  11:30  . HAND SURGERY     Carpal tunnel syndrome, trigger fingers bilaterally  . KNEE SURGERY Left    Pin, history of fracture  . LUMBAR LAMINECTOMY/DECOMPRESSION MICRODISCECTOMY N/A 10/29/2017   Procedure: LUMBAR LAMINECTOMY AND FORAMINOTOMY LUMBAR THREE- LUMBAR FOUR, LUMBAR FOUR- LUMBAR FIVE;  Surgeon: Tressie Stalker, MD;  Location: [facility] OR;  Service: Neurosurgery;  Laterality: N/A;  . plate in let leg    . SHOULDER ACROMIOPLASTY Left 06/10/2014   Procedure: SHOULDER ACROMIOPLASTY;  Surgeon: Vickki Hearing, MD;  Location: [facility];  Service: Orthopedics;  Laterality: Left;  . SHOULDER OPEN ROTATOR CUFF REPAIR Left 04/08/2014   Dr. Romeo Apple  . SHOULDER OPEN ROTATOR CUFF REPAIR Left 06/10/2014   Procedure: REVISION ROTATOR CUFF REPAIR SHOULDER OPEN WITH GRAFT;  Surgeon: Vickki Hearing, MD;  Location: [facility];  Service: Orthopedics;  Laterality: Left;  . SHOULDER OPEN ROTATOR CUFF REPAIR Right 10/29/2018   Procedure: OPEN ROTATOR CUFF REPAIR WITH ACHROMIOPLASTY;  Surgeon: Vickki Hearing, MD;  Location: [facility];  Service: Orthopedics;  Laterality: Right;  . TUBAL LIGATION      FAMILY HISTORY: Family History  Problem Relation Age of Onset  . Heart attack Father   . Colon cancer Neg Hx     SOCIAL HISTORY: Social History   Socioeconomic History  . Marital status: Divorced    Spouse name: Not on file  . Number of children: 3  . Years of education: 36  . Highest education level: Not on file  Occupational History  . Occupation: Retired    Comment: Agricultural consultant  Tobacco Use  . Smoking status: Never Smoker  . Smokeless tobacco: Never Used  Vaping Use  . Vaping Use: Never used  Substance and Sexual Activity  . Alcohol use:  No  . Drug use: No  . Sexual activity: Yes    Birth control/protection: Post-menopausal  Other Topics Concern  . Not on file  Social History Narrative   Patient is right handed.   Patient does not drink caffeine.   Social Determinants of Health   Financial Resource Strain: Not on file  Food Insecurity: Not on file  Transportation Needs: Not on file  Physical Activity: Not on file  Stress: Not on file  Social Connections: Not on file  Intimate Partner Violence: Not on file   PHYSICAL EXAM  Vitals:   09/22/20 0848  BP: (!) 148/83  Pulse: 77  Weight: 178 lb (80.7 kg)  Height: 5' 6.5" (1.689 m)   Body mass index is 28.3 kg/m.  Generalized: Well developed, in no acute distress   Neurological examination  Mentation: Alert oriented to time, place, limited historian. Follows all commands speech and language fluent Cranial nerve II-XII: Pupils were equal round reactive to light. Extraocular movements were full, visual field were full on confrontational test. Facial sensation and strength were normal. Head turning and shoulder shrug  were normal and symmetric. Motor: Good strength to all extremities, but 4/5 right hip flexion, related to prompting pain Sensory: Sensation is intact to soft touch, pinprick, vibration to  bilateral lower extremities Coordination: Cerebellar testing reveals good finger-nose-finger and heel-to-shin bilaterally.  Gait and station: Has to push off from seated position, gait is wide-based, right foot tends to rotate outward, gait is cautious, uses a cane Reflexes: Deep tendon reflexes are symmetric and normal bilaterally.   DIAGNOSTIC DATA (LABS, IMAGING, TESTING) - I reviewed patient records, labs, notes, testing and imaging myself where available.  Lab Results  Component Value Date   WBC 7.5 07/17/2019   HGB 12.2 07/17/2019   HCT 38.7 07/17/2019   MCV 80.1 07/17/2019   PLT 282 07/17/2019      Component Value Date/Time   NA 139 07/17/2019 1200   K 4.4 07/17/2019 1200   CL 106 07/17/2019 1200   CO2 17 (L) 07/17/2019 1200   GLUCOSE 104 (H) 07/17/2019 1200   BUN QUANTITY NOT SUFFICIENT, UNABLE TO PERFORM TEST 07/17/2019 1200   CREATININE 0.55 07/17/2019 1200   CREATININE 1.88 (H) 05/12/2014 1040   CALCIUM 9.6 07/17/2019 1200   PROT 9.0 (H) 11/04/2007 0009   ALBUMIN 4.2 11/04/2007 0009   AST 115 (H) 11/04/2007 0009   ALT 74 (H) 11/04/2007 0009   ALKPHOS 62 11/04/2007 0009   BILITOT 0.7 11/04/2007 0009   GFRNONAA >60 07/17/2019 1200   GFRAA >60 07/17/2019 1200   No results found for: CHOL, HDL, LDLCALC, LDLDIRECT, TRIG, CHOLHDL Lab Results  Component Value Date   HGBA1C 6.2 (H) 10/24/2018   No results found for: VITAMINB12 No results found for: TSH  ASSESSMENT AND PLAN 74 y.o. year old female  has a past medical history of Anemia, Back spasm, Degenerative arthritis, Diabetes (HCC), Dizziness and giddiness (03/07/2013), Dyslipidemia, Gastroesophageal reflux disease, Hand cramps, Headache syndrome (11/07/2018), Headache(784.0) (03/07/2013), History of carpal tunnel syndrome, HOH (hard of hearing), Hypertension, Leg cramps, Memory deficit (02/25/2014), and Seizures (HCC). here with:  1.  Chronic dizziness 2.  Chronic daily headache 3.  Mild memory  disturbance -Reportedly more frequent headache and dizziness -Add back in Zonegran 25 mg daily -Continue Effexor XR 75 mg daily  4.  Low back pain, radiating down the right leg, history of lumbar laminectomy -Was difficult to get a good history, and localize the problem, she seems to  mostly be concerned with the low back pain, that has worsened over the last year, making walking difficult, and balance altered, requiring use of cane consistently.  The pain seems to radiate down the right leg, but also does have localized pain to the right knee. -Will start with an MRI of the lumbar spine, consider NCV/EMG, she likely has a neuropathy (DM history), other than numbness to the feet for years, does not describe any other paresthesia to the lower extremities -Did encourage her to follow back up with orthopedics, concerning the right knee pain, continue routine follow-up with PCP -Follow-up in 6 months or sooner if needed  DG Lumbar Spine 2-3 Views 03/31/2020 Lumbar spine x-rays patient has had surgery in the past  Good noted lumbar lordosisMild facet arthritis  Moderate lumbar scoliosis apex L3  Impression degenerative spondylolow cyst with degenerative scoliosis  I spent 30 minutes of face-to-face and non-face-to-face time with patient.  This included previsit chart review, lab review, study review, order entry, electronic health record documentation, patient education.  Margie Ege, AGNP-C, DNP 09/22/2020, 8:50 AM [facility] [address], [address] [place], [place] [address] [phone]

## 2020-09-22 NOTE — Progress Notes (Signed)
I have read the note, and I agree with the clinical assessment and plan.   K    

## 2020-09-22 NOTE — Patient Instructions (Signed)
Add in Zonegran 25 mg daily  Continue the Effexor at current dose Check MRI of Lumbar Spine  See orthopedics for follow-up for knee See you back in 6 months

## 2020-12-29 ENCOUNTER — Other Ambulatory Visit: Payer: Self-pay

## 2020-12-29 ENCOUNTER — Ambulatory Visit (HOSPITAL_COMMUNITY)
Admission: RE | Admit: 2020-12-29 | Discharge: 2020-12-29 | Disposition: A | Discharge status: Home / Self Care | Payer: Medicare Other | Source: Ambulatory Visit | Attending: Neurology | Admitting: Neurology

## 2020-12-29 DIAGNOSIS — M5441 Lumbago with sciatica, right side: Secondary | ICD-10-CM | POA: Insufficient documentation

## 2020-12-29 DIAGNOSIS — G8929 Other chronic pain: Secondary | ICD-10-CM | POA: Insufficient documentation

## 2020-12-30 ENCOUNTER — Telehealth: Payer: Self-pay | Admitting: Neurology

## 2020-12-30 NOTE — Telephone Encounter (Signed)
I called pt was connected with her aid.  Requested a call back with aid. She sts the pt was currently asleep when I called.

## 2020-12-30 NOTE — Telephone Encounter (Addendum)
Pt called me back and we reviewed results. Pt reports her back pain is still problematic for her along with associated pain her her legs. Pt requested an earlier appt to discuss findings of MRI along with sx. I explained to the pt we could scheduled a NCS/EMG first before scheduling her a f/u to see if a cause for sx could be found, but pt declined. Pt is agreeable to MRI being sent to Dr. Lovell Sheehan but requested an appt for April.   To note pt was under the impression she had an appt for April already scheduled. I could not locate in epic, she also she was hard of hearing on the phone.  F/u has been scheduled and have requested medical records to send report.

## 2020-12-30 NOTE — Telephone Encounter (Signed)
Please send MRI to Dr. Lovell Sheehan, if he wants nerve conduction, we can do that, would like to get his recommendation, he may just want to see the patient in the office.  In that case, we do not need to see for follow-up.  Not sure the best way to get his response.

## 2020-12-30 NOTE — Telephone Encounter (Signed)
Please call the patient, last seen in December 2021, complained of right leg pain, low back pain, previous lumbar laminectomy with Dr. Lovell Sheehan in 2017.  Recent MRI of lumbar spine, shows overall abnormalities throughout, several areas for potential neural compression, facet arthritis as mentioned, could be contributing to the back pain, looks like changes in L3-4.   Check on how she is doing, we could send back to Dr. Lovell Sheehan for consult, or I can send copy of MRI to him to see if this is surgical, or do NCV/EMG before to determine if radiculopathy. Please get patient preference on how to proceed.   IMPRESSION: 1. L2-3: Shallow protrusion of the disc. Facet and ligamentous hypertrophy. Multifactorial stenosis, most pronounced in the right lateral recess. Neural compression could occur at this level, particularly in the right lateral recess. The facet arthritis could contribute to back pain. Similar appearance to the study of 2019. 2. L3-4: Previous posterior decompression. 3 mm degenerative anterolisthesis. Disc degeneration more pronounced on the right than the left. Discogenic endplate marrow changes which could relate to back pain. Stenosis of the lateral recesses and foramina right more than left that could cause neural compression on either or both sides, more likely the right. Protruding disc material in the left foraminal to extraforaminal region could also focally affect the left L3 nerve. The facet arthritis could also be a cause of back pain. 3. L4-5: Previous posterior decompression. Mild bilateral foraminal narrowing, not grossly compressive. 4. L5-S1: Endplate osteophytes and bulging of the disc. Facet degeneration and hypertrophy. Bilateral foraminal narrowing that would have some potential to affect the exiting L5 nerves. Similar appearance to the study of 2019.

## 2021-02-03 ENCOUNTER — Encounter: Payer: Self-pay | Admitting: Neurology

## 2021-02-03 ENCOUNTER — Ambulatory Visit (INDEPENDENT_AMBULATORY_CARE_PROVIDER_SITE_OTHER): Payer: Medicare Other | Admitting: Neurology

## 2021-02-03 VITALS — BP 162/83 | HR 65 | Ht 66.5 in | Wt 173.0 lb

## 2021-02-03 DIAGNOSIS — G8929 Other chronic pain: Secondary | ICD-10-CM

## 2021-02-03 DIAGNOSIS — M5441 Lumbago with sciatica, right side: Secondary | ICD-10-CM

## 2021-02-03 NOTE — Patient Instructions (Addendum)
Referral to neurosurgery to review MRI  See you back in 6 months

## 2021-02-03 NOTE — Progress Notes (Signed)
I have read the note, and I agree with the clinical assessment and plan.   K    

## 2021-02-03 NOTE — Progress Notes (Signed)
PATIENT: Beth Meza DOB: 05-18-1946  REASON FOR VISIT: follow up HISTORY FROM: patient  HISTORY OF PRESENT ILLNESS: Today 02/03/21 Beth Meza is a 75 year old female with history of chronic headache, dizziness, mild memory disorder, and right leg pain.  In 2017, had L3-4 and L4-5 spinal stenosis, had lumbar laminectomy with Dr. Lovell Sheehan.  Had MRI of the lumbar spine in March 2022, most issue at L2-3, L3-4 potential for neural compression could occur, facet arthritis is present.  Continues to report low back pain, radiates down both legs, but mostly the right leg.  She has not had any recent falls, was falling fairly frequently several months ago.  She is using a cane.  She lives with her son.  Describes a crawling sensation in the legs usually at night.  Denies any urinary or bowel incontinence.  Denies any weakness of the legs.  Here today to discuss MRI results, several years ago, reported steroid injection, unclear if helpful.  Reports diabetes, but well controlled.  Here today for evaluation accompanied by her son.  Update 09/22/2020 SS: Beth Meza is a 75 year old female with history of chronic headache, dizziness, and mild memory disorder.  MRI of the brain previously has been normal.  At one time she was on Effexor and Zonegran.  Currently just taking Effexor, she ran out of Zonegran prior to visit Jan 2020.  Since last seen, complains of increased headache and dizziness, symptoms occur together. She says happening every 2-3 hours, are brief, could be sitting or standing, headache across forehead.  Didn't mention this to her son, has gotten used to it.  Today, wants to discuss her right leg pain (to orthopedic she reported left leg pain in June, they felt more neuropathy, RLS).  She mentions no paresthesia, other than numbness in her feet for years.  Does have diabetes, reportedly well controlled, A1c under 7.  On Farxiga.  Pain to the low back, radiates down the right leg, seems to then  localized to the right knee.  Has been worsening for the last year, to the point she has to use a cane.  She was given bilateral knee injections in June at orthopedics, does not remember if they were helpful.  No falls recently, but she be off balance with standing.  In 2017, had L3-4 and L4-5 spinal stenosis, had lumbar laminectomy with Dr. Lovell Sheehan.  She is a limited historian, her son accompanies her, does not provide much collateral information.   HISTORY 03/29/2020 SS: Beth Meza is a 75 year old female with history of chronic headache, dizziness, and mild memory disorder.  She has had MRI of the brain previously that was normal.  She was last seen at this office in January 2020.  She had improvement of her symptoms with Zonegran and Effexor previously, but ran out of Zonegran. Is currently taking Effexor-XR 75 mg daily.  Continues to have chronic daily dull headache, frontal, may occasionally take Tylenol, does not impair her daily activity.  Effexor has been more than 50% beneficial.  Symptoms remain well controlled, has not had significant episode of vertigo, has had a few mild events, may have more significant headache with episodes.  She lives alone, but has an aide who comes in, her children check on her.  No recent falls, she uses a cane.  She has not driven since rotator cuff repair last year.  She feels her condition is overall stable.  She presents today for evaluation unaccompanied.   REVIEW OF SYSTEMS: Out of  a complete 14 system review of symptoms, the patient complains only of the following symptoms, and all other reviewed systems are negative.  Back pain, right leg pain  ALLERGIES: No Known Allergies  HOME MEDICATIONS: Outpatient Medications Prior to Visit  Medication Sig Dispense Refill  . amLODipine (NORVASC) 5 MG tablet Take 1 tablet by mouth daily.    . cyclobenzaprine (FLEXERIL) 10 MG tablet Take 10 mg by mouth 3 (three) times daily as needed for muscle spasms.    . diclofenac  sodium (VOLTAREN) 1 % GEL Apply 2 g topically 4 (four) times daily as needed (pain). 100 g 0  . donepezil (ARICEPT) 5 MG tablet Take 1 tablet by mouth daily.    . famotidine (PEPCID) 20 MG tablet Take 20 mg by mouth 2 (two) times daily.    Marland Kitchen FARXIGA 10 MG TABS tablet Take 10 mg by mouth daily.    . Ferrous Gluconate (IRON 27 PO) Take 27 mg by mouth 2 (two) times daily.    Marland Kitchen lisinopril-hydrochlorothiazide (PRINZIDE,ZESTORETIC) 20-12.5 MG tablet Take 1 tablet by mouth daily.    . meloxicam (MOBIC) 7.5 MG tablet Take 1 tablet (7.5 mg total) by mouth daily. 30 tablet 5  . Multiple Vitamins-Minerals (SENIOR MULTIVITAMIN PLUS PO) Take 1 tablet by mouth daily.    . Omega-3 Fatty Acids (FISH OIL) 1000 MG CAPS Take 1,000 mg by mouth daily.     . pravastatin (PRAVACHOL) 40 MG tablet Take 40 mg by mouth daily.    Marland Kitchen venlafaxine XR (EFFEXOR-XR) 37.5 MG 24 hr capsule Take 2 capsules (75 mg total) by mouth daily with breakfast. 180 capsule 1  . vitamin C (ASCORBIC ACID) 500 MG tablet Take 500 mg by mouth daily.    Marland Kitchen zonisamide (ZONEGRAN) 25 MG capsule Take 1 capsule (25 mg total) by mouth daily. 30 capsule 5   No facility-administered medications prior to visit.    PAST MEDICAL HISTORY: Past Medical History:  Diagnosis Date  . Anemia   . Back spasm   . Degenerative arthritis   . Diabetes (HCC)    diet controlled  . Dizziness and giddiness 03/07/2013  . Dyslipidemia   . Gastroesophageal reflux disease   . Hand cramps   . Headache syndrome 11/07/2018  . Headache(784.0) 03/07/2013  . History of carpal tunnel syndrome    Bilateral  . HOH (hard of hearing)   . Hypertension   . Leg cramps   . Memory deficit 02/25/2014  . Seizures (HCC)    as child- unknown etiology-no meds    PAST SURGICAL HISTORY: Past Surgical History:  Procedure Laterality Date  . ANKLE SURGERY Left    History of ankle fracture  . BACK SURGERY    . COLONOSCOPY N/A 05/04/2014   Procedure: COLONOSCOPY;  Surgeon: West Bali, MD;  Location: [facility];  Service: Endoscopy;  Laterality: N/A;  11:30  . HAND SURGERY     Carpal tunnel syndrome, trigger fingers bilaterally  . KNEE SURGERY Left    Pin, history of fracture  . LUMBAR LAMINECTOMY/DECOMPRESSION MICRODISCECTOMY N/A 10/29/2017   Procedure: LUMBAR LAMINECTOMY AND FORAMINOTOMY LUMBAR THREE- LUMBAR FOUR, LUMBAR FOUR- LUMBAR FIVE;  Surgeon: Tressie Stalker, MD;  Location: [facility] OR;  Service: Neurosurgery;  Laterality: N/A;  . plate in let leg    . SHOULDER ACROMIOPLASTY Left 06/10/2014   Procedure: SHOULDER ACROMIOPLASTY;  Surgeon: Vickki Hearing, MD;  Location: [facility];  Service: Orthopedics;  Laterality: Left;  . SHOULDER OPEN ROTATOR CUFF REPAIR Left  04/08/2014   Dr. Romeo Apple  . SHOULDER OPEN ROTATOR CUFF REPAIR Left 06/10/2014   Procedure: REVISION ROTATOR CUFF REPAIR SHOULDER OPEN WITH GRAFT;  Surgeon: Vickki Hearing, MD;  Location: [facility];  Service: Orthopedics;  Laterality: Left;  . SHOULDER OPEN ROTATOR CUFF REPAIR Right 10/29/2018   Procedure: OPEN ROTATOR CUFF REPAIR WITH ACHROMIOPLASTY;  Surgeon: Vickki Hearing, MD;  Location: [facility];  Service: Orthopedics;  Laterality: Right;  . TUBAL LIGATION      FAMILY HISTORY: Family History  Problem Relation Age of Onset  . Heart attack Father   . Colon cancer Neg Hx     SOCIAL HISTORY: Social History   Socioeconomic History  . Marital status: Divorced    Spouse name: Not on file  . Number of children: 3  . Years of education: 38  . Highest education level: Not on file  Occupational History  . Occupation: Retired    Comment: Agricultural consultant  Tobacco Use  . Smoking status: Never Smoker  . Smokeless tobacco: Never Used  Vaping Use  . Vaping Use: Never used  Substance and Sexual Activity  . Alcohol use: No  . Drug use: No  . Sexual activity: Yes    Birth control/protection: Post-menopausal  Other Topics Concern  . Not on file  Social History Narrative   Patient is right handed.    Patient does not drink caffeine.   Social Determinants of Health   Financial Resource Strain: Not on file  Food Insecurity: Not on file  Transportation Needs: Not on file  Physical Activity: Not on file  Stress: Not on file  Social Connections: Not on file  Intimate Partner Violence: Not on file   PHYSICAL EXAM  Vitals:   02/03/21 0932  BP: (!) 162/83  Pulse: 65  Weight: 173 lb (78.5 kg)  Height: 5' 6.5" (1.689 m)   Body mass index is 27.5 kg/m.  Generalized: Well developed, in no acute distress   Neurological examination  Mentation: Alert oriented to time, place, limited historian. Follows all commands speech and language fluent Cranial nerve II-XII: Pupils were equal round reactive to light. Extraocular movements were full, visual field were full on confrontational test. Facial sensation and strength were normal. Head turning and shoulder shrug  were normal and symmetric. Motor: Good strength to all extremities, mild bilateral hip flexion weakness Sensory: Sensation is intact to soft touch to all extremities Coordination: Cerebellar testing reveals good finger-nose-finger and heel-to-shin bilaterally.  Gait and station: Has to push off from seated position, gait is wide-based,  gait is cautious, uses a cane Reflexes: Deep tendon reflexes are symmetric and normal bilaterally.   DIAGNOSTIC DATA (LABS, IMAGING, TESTING) - I reviewed patient records, labs, notes, testing and imaging myself where available.  Lab Results  Component Value Date   WBC 7.5 07/17/2019   HGB 12.2 07/17/2019   HCT 38.7 07/17/2019   MCV 80.1 07/17/2019   PLT 282 07/17/2019      Component Value Date/Time   NA 139 07/17/2019 1200   K 4.4 07/17/2019 1200   CL 106 07/17/2019 1200   CO2 17 (L) 07/17/2019 1200   GLUCOSE 104 (H) 07/17/2019 1200   BUN QUANTITY NOT SUFFICIENT, UNABLE TO PERFORM TEST 07/17/2019 1200   CREATININE 0.55 07/17/2019 1200   CREATININE 1.88 (H) 05/12/2014 1040   CALCIUM  9.6 07/17/2019 1200   PROT 9.0 (H) 11/04/2007 0009   ALBUMIN 4.2 11/04/2007 0009   AST 115 (H) 11/04/2007 0009   ALT 74 (  H) 11/04/2007 0009   ALKPHOS 62 11/04/2007 0009   BILITOT 0.7 11/04/2007 0009   GFRNONAA >60 07/17/2019 1200   GFRAA >60 07/17/2019 1200   No results found for: CHOL, HDL, LDLCALC, LDLDIRECT, TRIG, CHOLHDL Lab Results  Component Value Date   HGBA1C 6.2 (H) 10/24/2018   No results found for: VITAMINB12 No results found for: TSH  ASSESSMENT AND PLAN 75 y.o. year old female  has a past medical history of Anemia, Back spasm, Degenerative arthritis, Diabetes (HCC), Dizziness and giddiness (03/07/2013), Dyslipidemia, Gastroesophageal reflux disease, Hand cramps, Headache syndrome (11/07/2018), Headache(784.0) (03/07/2013), History of carpal tunnel syndrome, HOH (hard of hearing), Hypertension, Leg cramps, Memory deficit (02/25/2014), and Seizures (HCC). here with:  1.  Chronic dizziness 2.  Chronic daily headache 3.  Mild memory disturbance -We did not discuss today, will address at next office visit  4.  Low back pain, radiating down the right leg, history of lumbar laminectomy -MRI of the lumbar spine in March 2022 shows overall abnormalities throughout, several areas for potential neural compression, facet arthritis was mentioned could be contributing to back pain, changes in L3-4 -Due to low back pain, radiating down both legs, but mostly the right, offered ESI injection, she and her son prefer to see Dr. Lovell SheehanJenkins first, get his opinion, they then may decide to proceed with ESI if felt appropriate, possibly could be done through pain management portion of their office -We will place a referral to neurosurgery -Follow-up in 6 months or sooner if needed office  MRI lumbar spine 12/30/2020  IMPRESSION: 1. L2-3: Shallow protrusion of the disc. Facet and ligamentous hypertrophy. Multifactorial stenosis, most pronounced in the right lateral recess. Neural compression could  occur at this level, particularly in the right lateral recess. The facet arthritis could contribute to back pain. Similar appearance to the study of 2019. 2. L3-4: Previous posterior decompression. 3 mm degenerative anterolisthesis. Disc degeneration more pronounced on the right than the left. Discogenic endplate marrow changes which could relate to back pain. Stenosis of the lateral recesses and foramina right more than left that could cause neural compression on either or both sides, more likely the right. Protruding disc material in the left foraminal to extraforaminal region could also focally affect the left L3 nerve. The facet arthritis could also be a cause of back pain. 3. L4-5: Previous posterior decompression. Mild bilateral foraminal narrowing, not grossly compressive. 4. L5-S1: Endplate osteophytes and bulging of the disc. Facet degeneration and hypertrophy. Bilateral foraminal narrowing that would have some potential to affect the exiting L5 nerves. Similar appearance to the study of 2019.  Margie EgeSarah , AGNP-C, DNP 02/03/2021, 10:33 AM [facility] [address], [address] [place], [place] [id number] [phone]

## 2021-02-07 ENCOUNTER — Telehealth: Payer: Self-pay | Admitting: Neurology

## 2021-02-07 NOTE — Telephone Encounter (Signed)
Faxed referral to Dr. [facility]. Fax #[phone], phone #[phone].

## 2021-03-03 ENCOUNTER — Other Ambulatory Visit: Payer: Self-pay | Admitting: Emergency Medicine

## 2021-03-03 MED ORDER — VENLAFAXINE HCL ER 37.5 MG PO CP24: 75.0000 mg | ORAL_CAPSULE | Freq: Every day | ORAL | 1 refills | Status: DC

## 2021-03-23 ENCOUNTER — Ambulatory Visit: Payer: Medicare Other | Admitting: Neurology

## 2021-07-19 ENCOUNTER — Other Ambulatory Visit: Payer: Self-pay | Admitting: Neurosurgery

## 2021-07-19 DIAGNOSIS — M4150 Other secondary scoliosis, site unspecified: Secondary | ICD-10-CM

## 2021-07-26 ENCOUNTER — Other Ambulatory Visit: Payer: Self-pay

## 2021-07-26 ENCOUNTER — Ambulatory Visit
Admission: RE | Admit: 2021-07-26 | Discharge: 2021-07-26 | Disposition: A | Discharge status: Home / Self Care | Payer: Medicare Other | Source: Ambulatory Visit | Attending: Neurosurgery | Admitting: Neurosurgery

## 2021-07-26 DIAGNOSIS — M4150 Other secondary scoliosis, site unspecified: Secondary | ICD-10-CM

## 2021-07-26 MED ORDER — DIAZEPAM 5 MG PO TABS
5.0000 mg | ORAL_TABLET | Freq: Once | ORAL | Status: AC
  Administered 2021-07-26: 5 mg via ORAL

## 2021-07-26 MED ORDER — IOPAMIDOL (ISOVUE-M 200) INJECTION 41%
18.0000 mL | Freq: Once | INTRAMUSCULAR | Status: AC
  Administered 2021-07-26: 18 mL via INTRATHECAL

## 2021-07-26 MED ORDER — ONDANSETRON HCL 4 MG/2ML IJ SOLN: 4.0000 mg | Freq: Once | INTRAMUSCULAR | Status: DC | PRN

## 2021-07-26 MED ORDER — MEPERIDINE HCL 50 MG/ML IJ SOLN: 50.0000 mg | Freq: Once | INTRAMUSCULAR | Status: DC | PRN

## 2021-07-26 NOTE — Discharge Instructions (Signed)

## 2021-07-30 ENCOUNTER — Other Ambulatory Visit: Payer: Self-pay | Admitting: Neurology

## 2021-09-03 ENCOUNTER — Other Ambulatory Visit: Payer: Self-pay

## 2021-09-03 ENCOUNTER — Emergency Department (HOSPITAL_COMMUNITY): Payer: Medicare Other

## 2021-09-03 ENCOUNTER — Encounter (HOSPITAL_COMMUNITY): Payer: Self-pay | Admitting: *Deleted

## 2021-09-03 ENCOUNTER — Emergency Department (HOSPITAL_COMMUNITY)
Admission: EM | Admit: 2021-09-03 | Discharge: 2021-09-03 | Disposition: A | Discharge status: Home / Self Care | Payer: Medicare Other | Attending: Emergency Medicine | Admitting: Emergency Medicine

## 2021-09-03 DIAGNOSIS — I10 Essential (primary) hypertension: Secondary | ICD-10-CM | POA: Diagnosis not present

## 2021-09-03 DIAGNOSIS — R0789 Other chest pain: Secondary | ICD-10-CM | POA: Diagnosis present

## 2021-09-03 DIAGNOSIS — J101 Influenza due to other identified influenza virus with other respiratory manifestations: Secondary | ICD-10-CM | POA: Diagnosis not present

## 2021-09-03 DIAGNOSIS — Z79899 Other long term (current) drug therapy: Secondary | ICD-10-CM | POA: Diagnosis not present

## 2021-09-03 DIAGNOSIS — R0981 Nasal congestion: Secondary | ICD-10-CM | POA: Diagnosis not present

## 2021-09-03 DIAGNOSIS — Z20822 Contact with and (suspected) exposure to covid-19: Secondary | ICD-10-CM | POA: Diagnosis not present

## 2021-09-03 LAB — COMPREHENSIVE METABOLIC PANEL
ALT: 25 U/L (ref 0–44)
AST: 34 U/L (ref 15–41)
Albumin: 4.2 g/dL (ref 3.5–5.0)
Alkaline Phosphatase: 80 U/L (ref 38–126)
Anion gap: 12 (ref 5–15)
BUN: 17 mg/dL (ref 8–23)
CO2: 19 mmol/L — ABNORMAL LOW (ref 22–32)
Calcium: 9 mg/dL (ref 8.9–10.3)
Chloride: 102 mmol/L (ref 98–111)
Creatinine, Ser: 1.52 mg/dL — ABNORMAL HIGH (ref 0.44–1.00)
GFR, Estimated: 36 mL/min — ABNORMAL LOW (ref >60)
Glucose, Bld: 103 mg/dL — ABNORMAL HIGH (ref 70–99)
Potassium: 3.7 mmol/L (ref 3.5–5.1)
Sodium: 133 mmol/L — ABNORMAL LOW (ref 135–145)
Total Bilirubin: 0.7 mg/dL (ref 0.3–1.2)
Total Protein: 7.5 g/dL (ref 6.5–8.1)

## 2021-09-03 LAB — CBC WITH DIFFERENTIAL/PLATELET
Abs Immature Granulocytes: 0.02 10*3/uL (ref 0.00–0.07)
Basophils Absolute: 0 10*3/uL (ref 0.0–0.1)
Basophils Relative: 1 %
Eosinophils Absolute: 0 10*3/uL (ref 0.0–0.5)
Eosinophils Relative: 0 %
HCT: 44.1 % (ref 36.0–46.0)
Hemoglobin: 14.6 g/dL (ref 12.0–15.0)
Immature Granulocytes: 0 %
Lymphocytes Relative: 8 %
Lymphs Abs: 0.7 10*3/uL (ref 0.7–4.0)
MCH: 25.4 pg — ABNORMAL LOW (ref 26.0–34.0)
MCHC: 33.1 g/dL (ref 30.0–36.0)
MCV: 76.8 fL — ABNORMAL LOW (ref 80.0–100.0)
Monocytes Absolute: 0.9 10*3/uL (ref 0.1–1.0)
Monocytes Relative: 11 %
Neutro Abs: 6.7 10*3/uL (ref 1.7–7.7)
Neutrophils Relative %: 80 %
Platelets: 226 10*3/uL (ref 150–400)
RBC: 5.74 MIL/uL — ABNORMAL HIGH (ref 3.87–5.11)
RDW: 15.9 % — ABNORMAL HIGH (ref 11.5–15.5)
WBC: 8.4 10*3/uL (ref 4.0–10.5)
nRBC: 0 % (ref 0.0–0.2)

## 2021-09-03 LAB — RESP PANEL BY RT-PCR (FLU A&B, COVID) ARPGX2
Influenza A by PCR: POSITIVE — AB
Influenza B by PCR: NEGATIVE
SARS Coronavirus 2 by RT PCR: NEGATIVE

## 2021-09-03 LAB — LACTIC ACID, PLASMA: Lactic Acid, Venous: 1 mmol/L (ref 0.5–1.9)

## 2021-09-03 LAB — PROTIME-INR
INR: 1.1 (ref 0.8–1.2)
Prothrombin Time: 14 seconds (ref 11.4–15.2)

## 2021-09-03 MED ORDER — ACETAMINOPHEN 325 MG PO TABS
650.0000 mg | ORAL_TABLET | Freq: Once | ORAL | Status: AC
  Administered 2021-09-03: 650 mg via ORAL
  Filled 2021-09-03: qty 2

## 2021-09-03 NOTE — ED Triage Notes (Signed)
Pt mid chest pain since yesterday with NP cough. Generalized body aches since yesterday as well. Denies any fever or N/V

## 2021-09-03 NOTE — ED Provider Notes (Signed)
[facility] EMERGENCY DEPARTMENT Provider Note   CSN: [id number] Arrival date & time: 09/03/21  1955     History Chief Complaint  Patient presents with   Chest Pain    Beth Meza is a 75 y.o. female.  Patient presents ER chief complaint of generalized body aches fevers and cough congestion.  Symptoms ongoing for 2 days now.  Also complaining of chest tightness with associated with a cough.  Denies any vomiting denies any diarrhea denies any shortness of breath.      Past Medical History:  Diagnosis Date   Anemia    Back spasm    Degenerative arthritis    Diabetes (HCC)    diet controlled   Dizziness and giddiness 03/07/2013   Dyslipidemia    Gastroesophageal reflux disease    Hand cramps    Headache syndrome 11/07/2018   Headache(784.0) 03/07/2013   History of carpal tunnel syndrome    Bilateral   HOH (hard of hearing)    Hypertension    Leg cramps    Memory deficit 02/25/2014   Seizures (HCC)    as child- unknown etiology-no meds    Patient Active Problem List   Diagnosis Date Noted   Low back pain 09/22/2020   Headache syndrome 11/07/2018   S/P right rotator cuff repair right 10/29/2018    Spinal stenosis of lumbar region with neurogenic claudication 10/29/2017   Tremor 09/11/2014   Unspecified constipation 04/30/2014   Rotator cuff (capsule) sprain 04/13/2014   Rotator cuff tear, left 02/26/2014   Memory deficit 02/25/2014   Dizziness and giddiness 03/07/2013   Headache 03/07/2013    Past Surgical History:  Procedure Laterality Date   ANKLE SURGERY Left    History of ankle fracture   BACK SURGERY     COLONOSCOPY N/A 05/04/2014   Procedure: COLONOSCOPY;  Surgeon: West Bali, MD;  Location: [facility];  Service: Endoscopy;  Laterality: N/A;  11:30   HAND SURGERY     Carpal tunnel syndrome, trigger fingers bilaterally   KNEE SURGERY Left    Pin, history of fracture   LUMBAR LAMINECTOMY/DECOMPRESSION MICRODISCECTOMY N/A 10/29/2017    Procedure: LUMBAR LAMINECTOMY AND FORAMINOTOMY LUMBAR THREE- LUMBAR FOUR, LUMBAR FOUR- LUMBAR FIVE;  Surgeon: Tressie Stalker, MD;  Location: [facility] OR;  Service: Neurosurgery;  Laterality: N/A;   plate in let leg     SHOULDER ACROMIOPLASTY Left 06/10/2014   Procedure: SHOULDER ACROMIOPLASTY;  Surgeon: Vickki Hearing, MD;  Location: [facility];  Service: Orthopedics;  Laterality: Left;   SHOULDER OPEN ROTATOR CUFF REPAIR Left 04/08/2014   Dr. Romeo Apple   SHOULDER OPEN ROTATOR CUFF REPAIR Left 06/10/2014   Procedure: REVISION ROTATOR CUFF REPAIR SHOULDER OPEN WITH GRAFT;  Surgeon: Vickki Hearing, MD;  Location: [facility];  Service: Orthopedics;  Laterality: Left;   SHOULDER OPEN ROTATOR CUFF REPAIR Right 10/29/2018   Procedure: OPEN ROTATOR CUFF REPAIR WITH ACHROMIOPLASTY;  Surgeon: Vickki Hearing, MD;  Location: [facility];  Service: Orthopedics;  Laterality: Right;   TUBAL LIGATION       OB History   No obstetric history on file.     Family History  Problem Relation Age of Onset   Heart attack Father    Colon cancer Neg Hx     Social History   Tobacco Use   Smoking status: Never   Smokeless tobacco: Never  Vaping Use   Vaping Use: Never used  Substance Use Topics   Alcohol use: No   Drug use:  No    Home Medications Prior to Admission medications   Medication Sig Start Date End Date Taking? Authorizing Provider  amLODipine (NORVASC) 5 MG tablet Take 1 tablet by mouth daily. 07/10/19   [provider]  cyclobenzaprine (FLEXERIL) 10 MG tablet Take 10 mg by mouth 3 (three) times daily as needed for muscle spasms.    [provider]  diclofenac sodium (VOLTAREN) 1 % GEL Apply 2 g topically 4 (four) times daily as needed (pain). 07/17/19   Long, Arlyss Repress, MD  donepezil (ARICEPT) 5 MG tablet Take 1 tablet by mouth daily. 07/10/19   [provider]  famotidine (PEPCID) 20 MG tablet Take 20 mg by mouth 2 (two) times daily. 01/31/19   [provider]  FARXIGA  10 MG TABS tablet Take 10 mg by mouth daily. 08/30/20   [provider]  Ferrous Gluconate (IRON 27 PO) Take 27 mg by mouth 2 (two) times daily.    [provider]  lisinopril-hydrochlorothiazide (PRINZIDE,ZESTORETIC) 20-12.5 MG tablet Take 1 tablet by mouth daily.    [provider]  meloxicam (MOBIC) 7.5 MG tablet Take 1 tablet (7.5 mg total) by mouth daily. 03/31/20   Vickki Hearing, MD  Multiple Vitamins-Minerals (SENIOR MULTIVITAMIN PLUS PO) Take 1 tablet by mouth daily.    [provider]  Omega-3 Fatty Acids (FISH OIL) 1000 MG CAPS Take 1,000 mg by mouth daily.     [provider]  pravastatin (PRAVACHOL) 40 MG tablet Take 40 mg by mouth daily. 02/11/14   [provider]  venlafaxine XR (EFFEXOR-XR) 37.5 MG 24 hr capsule Take 2 capsules (75 mg total) by mouth daily with breakfast. 03/03/21   York Spaniel, MD  vitamin C (ASCORBIC ACID) 500 MG tablet Take 500 mg by mouth daily.    [provider]  zonisamide (ZONEGRAN) 25 MG capsule Take 1 capsule (25 mg total) by mouth daily. 09/22/20   Glean Salvo, NP    Allergies    Patient has no known allergies.  Review of Systems   Review of Systems  Constitutional:  Positive for fever.  HENT:  Negative for ear pain.   Eyes:  Negative for pain.  Respiratory:  Positive for cough.   Cardiovascular:  Positive for chest pain.  Gastrointestinal:  Negative for abdominal pain.  Genitourinary:  Negative for flank pain.  Musculoskeletal:  Negative for back pain.  Skin:  Negative for rash.  Neurological:  Negative for headaches.   Physical Exam Updated Vital Signs BP 137/78   Pulse 95   Temp (!) 101.4 F (38.6 C) (Oral)   Resp 15   Ht 5\' 7"  (1.702 m)   Wt 81.6 kg   SpO2 97%   BMI 28.19 kg/m   Physical Exam Constitutional:      General: She is not in acute distress.    Appearance: Normal appearance.  HENT:     Head: Normocephalic.     Nose: Nose normal.  Eyes:      Extraocular Movements: Extraocular movements intact.  Cardiovascular:     Rate and Rhythm: Normal rate.  Pulmonary:     Effort: Pulmonary effort is normal.     Breath sounds: No decreased breath sounds or wheezing.  Musculoskeletal:        General: Normal range of motion.     Cervical back: Normal range of motion.  Neurological:     General: No focal deficit present.     Mental Status: She is alert.  Mental status is at baseline.    ED Results / Procedures / Treatments   Labs (all labs ordered are listed, but only abnormal results are displayed) Labs Reviewed  RESP PANEL BY RT-PCR (FLU A&B, COVID) ARPGX2 - Abnormal; Notable for the following components:      Result Value   Influenza A by PCR POSITIVE (*)    All other components within normal limits  COMPREHENSIVE METABOLIC PANEL - Abnormal; Notable for the following components:   Sodium 133 (*)    CO2 19 (*)    Glucose, Bld 103 (*)    Creatinine, Ser 1.52 (*)    GFR, Estimated 36 (*)    All other components within normal limits  CBC WITH DIFFERENTIAL/PLATELET - Abnormal; Notable for the following components:   RBC 5.74 (*)    MCV 76.8 (*)    MCH 25.4 (*)    RDW 15.9 (*)    All other components within normal limits  CULTURE, BLOOD (ROUTINE X 2)  CULTURE, BLOOD (ROUTINE X 2)  LACTIC ACID, PLASMA  PROTIME-INR    EKG EKG Interpretation  Date/Time:  Saturday September 03 2021 20:35:32 EST Ventricular Rate:  116 PR Interval:  132 QRS Duration: 82 QT Interval:  324 QTC Calculation: 450 R Axis:   29 Text Interpretation: Sinus tachycardia Nonspecific ST and T wave abnormality Abnormal ECG Confirmed by Norman Clay ([id number]) on 09/03/2021 9:40:36 PM  Radiology DG Chest 2 View  Result Date: 09/03/2021 CLINICAL DATA:  Suspected sepsis. EXAM: CHEST - 2 VIEW COMPARISON:  Chest x-ray 07/17/2019. FINDINGS: The heart size and mediastinal contours are within normal limits. Both lungs are clear. Postsurgical changes are seen in both  shoulders. No acute fractures are identified. IMPRESSION: No active cardiopulmonary disease. Electronically Signed   By: Darliss Cheney M.D.   On: 09/03/2021 20:59    Procedures Procedures   Medications Ordered in ED Medications  acetaminophen (TYLENOL) tablet 650 mg (650 mg Oral Given 09/03/21 2040)    ED Course  I have reviewed the triage vital signs and the nursing notes.  Pertinent labs & imaging results that were available during my care of the patient were reviewed by me and considered in my medical decision making (see chart for details).    MDM Rules/Calculators/A&P                           EKG shows sinus rhythm.  No ST elevations depressions.  No T wave inversions noted.  Chest x-ray is unremarkable no infiltrate effusion or edema noted.  Labs show normal troponin normal hemoglobin.  Viral panel is positive for influenza A.  I suspect influenza as a cause of most of her symptoms here today.  Patient states her symptoms were actually going on for greater than 2 days.  She is not a candidate for Tamiflu.  Will advise outpatient follow-up with her doctor in 2 to 3 days.  Advised immediate return for worsening symptoms or any additional concerns.  Final Clinical Impression(s) / ED Diagnoses Final diagnoses:  Influenza A    Rx / DC Orders ED Discharge Orders     None        Cheryll Cockayne, MD 09/03/21 2234

## 2021-09-03 NOTE — Discharge Instructions (Addendum)
Call your primary care doctor or specialist as discussed in the next 2-3 days.   Return immediately back to the ER if:  Your symptoms worsen within the next 12-24 hours. You develop new symptoms such as new fevers, persistent vomiting, new pain, shortness of breath, or new weakness or numbness, or if you have any other concerns.  

## 2021-09-08 LAB — CULTURE, BLOOD (ROUTINE X 2)
Culture: NO GROWTH
Culture: NO GROWTH
Special Requests: ADEQUATE

## 2021-12-03 ENCOUNTER — Other Ambulatory Visit: Payer: Self-pay | Admitting: Neurology

## 2021-12-05 ENCOUNTER — Other Ambulatory Visit: Payer: Self-pay

## 2021-12-05 MED ORDER — VENLAFAXINE HCL ER 37.5 MG PO CP24: 75.0000 mg | ORAL_CAPSULE | Freq: Every day | ORAL | 0 refills | Status: AC

## 2021-12-25 ENCOUNTER — Other Ambulatory Visit: Payer: Self-pay | Admitting: Neurology

## 2022-01-18 ENCOUNTER — Other Ambulatory Visit: Payer: Self-pay | Admitting: Neurology

## 2022-03-16 ENCOUNTER — Other Ambulatory Visit (HOSPITAL_COMMUNITY): Payer: Self-pay | Admitting: Internal Medicine

## 2022-03-16 DIAGNOSIS — Z1231 Encounter for screening mammogram for malignant neoplasm of breast: Secondary | ICD-10-CM

## 2022-03-22 ENCOUNTER — Ambulatory Visit (HOSPITAL_COMMUNITY): Payer: Medicare Other

## 2022-05-22 ENCOUNTER — Telehealth: Payer: Self-pay | Admitting: Orthopedic Surgery

## 2022-05-22 ENCOUNTER — Ambulatory Visit (INDEPENDENT_AMBULATORY_CARE_PROVIDER_SITE_OTHER): Payer: Medicare Other | Admitting: Orthopedic Surgery

## 2022-05-22 ENCOUNTER — Encounter: Payer: Self-pay | Admitting: Orthopedic Surgery

## 2022-05-22 VITALS — BP 141/89 | HR 82 | Ht 66.0 in | Wt 183.0 lb

## 2022-05-22 DIAGNOSIS — M65321 Trigger finger, right index finger: Secondary | ICD-10-CM | POA: Diagnosis not present

## 2022-05-22 DIAGNOSIS — M65352 Trigger finger, left little finger: Secondary | ICD-10-CM

## 2022-05-22 DIAGNOSIS — M65342 Trigger finger, left ring finger: Secondary | ICD-10-CM

## 2022-05-22 MED ORDER — METHYLPREDNISOLONE ACETATE 40 MG/ML IJ SUSP
40.0000 mg | Freq: Once | INTRAMUSCULAR | Status: AC
  Administered 2022-05-22: 40 mg via INTRA_ARTICULAR

## 2022-05-22 MED ORDER — MELOXICAM 7.5 MG PO TABS: 7.5000 mg | ORAL_TABLET | Freq: Every day | ORAL | 0 refills | Status: DC

## 2022-05-22 NOTE — Telephone Encounter (Signed)
No further note

## 2022-05-22 NOTE — Patient Instructions (Signed)

## 2022-05-22 NOTE — Progress Notes (Signed)
FOLLOW UP   Encounter Diagnoses  Name Primary?   Trigger little finger of left hand Yes   Trigger ring finger of left hand    Trigger index finger of right hand      Chief Complaint  Patient presents with   Hand Pain    Left ring and small finger triggering   76 year old female with multiple trigger fingers.  She actually has tenosynovitis of the right index finger and left ring finger and actual triggering of the left small finger  No prior treatment history of prior trigger finger releases and other joints  BP (!) 141/89   Pulse 82   Ht 5\' 6"  (1.676 m)   Wt 183 lb (83 kg)   BMI 29.54 kg/m  General appearance normal color capillary refill temperature of the hands normal no evidence of lymphangitis skin is intact without rash sensation is intact she demonstrates triggering of the left small finger with tenderness over the A1 pulley of the left small left ring and right index  Injection was recommended for the left small finger and anti-inflammatories for the other digits  Trigger finger injection  Diagnosis trigger finger left small finger Procedure injection A1 pulley Medications lidocaine 1% 1 mL and Depo-Medrol 40 mg 1 mL Skin prep alcohol and ethyl chloride Verbal consent was obtained Timeout confirmed the injection site  After cleaning the skin with alcohol and anesthetizing the skin with ethyl chloride the A1 pulley was palpated and the injection was performed without complication    Meds ordered this encounter  Medications   meloxicam (MOBIC) 7.5 MG tablet    Sig: Take 1 tablet (7.5 mg total) by mouth daily for 21 days.    Dispense:  21 tablet    Refill:  0

## 2022-06-09 ENCOUNTER — Other Ambulatory Visit: Payer: Self-pay | Admitting: Orthopedic Surgery

## 2022-06-09 DIAGNOSIS — M65352 Trigger finger, left little finger: Secondary | ICD-10-CM

## 2022-06-09 DIAGNOSIS — M65321 Trigger finger, right index finger: Secondary | ICD-10-CM

## 2022-06-09 DIAGNOSIS — M65342 Trigger finger, left ring finger: Secondary | ICD-10-CM

## 2022-07-08 ENCOUNTER — Other Ambulatory Visit: Payer: Self-pay

## 2022-07-08 ENCOUNTER — Encounter (HOSPITAL_COMMUNITY): Payer: Self-pay | Admitting: Emergency Medicine

## 2022-07-08 ENCOUNTER — Inpatient Hospital Stay (HOSPITAL_COMMUNITY)
Admission: EM | Admit: 2022-07-08 | Discharge: 2022-07-10 | DRG: 872 | Disposition: A | Discharge status: Home / Self Care | Payer: Medicare Other | Attending: Family Medicine | Admitting: Family Medicine

## 2022-07-08 ENCOUNTER — Emergency Department (HOSPITAL_COMMUNITY): Payer: Medicare Other

## 2022-07-08 ENCOUNTER — Inpatient Hospital Stay (HOSPITAL_COMMUNITY): Payer: Medicare Other

## 2022-07-08 DIAGNOSIS — R6520 Severe sepsis without septic shock: Secondary | ICD-10-CM | POA: Diagnosis present

## 2022-07-08 DIAGNOSIS — R4182 Altered mental status, unspecified: Secondary | ICD-10-CM | POA: Diagnosis not present

## 2022-07-08 DIAGNOSIS — F0394 Unspecified dementia, unspecified severity, with anxiety: Secondary | ICD-10-CM | POA: Diagnosis not present

## 2022-07-08 DIAGNOSIS — G9340 Encephalopathy, unspecified: Secondary | ICD-10-CM | POA: Diagnosis not present

## 2022-07-08 DIAGNOSIS — Z8249 Family history of ischemic heart disease and other diseases of the circulatory system: Secondary | ICD-10-CM

## 2022-07-08 DIAGNOSIS — K219 Gastro-esophageal reflux disease without esophagitis: Secondary | ICD-10-CM | POA: Diagnosis present

## 2022-07-08 DIAGNOSIS — E785 Hyperlipidemia, unspecified: Secondary | ICD-10-CM | POA: Diagnosis present

## 2022-07-08 DIAGNOSIS — Z7984 Long term (current) use of oral hypoglycemic drugs: Secondary | ICD-10-CM | POA: Diagnosis not present

## 2022-07-08 DIAGNOSIS — N179 Acute kidney failure, unspecified: Secondary | ICD-10-CM | POA: Diagnosis present

## 2022-07-08 DIAGNOSIS — M1990 Unspecified osteoarthritis, unspecified site: Secondary | ICD-10-CM | POA: Diagnosis present

## 2022-07-08 DIAGNOSIS — I10 Essential (primary) hypertension: Secondary | ICD-10-CM | POA: Diagnosis present

## 2022-07-08 DIAGNOSIS — A4151 Sepsis due to Escherichia coli [E. coli]: Principal | ICD-10-CM | POA: Diagnosis present

## 2022-07-08 DIAGNOSIS — G9341 Metabolic encephalopathy: Secondary | ICD-10-CM | POA: Diagnosis not present

## 2022-07-08 DIAGNOSIS — Z28310 Unvaccinated for covid-19: Secondary | ICD-10-CM

## 2022-07-08 DIAGNOSIS — F0390 Unspecified dementia without behavioral disturbance: Secondary | ICD-10-CM | POA: Diagnosis present

## 2022-07-08 DIAGNOSIS — N390 Urinary tract infection, site not specified: Secondary | ICD-10-CM | POA: Diagnosis present

## 2022-07-08 DIAGNOSIS — Z79899 Other long term (current) drug therapy: Secondary | ICD-10-CM

## 2022-07-08 DIAGNOSIS — E119 Type 2 diabetes mellitus without complications: Secondary | ICD-10-CM | POA: Diagnosis present

## 2022-07-08 DIAGNOSIS — A419 Sepsis, unspecified organism: Secondary | ICD-10-CM | POA: Diagnosis not present

## 2022-07-08 DIAGNOSIS — E872 Acidosis, unspecified: Secondary | ICD-10-CM | POA: Diagnosis present

## 2022-07-08 DIAGNOSIS — N3000 Acute cystitis without hematuria: Principal | ICD-10-CM

## 2022-07-08 LAB — COMPREHENSIVE METABOLIC PANEL
ALT: 30 U/L (ref 0–44)
ALT: 40 U/L (ref 0–44)
AST: 38 U/L (ref 15–41)
AST: 53 U/L — ABNORMAL HIGH (ref 15–41)
Albumin: 4.2 g/dL (ref 3.5–5.0)
Albumin: 3.4 g/dL — ABNORMAL LOW (ref 3.5–5.0)
Alkaline Phosphatase: 61 U/L (ref 38–126)
Alkaline Phosphatase: 51 U/L (ref 38–126)
Anion gap: 13 (ref 5–15)
Anion gap: 8 (ref 5–15)
BUN: 21 mg/dL (ref 8–23)
BUN: 20 mg/dL (ref 8–23)
CO2: 22 mmol/L (ref 22–32)
CO2: 21 mmol/L — ABNORMAL LOW (ref 22–32)
Calcium: 9.7 mg/dL (ref 8.9–10.3)
Calcium: 8.6 mg/dL — ABNORMAL LOW (ref 8.9–10.3)
Chloride: 101 mmol/L (ref 98–111)
Chloride: 110 mmol/L (ref 98–111)
Creatinine, Ser: 1.8 mg/dL — ABNORMAL HIGH (ref 0.44–1.00)
Creatinine, Ser: 1.7 mg/dL — ABNORMAL HIGH (ref 0.44–1.00)
GFR, Estimated: 29 mL/min — ABNORMAL LOW (ref >60)
GFR, Estimated: 31 mL/min — ABNORMAL LOW (ref >60)
Glucose, Bld: 174 mg/dL — ABNORMAL HIGH (ref 70–99)
Glucose, Bld: 116 mg/dL — ABNORMAL HIGH (ref 70–99)
Potassium: 4.2 mmol/L (ref 3.5–5.1)
Potassium: 3.9 mmol/L (ref 3.5–5.1)
Sodium: 136 mmol/L (ref 135–145)
Sodium: 139 mmol/L (ref 135–145)
Total Bilirubin: 0.9 mg/dL (ref 0.3–1.2)
Total Bilirubin: 0.7 mg/dL (ref 0.3–1.2)
Total Protein: 8.1 g/dL (ref 6.5–8.1)
Total Protein: 6.6 g/dL (ref 6.5–8.1)

## 2022-07-08 LAB — CBC WITH DIFFERENTIAL/PLATELET
Abs Immature Granulocytes: 0.13 K/uL — ABNORMAL HIGH (ref 0.00–0.07)
Basophils Absolute: 0 K/uL (ref 0.0–0.1)
Basophils Relative: 0 %
Eosinophils Absolute: 0 K/uL (ref 0.0–0.5)
Eosinophils Relative: 0 %
HCT: 38.7 % (ref 36.0–46.0)
Hemoglobin: 13 g/dL (ref 12.0–15.0)
Immature Granulocytes: 1 %
Lymphocytes Relative: 11 %
Lymphs Abs: 1.4 K/uL (ref 0.7–4.0)
MCH: 25.9 pg — ABNORMAL LOW (ref 26.0–34.0)
MCHC: 33.6 g/dL (ref 30.0–36.0)
MCV: 77.1 fL — ABNORMAL LOW (ref 80.0–100.0)
Monocytes Absolute: 1.6 K/uL — ABNORMAL HIGH (ref 0.1–1.0)
Monocytes Relative: 13 %
Neutro Abs: 9.5 K/uL — ABNORMAL HIGH (ref 1.7–7.7)
Neutrophils Relative %: 75 %
Platelets: 263 K/uL (ref 150–400)
RBC: 5.02 MIL/uL (ref 3.87–5.11)
RDW: 15.9 % — ABNORMAL HIGH (ref 11.5–15.5)
WBC: 12.7 K/uL — ABNORMAL HIGH (ref 4.0–10.5)
nRBC: 0 % (ref 0.0–0.2)

## 2022-07-08 LAB — URINALYSIS, ROUTINE W REFLEX MICROSCOPIC
Bilirubin Urine: NEGATIVE
Glucose, UA: >=500 mg/dL — AB
Ketones, ur: NEGATIVE mg/dL
Nitrite: NEGATIVE
Protein, ur: NEGATIVE mg/dL
Specific Gravity, Urine: 1.006 (ref 1.005–1.030)
WBC, UA: >50 WBC/hpf — ABNORMAL HIGH (ref 0–5)
pH: 6 (ref 5.0–8.0)

## 2022-07-08 LAB — LACTIC ACID, PLASMA
Lactic Acid, Venous: 2.7 mmol/L (ref 0.5–1.9)
Lactic Acid, Venous: 1.1 mmol/L (ref 0.5–1.9)

## 2022-07-08 LAB — CBC
HCT: 43.3 % (ref 36.0–46.0)
Hemoglobin: 14.4 g/dL (ref 12.0–15.0)
MCH: 25.7 pg — ABNORMAL LOW (ref 26.0–34.0)
MCHC: 33.3 g/dL (ref 30.0–36.0)
MCV: 77.3 fL — ABNORMAL LOW (ref 80.0–100.0)
Platelets: 290 10*3/uL (ref 150–400)
RBC: 5.6 MIL/uL — ABNORMAL HIGH (ref 3.87–5.11)
RDW: 16.2 % — ABNORMAL HIGH (ref 11.5–15.5)
WBC: 11.3 10*3/uL — ABNORMAL HIGH (ref 4.0–10.5)
nRBC: 0 % (ref 0.0–0.2)

## 2022-07-08 LAB — PROTIME-INR
INR: 1.2 (ref 0.8–1.2)
Prothrombin Time: 14.6 s (ref 11.4–15.2)

## 2022-07-08 LAB — CBG MONITORING, ED: Glucose-Capillary: 196 mg/dL — ABNORMAL HIGH (ref 70–99)

## 2022-07-08 LAB — MAGNESIUM: Magnesium: 2.3 mg/dL (ref 1.7–2.4)

## 2022-07-08 LAB — PROCALCITONIN: Procalcitonin: 3.42 ng/mL

## 2022-07-08 LAB — CORTISOL-AM, BLOOD: Cortisol - AM: 19.7 ug/dL (ref 6.7–22.6)

## 2022-07-08 MED ORDER — SODIUM CHLORIDE 0.9 % IV SOLN
1.0000 g | Freq: Once | INTRAVENOUS | Status: AC
  Administered 2022-07-08: 1 g via INTRAVENOUS
  Filled 2022-07-08: qty 10

## 2022-07-08 MED ORDER — ZONISAMIDE 25 MG PO CAPS
25.0000 mg | ORAL_CAPSULE | Freq: Every day | ORAL | Status: DC
  Administered 2022-07-08 – 2022-07-10 (×3): 25 mg via ORAL
  Filled 2022-07-08 (×5): qty 1

## 2022-07-08 MED ORDER — SODIUM CHLORIDE 0.9 % IV BOLUS
1000.0000 mL | Freq: Once | INTRAVENOUS | Status: AC
  Administered 2022-07-08: 1000 mL via INTRAVENOUS

## 2022-07-08 MED ORDER — PRAVASTATIN SODIUM 40 MG PO TABS
40.0000 mg | ORAL_TABLET | Freq: Every day | ORAL | Status: DC
  Administered 2022-07-08 – 2022-07-10 (×3): 40 mg via ORAL
  Filled 2022-07-08 (×3): qty 1

## 2022-07-08 MED ORDER — AMLODIPINE BESYLATE 5 MG PO TABS
5.0000 mg | ORAL_TABLET | Freq: Every day | ORAL | Status: DC
  Administered 2022-07-08 – 2022-07-10 (×3): 5 mg via ORAL
  Filled 2022-07-08 (×3): qty 1

## 2022-07-08 MED ORDER — OXYCODONE HCL 5 MG PO TABS: 5.0000 mg | ORAL_TABLET | ORAL | Status: DC | PRN

## 2022-07-08 MED ORDER — ONDANSETRON HCL 4 MG/2ML IJ SOLN: 4.0000 mg | Freq: Four times a day (QID) | INTRAMUSCULAR | Status: DC | PRN

## 2022-07-08 MED ORDER — FAMOTIDINE 20 MG PO TABS
20.0000 mg | ORAL_TABLET | Freq: Two times a day (BID) | ORAL | Status: DC
  Administered 2022-07-08 – 2022-07-10 (×5): 20 mg via ORAL
  Filled 2022-07-08 (×5): qty 1

## 2022-07-08 MED ORDER — SODIUM CHLORIDE 0.9 % IV SOLN: INTRAVENOUS | Status: DC

## 2022-07-08 MED ORDER — VENLAFAXINE HCL ER 75 MG PO CP24
75.0000 mg | ORAL_CAPSULE | Freq: Every day | ORAL | Status: DC
  Administered 2022-07-08 – 2022-07-10 (×3): 75 mg via ORAL
  Filled 2022-07-08 (×3): qty 1

## 2022-07-08 MED ORDER — SODIUM CHLORIDE 0.9 % IV SOLN
2.0000 g | INTRAVENOUS | Status: DC
  Administered 2022-07-09 – 2022-07-10 (×2): 2 g via INTRAVENOUS
  Filled 2022-07-08 (×2): qty 20

## 2022-07-08 MED ORDER — ONDANSETRON HCL 4 MG PO TABS: 4.0000 mg | ORAL_TABLET | Freq: Four times a day (QID) | ORAL | Status: DC | PRN

## 2022-07-08 MED ORDER — HEPARIN SODIUM (PORCINE) 5000 UNIT/ML IJ SOLN
5000.0000 [IU] | Freq: Three times a day (TID) | INTRAMUSCULAR | Status: DC
  Administered 2022-07-08 – 2022-07-10 (×6): 5000 [IU] via SUBCUTANEOUS
  Filled 2022-07-08 (×6): qty 1

## 2022-07-08 MED ORDER — DONEPEZIL HCL 5 MG PO TABS
5.0000 mg | ORAL_TABLET | Freq: Every day | ORAL | Status: DC
  Administered 2022-07-08 – 2022-07-10 (×3): 5 mg via ORAL
  Filled 2022-07-08 (×3): qty 1

## 2022-07-08 MED ORDER — ACETAMINOPHEN 325 MG PO TABS
650.0000 mg | ORAL_TABLET | Freq: Once | ORAL | Status: AC
  Administered 2022-07-08: 650 mg via ORAL
  Filled 2022-07-08: qty 2

## 2022-07-08 MED ORDER — ACETAMINOPHEN 650 MG RE SUPP: 650.0000 mg | Freq: Four times a day (QID) | RECTAL | Status: DC | PRN

## 2022-07-08 MED ORDER — ACETAMINOPHEN 325 MG PO TABS: 650.0000 mg | ORAL_TABLET | Freq: Four times a day (QID) | ORAL | Status: DC | PRN

## 2022-07-08 NOTE — ED Notes (Signed)
Patient transported to CT 

## 2022-07-08 NOTE — ED Triage Notes (Addendum)
Pt with c/o dizziness "all day yesterday". Son states he checked pt's blood sugar and it was "low, low". Pt's blood sugar in triage was 196. Son states pt is "not acting like her self". Pt also endorses frequent urination.

## 2022-07-08 NOTE — Assessment & Plan Note (Signed)
-   Continue Aricept - Continue to monitor reorient as needed

## 2022-07-08 NOTE — Assessment & Plan Note (Signed)
-   Temp 103, heart rate 109, respiratory rate 29, with AKI, and acute metabolic encephalopathy as well as lactic acidosis - resolved now

## 2022-07-08 NOTE — Assessment & Plan Note (Signed)
-   Creatinine improved with hydration

## 2022-07-08 NOTE — ED Notes (Signed)
Called phlebotomist to inform pt needs blood draw, IV is currently running fluids and meds.

## 2022-07-08 NOTE — ED Notes (Signed)
Blood cultures and lactic acid being drawn now

## 2022-07-08 NOTE — Assessment & Plan Note (Signed)
-   Continue Pepcid -Continue to monitor 

## 2022-07-08 NOTE — ED Notes (Addendum)
Pt son brings pt in says she "wasn't acting like herself earlier," although is unable to elaborate on this, and states,  "sugar was low low low", when asked the numeric value- pt reports 98- informed pt this was a normal blood sugar level. Pt also tells this nurse that she has been "peeing a lot today and have been dizzy," pt also has some abd tenderness with palpation. Pt alert and oriented x 4 at this time, able to state name, birthday, where she is right now and correct year.

## 2022-07-08 NOTE — Assessment & Plan Note (Signed)
-   Described as confusion - No family at bedside, patient does not report that she was confused and dizzy - She remains subtly confused with fine details of her history, but she reports that she feels much better - CT head shows no acute abnormality - Most likely etiology is UTI - See treatment plan for UTI - Continue to monitor

## 2022-07-08 NOTE — H&P (Signed)
History and Physical    Patient: Beth Meza:[id number] DOB: April 14, 1946 DOA: 07/08/2022 DOS: the patient was seen and examined on 07/08/2022 PCP: Adaline Sill, NP  Patient coming from: Home  Chief Complaint:  Chief Complaint  Patient presents with   Altered Mental Status   HPI: Beth Meza is a 76 y.o. female with medical history significant of anemia, dementia, vertigo, hyperlipidemia, hypertension, and more presents the ED with a chief complaint of dizziness.  Patient reports that she was feeling dizzy and confused.  She has a history of vertigo, and reports it was not she experienced it was about a year ago.  She had a prescription for Antivert at that time, but she has not been taking it recently.  The dizziness is the patient's primary concern, and it is resolved now.  Patient reports she feels much better.  Family reported that she was confused.  There is no family at bedside.  Patient does remember being confused, and still when she is trying to review her history she gets caught up on fine details.  This could be her baseline with history of dementia.  Patient reports that she felt clammy at home.  She did not take any medication for fever.  She reports she did dysuria that started yesterday and she had an episode of hematuria several days ago.  She has had urgency and frequency.  She has had worsening incontinence over the last few days.  Patient reports she drinks plenty of water at home, drinking 2-3 bottles at night and 2-3 bottles during the day.  Patient reports that she has associated pelvic pain and pressure.  Patient reports she feels much better now and has no other complaints at this time.  Patient does not smoke, does not drink.  She is not vaccinated for COVID.  Patient is full code. Review of Systems: As mentioned in the history of present illness. All other systems reviewed and are negative. Past Medical History:  Diagnosis Date   Anemia    Back spasm     Degenerative arthritis    Diabetes ([place])    diet controlled   Dizziness and giddiness 03/07/2013   Dyslipidemia    Gastroesophageal reflux disease    Hand cramps    Headache syndrome 11/07/2018   Headache(784.0) 03/07/2013   History of carpal tunnel syndrome    Bilateral   HOH (hard of hearing)    Hypertension    Leg cramps    Memory deficit 02/25/2014   Seizures ([place])    as child- unknown etiology-no meds   Past Surgical History:  Procedure Laterality Date   ANKLE SURGERY Left    History of ankle fracture   BACK SURGERY     COLONOSCOPY N/A 05/04/2014   Procedure: COLONOSCOPY;  Surgeon: Danie Binder, MD;  Location: [facility];  Service: Endoscopy;  Laterality: N/A;  11:30   HAND SURGERY     Carpal tunnel syndrome, trigger fingers bilaterally   KNEE SURGERY Left    Pin, history of fracture   LUMBAR LAMINECTOMY/DECOMPRESSION MICRODISCECTOMY N/A 10/29/2017   Procedure: LUMBAR LAMINECTOMY AND FORAMINOTOMY LUMBAR THREE- LUMBAR FOUR, LUMBAR FOUR- LUMBAR FIVE;  Surgeon: Newman Pies, MD;  Location: [place];  Service: Neurosurgery;  Laterality: N/A;   plate in let leg     SHOULDER ACROMIOPLASTY Left 06/10/2014   Procedure: SHOULDER ACROMIOPLASTY;  Surgeon: Carole Civil, MD;  Location: [facility];  Service: Orthopedics;  Laterality: Left;   SHOULDER OPEN ROTATOR CUFF  REPAIR Left 04/08/2014   Dr. Aline Brochure   SHOULDER OPEN ROTATOR CUFF REPAIR Left 06/10/2014   Procedure: REVISION ROTATOR CUFF REPAIR SHOULDER OPEN WITH GRAFT;  Surgeon: Carole Civil, MD;  Location: [facility];  Service: Orthopedics;  Laterality: Left;   SHOULDER OPEN ROTATOR CUFF REPAIR Right 10/29/2018   Procedure: OPEN ROTATOR CUFF REPAIR WITH ACHROMIOPLASTY;  Surgeon: Carole Civil, MD;  Location: [facility];  Service: Orthopedics;  Laterality: Right;   TUBAL LIGATION     Social History:  reports that she has never smoked. She has never used smokeless tobacco. She reports that she does not drink alcohol and does not  use drugs.  No Known Allergies  Family History  Problem Relation Age of Onset   Heart attack Father    Colon cancer Neg Hx     Prior to Admission medications   Medication Sig Start Date End Date Taking? Authorizing Provider  acetaminophen (TYLENOL) 650 MG CR tablet Take 650 mg by mouth every 8 (eight) hours as needed for pain (takes 2 tablets in the morning and one tablet at night).    [provider]  amLODipine (NORVASC) 5 MG tablet Take 1 tablet by mouth daily. 07/10/19   [provider]  donepezil (ARICEPT) 5 MG tablet Take 1 tablet by mouth daily. 07/10/19   [provider]  famotidine (PEPCID) 20 MG tablet Take 20 mg by mouth 2 (two) times daily. 01/31/19   [provider]  FARXIGA 10 MG TABS tablet Take 10 mg by mouth daily. 08/30/20   [provider]  Ferrous Gluconate (IRON 27 PO) Take 27 mg by mouth 2 (two) times daily.    [provider]  lisinopril-hydrochlorothiazide (PRINZIDE,ZESTORETIC) 20-12.5 MG tablet Take 1 tablet by mouth daily.    [provider]  Multiple Vitamins-Minerals (SENIOR MULTIVITAMIN PLUS PO) Take 1 tablet by mouth daily.    [provider]  Omega-3 Fatty Acids (FISH OIL) 1000 MG CAPS Take 1,000 mg by mouth daily.     [provider]  pravastatin (PRAVACHOL) 40 MG tablet Take 40 mg by mouth daily. 02/11/14   [provider]  venlafaxine XR (EFFEXOR-XR) 37.5 MG 24 hr capsule Take 2 capsules (75 mg total) by mouth daily with breakfast. 12/05/21   Suzzanne Cloud, NP  vitamin C (ASCORBIC ACID) 500 MG tablet Take 500 mg by mouth daily.    [provider]  zonisamide (ZONEGRAN) 25 MG capsule Take 1 capsule (25 mg total) by mouth daily. 09/22/20   Suzzanne Cloud, NP  zonisamide (ZONEGRAN) 25 MG capsule 1 capsule Orally once a day for 90 days    [provider]    Physical Exam: Vitals:   07/08/22 0335 07/08/22 0406 07/08/22 0430 07/08/22 0458  BP: (!) 148/56  127/62  (!) 105/54  Pulse: 100 95  92  Resp: 19 18  18   Temp:  99.4 F (37.4 C)  98.9 F (37.2 C)  TempSrc:  Oral  Oral  SpO2: 100% 96%  100%  Weight:   83.8 kg   Height:   5\' 6"  (1.676 m)    1.  General: Patient lying supine in bed,  no acute distress   2. Psychiatric: Alert and oriented x person and place, mood and behavior normal for situation, pleasant and cooperative with exam   3. Neurologic: Speech and language are normal, face is symmetric, moves all 4 extremities voluntarily, at baseline without acute deficits on limited exam   4. HEENMT:  Head is atraumatic, normocephalic, pupils reactive to light, neck is supple, trachea is midline, mucous membranes are moist   5. Respiratory : Lungs are clear to auscultation bilaterally without wheezing, rhonchi, rales, no cyanosis, no increase in work of breathing or accessory muscle use   6. Cardiovascular : Heart rate normal, rhythm is regular, no murmurs, rubs or gallops, trace peripheral edema, peripheral pulses palpated   7. Gastrointestinal:  Abdomen is soft, nondistended, nontender to palpation bowel sounds active, no masses or organomegaly palpated   8. Skin:  Skin is warm, dry and intact without rashes, acute lesions, or ulcers on limited exam   9.Musculoskeletal:  No acute deformities or trauma, no asymmetry in tone, trace peripheral edema, peripheral pulses palpated, no tenderness to palpation in the extremities  Data Reviewed: In the ED 8.6-100, heart rate 89-109, respiratory rate 22-29, blood pressure 139/70, satting at 96% Leukocytosis 11.3, hemoglobin 14.4, platelets 290 Chemistry reveals an elevated creatinine at 1.80 UA is indicative of UTI CT head shows no acute abnormality EKG shows sinus tachycardia, QTc 422 with a heart rate of 114 patient was given Tylenol, Rocephin, 1 L bolus in the ED Urine culture pending Sepsis work-up was started at admission and revealed a lactic acid of 2.7 and blood cultures  were drawn Urine culture is also pending Admission was requested for GI with altered mental status  Assessment and Plan: * Acute metabolic encephalopathy - Described as confusion - No family at bedside, patient does not report that she was confused and dizzy - She remains subtly confused with fine details of her history, but she reports that she feels much better - CT head shows no acute abnormality - Most likely etiology is UTI - See treatment plan for UTI - Continue to monitor  GERD (gastroesophageal reflux disease) - Continue Pepcid - Continue to monitor  Dementia ([place]) - Continue Aricept - Continue to monitor reorient as needed  HLD (hyperlipidemia) - Continue statin  AKI (acute kidney injury) ([place]) - Creatinine currently 1.5, today 1.8 - Hold lisinopril and hydrochlorothiazide - Continue IV fluids  Sepsis ([place]) - Temp 103, heart rate 109, respiratory rate 29, with AKI, and acute metabolic encephalopathy as well as lactic acidosis - Likely etiology for UTI - Urine culture pending - No previous urine culture to compare in our system - Rocephin started in the ED - Continue Rocephin - Procalcitonin in the a.m. - Continue IV fluids - Continue to monitor      Advance Care Planning:   Code Status: Full Code   Consults: None  Family Communication: No family at bedside  Severity of Illness: The appropriate patient status for this patient is INPATIENT. Inpatient status is judged to be reasonable and necessary in order to provide the required intensity of service to ensure the patient's safety. The patient's presenting symptoms, physical exam findings, and initial radiographic and laboratory data in the context of their chronic comorbidities is felt to place them at high risk for further clinical deterioration. Furthermore, it is not anticipated that the patient will be medically stable for discharge from the hospital within 2 midnights of admission.   * I certify that  at the point of admission it is my clinical judgment that the patient will require inpatient hospital care spanning beyond 2 midnights from the point of admission due to high intensity of service, high risk for further deterioration and high frequency of surveillance required.*  Author: Rolla Plate, DO 07/08/2022 5:46 AM  For on call review  [email or web address]

## 2022-07-08 NOTE — ED Provider Notes (Signed)
[facility] EMERGENCY DEPARTMENT  Provider Note  CSN: [id number] Arrival date & time: 07/08/22 0049  History Chief Complaint  Patient presents with   Altered Mental Status    Beth Meza is a 76 y.o. female brought to the ED by son for evaluation of AMS. Son at bedside reports he spoke with the patient on the phone around 7pm and she seemed normal. His brother (who lives with patient) called him around 11pm to say she was acting strangely. They had a neighbor who is a doctor come check on her and they were told her sugar was low, although he isn't sure how low it was. They gave her something to eat and a recheck of the sugar was 98 but she did not improve. She has not had any facial droop or slurred speech. She may have had some dizziness although she doesn't complain of that to me. She reports some burning and urinary frequency. No fever or vomiting.    Home Medications Prior to Admission medications   Medication Sig Start Date End Date Taking? Authorizing Provider  acetaminophen (TYLENOL) 650 MG CR tablet Take 650 mg by mouth every 8 (eight) hours as needed for pain (takes 2 tablets in the morning and one tablet at night).    [provider]  amLODipine (NORVASC) 5 MG tablet Take 1 tablet by mouth daily. 07/10/19   [provider]  donepezil (ARICEPT) 5 MG tablet Take 1 tablet by mouth daily. 07/10/19   [provider]  famotidine (PEPCID) 20 MG tablet Take 20 mg by mouth 2 (two) times daily. 01/31/19   [provider]  FARXIGA 10 MG TABS tablet Take 10 mg by mouth daily. 08/30/20   [provider]  Ferrous Gluconate (IRON 27 PO) Take 27 mg by mouth 2 (two) times daily.    [provider]  lisinopril-hydrochlorothiazide (PRINZIDE,ZESTORETIC) 20-12.5 MG tablet Take 1 tablet by mouth daily.    [provider]  Multiple Vitamins-Minerals (SENIOR MULTIVITAMIN PLUS PO) Take 1 tablet by mouth daily.    [provider]   Omega-3 Fatty Acids (FISH OIL) 1000 MG CAPS Take 1,000 mg by mouth daily.     [provider]  pravastatin (PRAVACHOL) 40 MG tablet Take 40 mg by mouth daily. 02/11/14   [provider]  venlafaxine XR (EFFEXOR-XR) 37.5 MG 24 hr capsule Take 2 capsules (75 mg total) by mouth daily with breakfast. 12/05/21   Glean Salvo, NP  vitamin C (ASCORBIC ACID) 500 MG tablet Take 500 mg by mouth daily.    [provider]  zonisamide (ZONEGRAN) 25 MG capsule Take 1 capsule (25 mg total) by mouth daily. 09/22/20   Glean Salvo, NP  zonisamide (ZONEGRAN) 25 MG capsule 1 capsule Orally once a day for 90 days    [provider]     Allergies    Patient has no known allergies.   Review of Systems   Review of Systems Please see HPI for pertinent positives and negatives  Physical Exam BP 139/70   Pulse (!) 109   Temp (!) 103 F (39.4 C) (Oral)   Resp (!) 29   Ht 5\' 6"  (1.676 m)   Wt 79.4 kg   SpO2 96%   BMI 28.25 kg/m   Physical Exam Vitals and nursing note reviewed.  Constitutional:      Appearance: Normal appearance.  HENT:     Head: Normocephalic and atraumatic.     Nose: Nose normal.  Mouth/Throat:     Mouth: Mucous membranes are moist.  Eyes:     Extraocular Movements: Extraocular movements intact.     Conjunctiva/sclera: Conjunctivae normal.  Cardiovascular:     Rate and Rhythm: Normal rate.  Pulmonary:     Effort: Pulmonary effort is normal.     Breath sounds: Normal breath sounds.  Abdominal:     General: Abdomen is flat.     Palpations: Abdomen is soft.     Tenderness: There is no abdominal tenderness.  Musculoskeletal:        General: No swelling. Normal range of motion.     Cervical back: Neck supple.  Skin:    General: Skin is warm and dry.  Neurological:     General: No focal deficit present.     Mental Status: She is alert. She is disoriented.     Cranial Nerves: No cranial nerve deficit.     Sensory: No sensory deficit.      Motor: No weakness.     Coordination: Coordination normal.  Psychiatric:        Mood and Affect: Mood normal.     ED Results / Procedures / Treatments   EKG EKG Interpretation  Date/Time:  Saturday July 08 2022 01:05:31 EDT Ventricular Rate:  114 PR Interval:  149 QRS Duration: 93 QT Interval:  306 QTC Calculation: 422 R Axis:   26 Text Interpretation: Sinus tachycardia Borderline T abnormalities, inferior leads No significant change since last tracing Confirmed by Calvert Cantor [phone]) on 07/08/2022 1:29:13 AM  Procedures Procedures  Medications Ordered in the ED Medications  acetaminophen (TYLENOL) tablet 650 mg (has no administration in time range)  sodium chloride 0.9 % bolus 1,000 mL (1,000 mLs Intravenous New Bag/Given 07/08/22 0232)  cefTRIAXone (ROCEPHIN) 1 g in sodium chloride 0.9 % 100 mL IVPB (1 g Intravenous New Bag/Given 07/08/22 0232)    Initial Impression and Plan  Patient here with AMS, she has clear speech but unable to remember where she is or what month it is. Her Neuro exam is non-focal doubt this is a stroke. Glucose is not low here. Suspect UTI or other metabolic cause. Will check labs, send for CT and monitor in the ED.   ED Course   Clinical Course as of 07/08/22 0336  Sat Jul 08, 2022  0152 CBC with mild leukocytosis. Otherwise unremarkable.  [CS]  0206 CMP with mild increase in Cr from prior baseline. Otherwise neg. Will give IVF.  [CS]  0209 UA with signs of infection. Will give Rocephin.  [CS]  0304 I personally viewed the images from radiology studies and agree with radiologist interpretation: CT is neg for acute process. Patient has now spiked a fever. Will give a dose of APAP. Plan admission for further management.   [CS]  [phone] Spoke with Dr. Clearence Ped, Hospitalist, who will evaluate for admission.  [CS]    Clinical Course User Index [CS] Truddie Hidden, MD     MDM Rules/Calculators/A&P Medical Decision Making Problems  Addressed: Acute cystitis without hematuria: acute illness or injury AKI (acute kidney injury) [facility]): acute illness or injury Metabolic encephalopathy: acute illness or injury  Amount and/or Complexity of Data Reviewed Labs: ordered. Decision-making details documented in ED Course. Radiology: ordered and independent interpretation performed. Decision-making details documented in ED Course. ECG/medicine tests: ordered and independent interpretation performed. Decision-making details documented in ED Course.  Risk OTC drugs. Prescription drug management. Decision regarding hospitalization.    Final Clinical Impression(s) / ED Diagnoses Final diagnoses:  Acute cystitis without hematuria  Metabolic encephalopathy  AKI (acute kidney injury) [facility])    Rx / DC Orders ED Discharge Orders     None        Pollyann Savoy, MD 07/08/22 [phone]

## 2022-07-08 NOTE — Progress Notes (Signed)
ASSUMPTION OF CARE NOTE   07/08/2022 12:50 PM  Beth Meza was seen and examined.  The H&P by the admitting provider, orders, imaging was reviewed.  Please see new orders.  Will continue to follow.   Vitals:   07/08/22 0949 07/08/22 1030  BP: 128/65 (!) 144/76  Pulse: 85   Resp: 20   Temp: 98.3 F (36.8 C)   SpO2: 99%     Results for orders placed or performed during the hospital encounter of 07/08/22  Culture, blood (Routine X 2) w Reflex to ID Panel   Specimen: Left Antecubital; Blood  Result Value Ref Range   Specimen Description      LEFT ANTECUBITAL BOTTLES DRAWN AEROBIC AND ANAEROBIC   Special Requests      Blood Culture adequate volume Performed at [facility], [address] [place], [place] [address]    Culture PENDING    Report Status PENDING   Culture, blood (Routine X 2) w Reflex to ID Panel   Specimen: BLOOD LEFT HAND  Result Value Ref Range   Specimen Description      BLOOD LEFT HAND BOTTLES DRAWN AEROBIC AND ANAEROBIC   Special Requests      Blood Culture adequate volume Performed at [facility], [address], [place],  [address]    Culture PENDING    Report Status PENDING   CBC  Result Value Ref Range   WBC 11.3 (H) 4.0 - 10.5 K/uL   RBC 5.60 (H) 3.87 - 5.11 MIL/uL   Hemoglobin 14.4 12.0 - 15.0 g/dL   HCT 43.3 36.0 - 46.0 %   MCV 77.3 (L) 80.0 - 100.0 fL   MCH 25.7 (L) 26.0 - 34.0 pg   MCHC 33.3 30.0 - 36.0 g/dL   RDW 16.2 (H) 11.5 - 15.5 %   Platelets 290 150 - 400 K/uL   nRBC 0.0 0.0 - 0.2 %  Comprehensive metabolic panel  Result Value Ref Range   Sodium 136 135 - 145 mmol/L   Potassium 4.2 3.5 - 5.1 mmol/L   Chloride 101 98 - 111 mmol/L   CO2 22 22 - 32 mmol/L   Glucose, Bld 174 (H) 70 - 99 mg/dL   BUN 21 8 - 23 mg/dL   Creatinine, Ser 1.80 (H) 0.44 - 1.00 mg/dL   Calcium 9.7 8.9 - 10.3 mg/dL   Total Protein 8.1 6.5 - 8.1 g/dL   Albumin 4.2 3.5 - 5.0 g/dL   AST 38 15 - 41 U/L   ALT 30 0 - 44 U/L   Alkaline Phosphatase  61 38 - 126 U/L   Total Bilirubin 0.9 0.3 - 1.2 mg/dL   GFR, Estimated 29 (L) >60 mL/min   Anion gap 13 5 - 15  Urinalysis, Routine w reflex microscopic Urine, Clean Catch  Result Value Ref Range   Color, Urine STRAW (A) YELLOW   APPearance HAZY (A) CLEAR   Specific Gravity, Urine 1.006 1.005 - 1.030   pH 6.0 5.0 - 8.0   Glucose, UA >=500 (A) NEGATIVE mg/dL   Hgb urine dipstick MODERATE (A) NEGATIVE   Bilirubin Urine NEGATIVE NEGATIVE   Ketones, ur NEGATIVE NEGATIVE mg/dL   Protein, ur NEGATIVE NEGATIVE mg/dL   Nitrite NEGATIVE NEGATIVE   Leukocytes,Ua LARGE (A) NEGATIVE   RBC / HPF 0-5 0 - 5 RBC/hpf   WBC, UA >50 (H) 0 - 5 WBC/hpf   Bacteria, UA RARE (A) NONE SEEN   Mucus PRESENT    Budding Yeast PRESENT  Lactic acid, plasma  Result Value Ref Range   Lactic Acid, Venous 2.7 (HH) 0.5 - 1.9 mmol/L  Lactic acid, plasma  Result Value Ref Range   Lactic Acid, Venous 1.1 0.5 - 1.9 mmol/L  Protime-INR  Result Value Ref Range   Prothrombin Time 14.6 11.4 - 15.2 seconds   INR 1.2 0.8 - 1.2  Cortisol-am, blood  Result Value Ref Range   Cortisol - AM 19.7 6.7 - 22.6 ug/dL  Procalcitonin  Result Value Ref Range   Procalcitonin 3.42 ng/mL  Comprehensive metabolic panel  Result Value Ref Range   Sodium 139 135 - 145 mmol/L   Potassium 3.9 3.5 - 5.1 mmol/L   Chloride 110 98 - 111 mmol/L   CO2 21 (L) 22 - 32 mmol/L   Glucose, Bld 116 (H) 70 - 99 mg/dL   BUN 20 8 - 23 mg/dL   Creatinine, Ser 1.70 (H) 0.44 - 1.00 mg/dL   Calcium 8.6 (L) 8.9 - 10.3 mg/dL   Total Protein 6.6 6.5 - 8.1 g/dL   Albumin 3.4 (L) 3.5 - 5.0 g/dL   AST 53 (H) 15 - 41 U/L   ALT 40 0 - 44 U/L   Alkaline Phosphatase 51 38 - 126 U/L   Total Bilirubin 0.7 0.3 - 1.2 mg/dL   GFR, Estimated 31 (L) >60 mL/min   Anion gap 8 5 - 15  Magnesium  Result Value Ref Range   Magnesium 2.3 1.7 - 2.4 mg/dL  CBC with Differential/Platelet  Result Value Ref Range   WBC 12.7 (H) 4.0 - 10.5 K/uL   RBC 5.02 3.87 - 5.11  MIL/uL   Hemoglobin 13.0 12.0 - 15.0 g/dL   HCT 38.7 36.0 - 46.0 %   MCV 77.1 (L) 80.0 - 100.0 fL   MCH 25.9 (L) 26.0 - 34.0 pg   MCHC 33.6 30.0 - 36.0 g/dL   RDW 15.9 (H) 11.5 - 15.5 %   Platelets 263 150 - 400 K/uL   nRBC 0.0 0.0 - 0.2 %   Neutrophils Relative % 75 %   Neutro Abs 9.5 (H) 1.7 - 7.7 K/uL   Lymphocytes Relative 11 %   Lymphs Abs 1.4 0.7 - 4.0 K/uL   Monocytes Relative 13 %   Monocytes Absolute 1.6 (H) 0.1 - 1.0 K/uL   Eosinophils Relative 0 %   Eosinophils Absolute 0.0 0.0 - 0.5 K/uL   Basophils Relative 0 %   Basophils Absolute 0.0 0.0 - 0.1 K/uL   Immature Granulocytes 1 %   Abs Immature Granulocytes 0.13 (H) 0.00 - 0.07 K/uL  CBG monitoring, ED  Result Value Ref Range   Glucose-Capillary 196 (H) 70 - 99 mg/dL     Murvin Natal, MD [facility]   07/08/2022 12:50 AM How to contact the [facility] Attending or Consulting provider 7A - 7P or covering provider during after hours 7P -7A, for this patient?  Check the care team in [facility] and look for a) attending/consulting TRH provider listed and b) the [facility] team listed Log into [email or web address] and use [place]'s universal password to access. If you do not have the password, please contact the hospital operator. Locate the [facility] provider you are looking for under [facility] and page to a number that you can be directly reached. If you still have difficulty reaching the provider, please page the [facility] (Director on Call) for the Hospitalists listed on amion for assistance.

## 2022-07-08 NOTE — Assessment & Plan Note (Signed)
Continue statin. 

## 2022-07-09 DIAGNOSIS — G9341 Metabolic encephalopathy: Secondary | ICD-10-CM | POA: Diagnosis not present

## 2022-07-09 DIAGNOSIS — F0394 Unspecified dementia, unspecified severity, with anxiety: Secondary | ICD-10-CM | POA: Diagnosis not present

## 2022-07-09 DIAGNOSIS — E785 Hyperlipidemia, unspecified: Secondary | ICD-10-CM | POA: Diagnosis not present

## 2022-07-09 DIAGNOSIS — N179 Acute kidney failure, unspecified: Secondary | ICD-10-CM | POA: Diagnosis not present

## 2022-07-09 LAB — BASIC METABOLIC PANEL
Anion gap: 9 (ref 5–15)
BUN: 14 mg/dL (ref 8–23)
CO2: 20 mmol/L — ABNORMAL LOW (ref 22–32)
Calcium: 8.2 mg/dL — ABNORMAL LOW (ref 8.9–10.3)
Chloride: 108 mmol/L (ref 98–111)
Creatinine, Ser: 1.7 mg/dL — ABNORMAL HIGH (ref 0.44–1.00)
GFR, Estimated: 31 mL/min — ABNORMAL LOW (ref >60)
Glucose, Bld: 108 mg/dL — ABNORMAL HIGH (ref 70–99)
Potassium: 4.1 mmol/L (ref 3.5–5.1)
Sodium: 137 mmol/L (ref 135–145)

## 2022-07-09 LAB — CBC WITH DIFFERENTIAL/PLATELET
Abs Immature Granulocytes: 0.05 10*3/uL (ref 0.00–0.07)
Basophils Absolute: 0 10*3/uL (ref 0.0–0.1)
Basophils Relative: 0 %
Eosinophils Absolute: 0.1 10*3/uL (ref 0.0–0.5)
Eosinophils Relative: 1 %
HCT: 38.6 % (ref 36.0–46.0)
Hemoglobin: 12.8 g/dL (ref 12.0–15.0)
Immature Granulocytes: 0 %
Lymphocytes Relative: 13 %
Lymphs Abs: 1.5 10*3/uL (ref 0.7–4.0)
MCH: 25.6 pg — ABNORMAL LOW (ref 26.0–34.0)
MCHC: 33.2 g/dL (ref 30.0–36.0)
MCV: 77.2 fL — ABNORMAL LOW (ref 80.0–100.0)
Monocytes Absolute: 1.1 10*3/uL — ABNORMAL HIGH (ref 0.1–1.0)
Monocytes Relative: 9 %
Neutro Abs: 9.4 10*3/uL — ABNORMAL HIGH (ref 1.7–7.7)
Neutrophils Relative %: 77 %
Platelets: 227 10*3/uL (ref 150–400)
RBC: 5 MIL/uL (ref 3.87–5.11)
RDW: 16.1 % — ABNORMAL HIGH (ref 11.5–15.5)
WBC: 12.2 10*3/uL — ABNORMAL HIGH (ref 4.0–10.5)
nRBC: 0 % (ref 0.0–0.2)

## 2022-07-09 LAB — PROCALCITONIN: Procalcitonin: 5.29 ng/mL

## 2022-07-09 MED ORDER — DOXYCYCLINE HYCLATE 100 MG PO TABS
100.0000 mg | ORAL_TABLET | Freq: Two times a day (BID) | ORAL | Status: DC
  Administered 2022-07-09 – 2022-07-10 (×3): 100 mg via ORAL
  Filled 2022-07-09 (×3): qty 1

## 2022-07-09 NOTE — Progress Notes (Signed)
PROGRESS NOTE   Beth Meza  LKG:[id number] DOB: 1946-05-16 DOA: 07/08/2022 PCP: Rebekah Chesterfield, NP   Chief Complaint  Patient presents with   Altered Mental Status   Level of care: Telemetry  Brief Admission History:  76 y.o. female with medical history significant of anemia, dementia, vertigo, hyperlipidemia, hypertension, and more presents the ED with a chief complaint of dizziness.  Patient reports that she was feeling dizzy and confused.  She has a history of vertigo, and reports it was not she experienced it was about a year ago.  She had a prescription for Antivert at that time, but she has not been taking it recently.  The dizziness is the patient's primary concern, and it is resolved now.  Patient reports she feels much better.  Family reported that she was confused.  There is no family at bedside.  Patient does remember being confused, and still when she is trying to review her history she gets caught up on fine details.  This could be her baseline with history of dementia.  Patient reports that she felt clammy at home.  She did not take any medication for fever.  She reports she did dysuria that started yesterday and she had an episode of hematuria several days ago.  She has had urgency and frequency.  She has had worsening incontinence over the last few days.  Patient reports she drinks plenty of water at home, drinking 2-3 bottles at night and 2-3 bottles during the day.  Patient reports that she has associated pelvic pain and pressure.  Patient reports she feels much better now and has no other complaints at this time.   Assessment and Plan: * Acute metabolic encephalopathy - Described as confusion - No family at bedside, patient does not report that she was confused and dizzy - She remains subtly confused with fine details of her history, but she reports that she feels much better - CT head shows no acute abnormality - Most likely etiology is UTI - See treatment plan for  UTI - Continue to monitor  GERD (gastroesophageal reflux disease) - Continue Pepcid - Continue to monitor  Dementia (HCC) - Continue Aricept - Continue to monitor reorient as needed  HLD (hyperlipidemia) - Continue statin  AKI (acute kidney injury) (HCC) - Creatinine currently 1.5, today 1.8 - Hold lisinopril and hydrochlorothiazide - Continue IV fluids  Sepsis (HCC) - Temp 103, heart rate 109, respiratory rate 29, with AKI, and acute metabolic encephalopathy as well as lactic acidosis - Likely etiology for UTI - Urine culture pending - No previous urine culture to compare in our system - Rocephin started in the ED - Continue Rocephin - Procalcitonin in the a.m. - Continue IV fluids - Continue to monitor  DVT prophylaxis: [place] heparin Code Status: full  Family Communication:  Disposition: Status is: Inpatient Remains inpatient appropriate because: IV treatments   Consultants:   Procedures:   Antimicrobials:    Subjective: Pt having less symptoms today, no chills, no headache, no cough Objective: Vitals:   07/08/22 1456 07/08/22 1846 07/08/22 2113 07/09/22 0459  BP: (!) 115/57 127/61 (!) 142/67 129/66  Pulse: 96 84 87 88  Resp: 20 20 20 20   Temp: 99.6 F (37.6 C) 98.3 F (36.8 C) 98.8 F (37.1 C) 98.9 F (37.2 C)  TempSrc: Oral Oral Oral Oral  SpO2: 99% 100% 99% 98%  Weight:      Height:        Intake/Output Summary (Last 24 hours) at  07/09/2022 1324 Last data filed at 07/08/2022 2100 Gross per 24 hour  Intake 0.4 ml  Output 850 ml  Net -849.6 ml   Filed Weights   07/08/22 0058 07/08/22 0430  Weight: 79.4 kg 83.8 kg   Examination:  General exam: Appears calm and comfortable  Respiratory system: Clear to auscultation. Respiratory effort normal. Cardiovascular system: normal S1 & S2 heard. No JVD, murmurs, rubs, gallops or clicks. No pedal edema. Gastrointestinal system: Abdomen is nondistended, soft and nontender. No organomegaly or masses felt.  Normal bowel sounds heard. Central nervous system: Alert and oriented. No focal neurological deficits. Extremities: Symmetric 5 x 5 power. Skin: No rashes, lesions or ulcers. Psychiatry: Judgement and insight appear normal. Mood & affect appropriate.   Data Reviewed: I have personally reviewed following labs and imaging studies  CBC: Recent Labs  Lab 07/08/22 0125 07/08/22 0529 07/09/22 0549  WBC 11.3* 12.7* 12.2*  NEUTROABS  --  9.5* 9.4*  HGB 14.4 13.0 12.8  HCT 43.3 38.7 38.6  MCV 77.3* 77.1* 77.2*  PLT 290 263 227    Basic Metabolic Panel: Recent Labs  Lab 07/08/22 0125 07/08/22 0529 07/09/22 0549  NA 136 139 137  K 4.2 3.9 4.1  CL 101 110 108  CO2 22 21* 20*  GLUCOSE 174* 116* 108*  BUN 21 20 14   CREATININE 1.80* 1.70* 1.70*  CALCIUM 9.7 8.6* 8.2*  MG  --  2.3  --     CBG: Recent Labs  Lab 07/08/22 0054  GLUCAP 196*    Recent Results (from the past 240 hour(s))  Culture, blood (Routine X 2) w Reflex to ID Panel     Status: None (Preliminary result)   Collection Time: 07/08/22  4:00 AM   Specimen: Left Antecubital; Blood  Result Value Ref Range Status   Specimen Description   Final    LEFT ANTECUBITAL BOTTLES DRAWN AEROBIC AND ANAEROBIC   Special Requests Blood Culture adequate volume  Final   Culture   Final    NO GROWTH < 24 HOURS Performed at [facility], [address] [place], [place]    Report Status PENDING  Incomplete  Culture, blood (Routine X 2) w Reflex to ID Panel     Status: None (Preliminary result)   Collection Time: 07/08/22  4:09 AM   Specimen: BLOOD LEFT HAND  Result Value Ref Range Status   Specimen Description   Final    BLOOD LEFT HAND BOTTLES DRAWN AEROBIC AND ANAEROBIC   Special Requests Blood Culture adequate volume  Final   Culture   Final    NO GROWTH < 24 HOURS Performed at [facility], [address], [place], [place]    Report Status PENDING  Incomplete     Radiology Studies: DG CHEST PORT  1 VIEW  Result Date: 07/08/2022 CLINICAL DATA:  Pt with sepsis and fever possibly due to UTI, pt states she is feeling well today with no CP or SOB. 07/10/2022 [id number] EXAM: PORTABLE CHEST 1 VIEW COMPARISON:  None Available. FINDINGS: Normal mediastinum and cardiac silhouette. Normal pulmonary vasculature. No evidence of effusion, infiltrate, or pneumothorax. No acute bony abnormality. IMPRESSION: No acute cardiopulmonary process. Electronically Signed   By: [id number] M.D.   On: 07/08/2022 11:12   CT Head Wo Contrast  Result Date: 07/08/2022 CLINICAL DATA:  Altered mental status EXAM: CT HEAD WITHOUT CONTRAST TECHNIQUE: Contiguous axial images were obtained from the base of the skull through the vertex without intravenous contrast. RADIATION  DOSE REDUCTION: This exam was performed according to the departmental dose-optimization program which includes automated exposure control, adjustment of the mA and/or kV according to patient size and/or use of iterative reconstruction technique. COMPARISON:  08/16/2018 FINDINGS: Brain: No evidence of acute infarction, hemorrhage, hydrocephalus, extra-axial collection or mass lesion/mass effect. Chronic white matter ischemic changes are noted. Vascular: No hyperdense vessel or unexpected calcification. Skull: Normal. Negative for fracture or focal lesion. Sinuses/Orbits: No acute finding. Other: None. IMPRESSION: Chronic white matter ischemic changes stable from the prior study. No acute abnormality noted. Electronically Signed   By: Inez Catalina M.D.   On: 07/08/2022 03:00    Scheduled Meds:  amLODipine  5 mg Oral Daily   donepezil  5 mg Oral Daily   famotidine  20 mg Oral BID   heparin  5,000 Units Subcutaneous Q8H   pravastatin  40 mg Oral Daily   venlafaxine XR  75 mg Oral Q breakfast   zonisamide  25 mg Oral Daily   Continuous Infusions:  sodium chloride 50 mL/hr at 07/09/22 0850   cefTRIAXone (ROCEPHIN)  IV 2 g (07/09/22 0342)     LOS: 1 day    Time spent: 28 mins   Wynetta Emery, MD How to contact the [facility] Attending or Consulting provider [place] or covering provider during after hours [place], for this patient?  Check the care team in [facility] and look for a) attending/consulting TRH provider listed and b) the [facility] team listed Log into [email or web address] and use [place]'s universal password to access. If you do not have the password, please contact the hospital operator. Locate the [facility] provider you are looking for under [facility] and page to a number that you can be directly reached. If you still have difficulty reaching the provider, please page the [facility] (Director on Call) for the Hospitalists listed on amion for assistance.  07/09/2022, 1:24 PM

## 2022-07-09 NOTE — Hospital Course (Signed)
76 y.o. female with medical history significant of anemia, dementia, vertigo, hyperlipidemia, hypertension, and more presents the ED with a chief complaint of dizziness.  Patient reports that she was feeling dizzy and confused.  She has a history of vertigo, and reports it was not she experienced it was about a year ago.  She had a prescription for Antivert at that time, but she has not been taking it recently.  The dizziness is the patient's primary concern, and it is resolved now.  Patient reports she feels much better.  Family reported that she was confused.  There is no family at bedside.  Patient does remember being confused, and still when she is trying to review her history she gets caught up on fine details.  This could be her baseline with history of dementia.  Patient reports that she felt clammy at home.  She did not take any medication for fever.  She reports she did dysuria that started yesterday and she had an episode of hematuria several days ago.  She has had urgency and frequency.  She has had worsening incontinence over the last few days.  Patient reports she drinks plenty of water at home, drinking 2-3 bottles at night and 2-3 bottles during the day.  Patient reports that she has associated pelvic pain and pressure.  Patient reports she feels much better now and has no other complaints at this time.

## 2022-07-09 NOTE — TOC Progression Note (Signed)
  Transition of Care [facility]) Screening Note   Patient Details  Name: KADISHA GOODINE Date of Birth: December 28, 1945   Transition of Care [facility]) CM/SW Contact:    Shade Flood, LCSW Phone Number: 07/09/2022, 11:11 AM    Transition of Care Department [facility]) has reviewed patient and no TOC needs have been identified at this time. We will continue to monitor patient advancement through interdisciplinary progression rounds. If new patient transition needs arise, please place a TOC consult.

## 2022-07-10 DIAGNOSIS — F0394 Unspecified dementia, unspecified severity, with anxiety: Secondary | ICD-10-CM | POA: Diagnosis not present

## 2022-07-10 DIAGNOSIS — G9341 Metabolic encephalopathy: Secondary | ICD-10-CM | POA: Diagnosis not present

## 2022-07-10 DIAGNOSIS — K219 Gastro-esophageal reflux disease without esophagitis: Secondary | ICD-10-CM | POA: Diagnosis not present

## 2022-07-10 DIAGNOSIS — N179 Acute kidney failure, unspecified: Secondary | ICD-10-CM | POA: Diagnosis not present

## 2022-07-10 LAB — CBC WITH DIFFERENTIAL/PLATELET
Abs Immature Granulocytes: 0.04 10*3/uL (ref 0.00–0.07)
Basophils Absolute: 0 10*3/uL (ref 0.0–0.1)
Basophils Relative: 0 %
Eosinophils Absolute: 0.1 10*3/uL (ref 0.0–0.5)
Eosinophils Relative: 1 %
HCT: 38.7 % (ref 36.0–46.0)
Hemoglobin: 13.1 g/dL (ref 12.0–15.0)
Immature Granulocytes: 0 %
Lymphocytes Relative: 15 %
Lymphs Abs: 1.4 10*3/uL (ref 0.7–4.0)
MCH: 25.9 pg — ABNORMAL LOW (ref 26.0–34.0)
MCHC: 33.9 g/dL (ref 30.0–36.0)
MCV: 76.5 fL — ABNORMAL LOW (ref 80.0–100.0)
Monocytes Absolute: 0.7 10*3/uL (ref 0.1–1.0)
Monocytes Relative: 7 %
Neutro Abs: 7.5 10*3/uL (ref 1.7–7.7)
Neutrophils Relative %: 77 %
Platelets: 243 10*3/uL (ref 150–400)
RBC: 5.06 MIL/uL (ref 3.87–5.11)
RDW: 15.9 % — ABNORMAL HIGH (ref 11.5–15.5)
WBC: 9.8 10*3/uL (ref 4.0–10.5)
nRBC: 0 % (ref 0.0–0.2)

## 2022-07-10 LAB — PROCALCITONIN: Procalcitonin: 2.01 ng/mL

## 2022-07-10 MED ORDER — DOXYCYCLINE HYCLATE 100 MG PO TABS: 100.0000 mg | ORAL_TABLET | Freq: Two times a day (BID) | ORAL | 0 refills | Status: AC

## 2022-07-10 MED ORDER — SACCHAROMYCES BOULARDII 250 MG PO CAPS: 250.0000 mg | ORAL_CAPSULE | Freq: Two times a day (BID) | ORAL | 0 refills | Status: AC

## 2022-07-10 MED ORDER — CEFDINIR 300 MG PO CAPS: 600.0000 mg | ORAL_CAPSULE | Freq: Every day | ORAL | 0 refills | Status: AC

## 2022-07-10 NOTE — Care Management Important Message (Signed)
Important Message  Patient Details  Name: Beth Meza MRN: [id number] Date of Birth: April 10, 1946   Medicare Important Message Given:  Yes (reviewed letter verbally at [phone], no additional copy needed)     Tommy Medal 07/10/2022, 12:18 PM

## 2022-07-10 NOTE — Discharge Instructions (Signed)
IMPORTANT INFORMATION: PAY CLOSE ATTENTION  ° °PHYSICIAN DISCHARGE INSTRUCTIONS ° °Follow with Primary care provider  Dickey, Kirkland M, NP  and other consultants as instructed by your Hospitalist Physician ° °SEEK MEDICAL CARE OR RETURN TO EMERGENCY ROOM IF SYMPTOMS COME BACK, WORSEN OR NEW PROBLEM DEVELOPS  ° °Please note: °You were cared for by a hospitalist during your hospital stay. Every effort will be made to forward records to your primary care provider.  You can request that your primary care provider send for your hospital records if they have not received them.  Once you are discharged, your primary care physician will handle any further medical issues. Please note that NO REFILLS for any discharge medications will be authorized once you are discharged, as it is imperative that you return to your primary care physician (or establish a relationship with a primary care physician if you do not have one) for your post hospital discharge needs so that they can reassess your need for medications and monitor your lab values. ° °Please get a complete blood count and chemistry panel checked by your Primary MD at your next visit, and again as instructed by your Primary MD. ° °Get Medicines reviewed and adjusted: °Please take all your medications with you for your next visit with your Primary MD ° °Laboratory/radiological data: °Please request your Primary MD to go over all hospital tests and procedure/radiological results at the follow up, please ask your primary care provider to get all Hospital records sent to his/her office. ° °In some cases, they will be blood work, cultures and biopsy results pending at the time of your discharge. Please request that your primary care provider follow up on these results. ° °If you are diabetic, please bring your blood sugar readings with you to your follow up appointment with primary care.   ° °Please call and make your follow up appointments as soon as possible.   ° °Also  Note the following: °If you experience worsening of your admission symptoms, develop shortness of breath, life threatening emergency, suicidal or homicidal thoughts you must seek medical attention immediately by calling 911 or calling your MD immediately  if symptoms less severe. ° °You must read complete instructions/literature along with all the possible adverse reactions/side effects for all the Medicines you take and that have been prescribed to you. Take any new Medicines after you have completely understood and accpet all the possible adverse reactions/side effects.  ° °Do not drive when taking Pain medications or sleeping medications (Benzodiazepines) ° °Do not take more than prescribed Pain, Sleep and Anxiety Medications. It is not advisable to combine anxiety,sleep and pain medications without talking with your primary care practitioner ° °Special Instructions: If you have smoked or chewed Tobacco  in the last 2 yrs please stop smoking, stop any regular Alcohol  and or any Recreational drug use. ° °Wear Seat belts while driving.  Do not drive if taking any narcotic, mind altering or controlled substances or recreational drugs or alcohol.  ° ° ° ° ° °

## 2022-07-10 NOTE — Discharge Summary (Signed)
Physician Discharge Summary  Beth Meza EVO:[id number] DOB: 27-Feb-1946 DOA: 07/08/2022  PCP: Adaline Sill, NP  Admit date: 07/08/2022 Discharge date: 07/10/2022  Admitted From:  HOME  Disposition: HOME   Recommendations for Outpatient Follow-up:  Follow up with PCP in 1 weeks  Discharge Condition: STABLE   CODE STATUS: FULL DIET: [place]   Brief Hospitalization Summary: Please see all hospital notes, images, labs for full details of the hospitalization. 76 y.o. female with medical history significant of anemia, dementia, vertigo, hyperlipidemia, hypertension, and more presents the ED with a chief complaint of dizziness.  Patient reports that she was feeling dizzy and confused.  She has a history of vertigo, and reports it was not she experienced it was about a year ago.  She had a prescription for Antivert at that time, but she has not been taking it recently.  The dizziness is the patient's primary concern, and it is resolved now.  Patient reports she feels much better.  Family reported that she was confused.  There is no family at bedside.  Patient does remember being confused, and still when she is trying to review her history she gets caught up on fine details.  This could be her baseline with history of dementia.  Patient reports that she felt clammy at home.  She did not take any medication for fever.  She reports she did dysuria that started yesterday and she had an episode of hematuria several days ago.  She has had urgency and frequency.  She has had worsening incontinence over the last few days.  Patient reports she drinks plenty of water at home, drinking 2-3 bottles at night and 2-3 bottles during the day.  Patient reports that she has associated pelvic pain and pressure.  Patient reports she feels much better now and has no other complaints at this time.  HOSPITAL COURSE BY PROBLEM   Assessment and Plan: * Acute Confusion - Described as confusion but resolved  now   GERD (gastroesophageal reflux disease) - Continue Pepcid - Continue to monitor  Dementia (HCC) - Continue Aricept - Continue to monitor reorient as needed  HLD (hyperlipidemia) - Continue statin  AKI (acute kidney injury) ([place]) - Creatinine improved with hydration  Severe Sepsis - Temp 103, heart rate 109, respiratory rate 29, with AKI, and acute metabolic encephalopathy as well as lactic acidosis - resolved now   Discharge Diagnoses:  Principal Problem:   Acute Confusion Active Problems:   Severe Sepsis   AKI (acute kidney injury) ([place])   HLD (hyperlipidemia)   Dementia ([place])   GERD (gastroesophageal reflux disease)   Discharge Instructions:  Allergies as of 07/10/2022   No Known Allergies      Medication List     TAKE these medications    acetaminophen 650 MG CR tablet Commonly known as: TYLENOL Take 650 mg by mouth every 8 (eight) hours as needed for pain.   amLODipine 10 MG tablet Commonly known as: NORVASC Take 10 mg by mouth daily.   ascorbic acid 500 MG tablet Commonly known as: VITAMIN C Take 500 mg by mouth daily.   cefdinir 300 MG capsule Commonly known as: OMNICEF Take 2 capsules (600 mg total) by mouth daily for 3 days.   donepezil 5 MG tablet Commonly known as: ARICEPT Take 1 tablet by mouth daily.   doxycycline 100 MG tablet Commonly known as: VIBRA-TABS Take 1 tablet (100 mg total) by mouth every 12 (twelve) hours for 3 days.  famotidine 20 MG tablet Commonly known as: PEPCID Take 20 mg by mouth 2 (two) times daily.   Farxiga 10 MG Tabs tablet Generic drug: dapagliflozin propanediol Take 10 mg by mouth daily.   Fish Oil 1000 MG Caps Take 1,000 mg by mouth daily.   ibuprofen 200 MG tablet Commonly known as: ADVIL Take 200 mg by mouth every 6 (six) hours as needed for headache or mild pain.   IRON 27 PO Take 27 mg by mouth 2 (two) times daily.   pravastatin 40 MG tablet Commonly known as: PRAVACHOL Take 40 mg  by mouth daily.   saccharomyces boulardii 250 MG capsule Commonly known as: Florastor Take 1 capsule (250 mg total) by mouth 2 (two) times daily for 14 days.   SENIOR MULTIVITAMIN PLUS PO Take 1 tablet by mouth daily.   venlafaxine XR 37.5 MG 24 hr capsule Commonly known as: EFFEXOR-XR Take 2 capsules (75 mg total) by mouth daily with breakfast.        Follow-up Information     Rebekah Chesterfieldickey, [place] M, NP. Schedule an appointment as soon as possible for a visit in 1 week(s).   Specialty: Internal Medicine Why: Hospital Follow Up Contact information: [address] [address] [place] [id number] [phone]                No Known Allergies Allergies as of 07/10/2022   No Known Allergies      Medication List     TAKE these medications    acetaminophen 650 MG CR tablet Commonly known as: TYLENOL Take 650 mg by mouth every 8 (eight) hours as needed for pain.   amLODipine 10 MG tablet Commonly known as: NORVASC Take 10 mg by mouth daily.   ascorbic acid 500 MG tablet Commonly known as: VITAMIN C Take 500 mg by mouth daily.   cefdinir 300 MG capsule Commonly known as: OMNICEF Take 2 capsules (600 mg total) by mouth daily for 3 days.   donepezil 5 MG tablet Commonly known as: ARICEPT Take 1 tablet by mouth daily.   doxycycline 100 MG tablet Commonly known as: VIBRA-TABS Take 1 tablet (100 mg total) by mouth every 12 (twelve) hours for 3 days.   famotidine 20 MG tablet Commonly known as: PEPCID Take 20 mg by mouth 2 (two) times daily.   Farxiga 10 MG Tabs tablet Generic drug: dapagliflozin propanediol Take 10 mg by mouth daily.   Fish Oil 1000 MG Caps Take 1,000 mg by mouth daily.   ibuprofen 200 MG tablet Commonly known as: ADVIL Take 200 mg by mouth every 6 (six) hours as needed for headache or mild pain.   IRON 27 PO Take 27 mg by mouth 2 (two) times daily.   pravastatin 40 MG tablet Commonly known as: PRAVACHOL Take 40 mg by mouth daily.    saccharomyces boulardii 250 MG capsule Commonly known as: Florastor Take 1 capsule (250 mg total) by mouth 2 (two) times daily for 14 days.   SENIOR MULTIVITAMIN PLUS PO Take 1 tablet by mouth daily.   venlafaxine XR 37.5 MG 24 hr capsule Commonly known as: EFFEXOR-XR Take 2 capsules (75 mg total) by mouth daily with breakfast.        Procedures/Studies: DG CHEST PORT 1 VIEW  Result Date: 07/08/2022 CLINICAL DATA:  Pt with sepsis and fever possibly due to UTI, pt states she is feeling well today with no CP or SOB. [id number] EXAM: PORTABLE CHEST 1 VIEW COMPARISON:  None Available. FINDINGS: Normal  mediastinum and cardiac silhouette. Normal pulmonary vasculature. No evidence of effusion, infiltrate, or pneumothorax. No acute bony abnormality. IMPRESSION: No acute cardiopulmonary process. Electronically Signed   By: Genevive Bi M.D.   On: 07/08/2022 11:12   CT Head Wo Contrast  Result Date: 07/08/2022 CLINICAL DATA:  Altered mental status EXAM: CT HEAD WITHOUT CONTRAST TECHNIQUE: Contiguous axial images were obtained from the base of the skull through the vertex without intravenous contrast. RADIATION DOSE REDUCTION: This exam was performed according to the departmental dose-optimization program which includes automated exposure control, adjustment of the mA and/or kV according to patient size and/or use of iterative reconstruction technique. COMPARISON:  08/16/2018 FINDINGS: Brain: No evidence of acute infarction, hemorrhage, hydrocephalus, extra-axial collection or mass lesion/mass effect. Chronic white matter ischemic changes are noted. Vascular: No hyperdense vessel or unexpected calcification. Skull: Normal. Negative for fracture or focal lesion. Sinuses/Orbits: No acute finding. Other: None. IMPRESSION: Chronic white matter ischemic changes stable from the prior study. No acute abnormality noted. Electronically Signed   By: Alcide Clever M.D.   On: 07/08/2022 03:00      Subjective: Pt reports feeling much better today.    Discharge Exam: Vitals:   07/09/22 2112 07/10/22 0322  BP: (!) 159/74 (!) 160/79  Pulse: 89 86  Resp: 16 16  Temp: 98.2 F (36.8 C) 98.1 F (36.7 C)  SpO2: 99% 99%   Vitals:   07/09/22 0459 07/09/22 1440 07/09/22 2112 07/10/22 0322  BP: 129/66 132/61 (!) 159/74 (!) 160/79  Pulse: 88 83 89 86  Resp: 20 20 16 16   Temp: 98.9 F (37.2 C) 98.4 F (36.9 C) 98.2 F (36.8 C) 98.1 F (36.7 C)  TempSrc: Oral Oral    SpO2: 98% 96% 99% 99%  Weight:      Height:       General: Pt is alert, awake, not in acute distress Cardiovascular: RRR, S1/S2 +, no rubs, no gallops Respiratory: CTA bilaterally, no wheezing, no rhonchi Abdominal: Soft, NT, ND, bowel sounds + Extremities: no edema, no cyanosis   The results of significant diagnostics from this hospitalization (including imaging, microbiology, ancillary and laboratory) are listed below for reference.     Microbiology: Recent Results (from the past 240 hour(s))  Urine Culture     Status: Abnormal (Preliminary result)   Collection Time: 07/08/22  3:35 AM   Specimen: Urine, Clean Catch  Result Value Ref Range Status   Specimen Description   Final    URINE, CLEAN CATCH Performed at [facility], [address], [place], [place]    Special Requests   Final    NONE Performed at [facility], [address], [place], [place]    Culture >=100,000 COLONIES/mL ESCHERICHIA COLI (A)  Final   Report Status PENDING  Incomplete  Culture, blood (Routine X 2) w Reflex to ID Panel     Status: None (Preliminary result)   Collection Time: 07/08/22  4:00 AM   Specimen: Left Antecubital; Blood  Result Value Ref Range Status   Specimen Description   Final    LEFT ANTECUBITAL BOTTLES DRAWN AEROBIC AND ANAEROBIC   Special Requests Blood Culture adequate volume  Final   Culture   Final    NO GROWTH 2 DAYS Performed at [facility], [address], [place], [place]    Report Status PENDING  Incomplete  Culture, blood (Routine X 2) w Reflex to ID Panel     Status: None (Preliminary result)   Collection  Time: 07/08/22  4:09 AM   Specimen: BLOOD LEFT HAND  Result Value Ref Range Status   Specimen Description   Final    BLOOD LEFT HAND BOTTLES DRAWN AEROBIC AND ANAEROBIC   Special Requests Blood Culture adequate volume  Final   Culture   Final    NO GROWTH 2 DAYS Performed at [facility], [address], [place], [place] [address]    Report Status PENDING  Incomplete     Labs: BNP (last 3 results) No results for input(s): "BNP" in the last 8760 hours. Basic Metabolic Panel: Recent Labs  Lab 07/08/22 0125 07/08/22 0529 07/09/22 0549  NA 136 139 137  K 4.2 3.9 4.1  CL 101 110 108  CO2 22 21* 20*  GLUCOSE 174* 116* 108*  BUN 21 20 14   CREATININE 1.80* 1.70* 1.70*  CALCIUM 9.7 8.6* 8.2*  MG  --  2.3  --    Liver Function Tests: Recent Labs  Lab 07/08/22 0125 07/08/22 0529  AST 38 53*  ALT 30 40  ALKPHOS 61 51  BILITOT 0.9 0.7  PROT 8.1 6.6  ALBUMIN 4.2 3.4*   No results for input(s): "LIPASE", "AMYLASE" in the last 168 hours. No results for input(s): "AMMONIA" in the last 168 hours. CBC: Recent Labs  Lab 07/08/22 0125 07/08/22 0529 07/09/22 0549 07/10/22 0628  WBC 11.3* 12.7* 12.2* 9.8  NEUTROABS  --  9.5* 9.4* 7.5  HGB 14.4 13.0 12.8 13.1  HCT 43.3 38.7 38.6 38.7  MCV 77.3* 77.1* 77.2* 76.5*  PLT 290 263 227 243   Cardiac Enzymes: No results for input(s): "CKTOTAL", "CKMB", "CKMBINDEX", "TROPONINI" in the last 168 hours. BNP: Invalid input(s): "POCBNP" CBG: Recent Labs  Lab 07/08/22 0054  GLUCAP 196*   D-Dimer No results for input(s): "DDIMER" in the last 72 hours. Hgb A1c No results for input(s): "HGBA1C" in the last 72 hours. Lipid Profile No results for input(s): "CHOL", "HDL", "LDLCALC", "TRIG", "CHOLHDL", "LDLDIRECT" in the last 72 hours. Thyroid function studies No results for input(s):  "TSH", "T4TOTAL", "T3FREE", "THYROIDAB" in the last 72 hours.  Invalid input(s): "FREET3" Anemia work up No results for input(s): "VITAMINB12", "FOLATE", "FERRITIN", "TIBC", "IRON", "RETICCTPCT" in the last 72 hours. Urinalysis    Component Value Date/Time   COLORURINE STRAW (A) 07/08/2022 0101   APPEARANCEUR HAZY (A) 07/08/2022 0101   LABSPEC 1.006 07/08/2022 0101   PHURINE 6.0 07/08/2022 0101   GLUCOSEU >=500 (A) 07/08/2022 0101   HGBUR MODERATE (A) 07/08/2022 0101   BILIRUBINUR NEGATIVE 07/08/2022 0101   KETONESUR NEGATIVE 07/08/2022 0101   PROTEINUR NEGATIVE 07/08/2022 0101   UROBILINOGEN 1.0 11/04/2007 0201   NITRITE NEGATIVE 07/08/2022 0101   LEUKOCYTESUR LARGE (A) 07/08/2022 0101   Sepsis Labs Recent Labs  Lab 07/08/22 0125 07/08/22 0529 07/09/22 0549 07/10/22 0628  WBC 11.3* 12.7* 12.2* 9.8   Microbiology Recent Results (from the past 240 hour(s))  Urine Culture     Status: Abnormal (Preliminary result)   Collection Time: 07/08/22  3:35 AM   Specimen: Urine, Clean Catch  Result Value Ref Range Status   Specimen Description   Final    URINE, CLEAN CATCH Performed at [facility], [address], [place], [place]    Special Requests   Final    NONE Performed at [facility], [address] [place], [place]    Culture >=100,000 COLONIES/mL ESCHERICHIA COLI (A)  Final   Report Status PENDING  Incomplete  Culture, blood (Routine X 2) w  Reflex to ID Panel     Status: None (Preliminary result)   Collection Time: 07/08/22  4:00 AM   Specimen: Left Antecubital; Blood  Result Value Ref Range Status   Specimen Description   Final    LEFT ANTECUBITAL BOTTLES DRAWN AEROBIC AND ANAEROBIC   Special Requests Blood Culture adequate volume  Final   Culture   Final    NO GROWTH 2 DAYS Performed at [facility], [address], [place], [place] [address]    Report Status PENDING  Incomplete  Culture, blood (Routine X 2) w Reflex to ID Panel     Status:  None (Preliminary result)   Collection Time: 07/08/22  4:09 AM   Specimen: BLOOD LEFT HAND  Result Value Ref Range Status   Specimen Description   Final    BLOOD LEFT HAND BOTTLES DRAWN AEROBIC AND ANAEROBIC   Special Requests Blood Culture adequate volume  Final   Culture   Final    NO GROWTH 2 DAYS Performed at [facility], [address], [place], [place] [address]    Report Status PENDING  Incomplete   Time coordinating discharge: 36 mins  SIGNED:  Standley Dakins, MD  [facility] 07/10/2022, 11:47 AM How to contact the [facility] Attending or Consulting provider 7A - 7P or covering provider during after hours 7P -7A, for this patient?  Check the care team in [facility] and look for a) attending/consulting [facility] provider listed and b) the [facility] team listed Log into [email or web address] and use [place]'s universal password to access. If you do not have the password, please contact the hospital operator. Locate the [facility] provider you are looking for under [facility] and page to a number that you can be directly reached. If you still have difficulty reaching the provider, please page the [facility] (Director on Call) for the Hospitalists listed on amion for assistance.

## 2022-07-10 NOTE — Plan of Care (Signed)
  Problem: Education: Goal: Knowledge of General Education information will improve Description: Including pain rating scale, medication(s)/side effects and non-pharmacologic comfort measures Outcome: Progressing   Problem: Health Behavior/Discharge Planning: Goal: Ability to manage health-related needs will improve Outcome: Progressing   Problem: Clinical Measurements: Goal: Ability to maintain clinical measurements within normal limits will improve Outcome: Progressing Goal: Will remain free from infection Outcome: Progressing Goal: Diagnostic test results will improve Outcome: Progressing Goal: Respiratory complications will improve Outcome: Progressing Goal: Cardiovascular complication will be avoided Outcome: Progressing   Problem: Activity: Goal: Risk for activity intolerance will decrease Outcome: Progressing   Problem: Nutrition: Goal: Adequate nutrition will be maintained Outcome: Progressing   Problem: Coping: Goal: Level of anxiety will decrease Outcome: Progressing   Problem: Elimination: Goal: Will not experience complications related to bowel motility Outcome: Progressing Goal: Will not experience complications related to urinary retention Outcome: Progressing   Problem: Pain Managment: Goal: General experience of comfort will improve Outcome: Progressing   Problem: Safety: Goal: Ability to remain free from injury will improve Outcome: Progressing   Problem: Skin Integrity: Goal: Risk for impaired skin integrity will decrease Outcome: Progressing   Problem: Fluid Volume: Goal: Hemodynamic stability will improve Outcome: Progressing   Problem: Clinical Measurements: Goal: Diagnostic test results will improve Outcome: Progressing Goal: Signs and symptoms of infection will decrease Outcome: Progressing   Problem: Respiratory: Goal: Ability to maintain adequate ventilation will improve Outcome: Progressing   Problem: Education: Goal:  Knowledge of General Education information will improve Description: Including pain rating scale, medication(s)/side effects and non-pharmacologic comfort measures Outcome: Progressing   Problem: Health Behavior/Discharge Planning: Goal: Ability to manage health-related needs will improve Outcome: Progressing   Problem: Clinical Measurements: Goal: Ability to maintain clinical measurements within normal limits will improve Outcome: Progressing Goal: Will remain free from infection Outcome: Progressing Goal: Diagnostic test results will improve Outcome: Progressing Goal: Respiratory complications will improve Outcome: Progressing Goal: Cardiovascular complication will be avoided Outcome: Progressing   Problem: Activity: Goal: Risk for activity intolerance will decrease Outcome: Progressing   Problem: Nutrition: Goal: Adequate nutrition will be maintained Outcome: Progressing   Problem: Coping: Goal: Level of anxiety will decrease Outcome: Progressing   Problem: Elimination: Goal: Will not experience complications related to bowel motility Outcome: Progressing Goal: Will not experience complications related to urinary retention Outcome: Progressing   Problem: Pain Managment: Goal: General experience of comfort will improve Outcome: Progressing   Problem: Safety: Goal: Ability to remain free from injury will improve Outcome: Progressing   Problem: Skin Integrity: Goal: Risk for impaired skin integrity will decrease Outcome: Progressing   

## 2022-07-10 NOTE — Plan of Care (Signed)
  Problem: Education: Goal: Knowledge of General Education information will improve Description: Including pain rating scale, medication(s)/side effects and non-pharmacologic comfort measures Outcome: Adequate for Discharge   Problem: Health Behavior/Discharge Planning: Goal: Ability to manage health-related needs will improve Outcome: Adequate for Discharge   Problem: Clinical Measurements: Goal: Ability to maintain clinical measurements within normal limits will improve Outcome: Adequate for Discharge Goal: Will remain free from infection Outcome: Adequate for Discharge Goal: Diagnostic test results will improve Outcome: Adequate for Discharge Goal: Respiratory complications will improve Outcome: Adequate for Discharge Goal: Cardiovascular complication will be avoided Outcome: Adequate for Discharge   Problem: Activity: Goal: Risk for activity intolerance will decrease Outcome: Adequate for Discharge   Problem: Nutrition: Goal: Adequate nutrition will be maintained Outcome: Adequate for Discharge   Problem: Coping: Goal: Level of anxiety will decrease Outcome: Adequate for Discharge   Problem: Elimination: Goal: Will not experience complications related to bowel motility Outcome: Adequate for Discharge Goal: Will not experience complications related to urinary retention Outcome: Adequate for Discharge   Problem: Pain Managment: Goal: General experience of comfort will improve Outcome: Adequate for Discharge   Problem: Safety: Goal: Ability to remain free from injury will improve Outcome: Adequate for Discharge   Problem: Skin Integrity: Goal: Risk for impaired skin integrity will decrease Outcome: Adequate for Discharge   Problem: Education: Goal: Knowledge of General Education information will improve Description: Including pain rating scale, medication(s)/side effects and non-pharmacologic comfort measures Outcome: Adequate for Discharge   Problem: Health  Behavior/Discharge Planning: Goal: Ability to manage health-related needs will improve Outcome: Adequate for Discharge   Problem: Clinical Measurements: Goal: Ability to maintain clinical measurements within normal limits will improve Outcome: Adequate for Discharge Goal: Will remain free from infection Outcome: Adequate for Discharge Goal: Diagnostic test results will improve Outcome: Adequate for Discharge Goal: Respiratory complications will improve Outcome: Adequate for Discharge Goal: Cardiovascular complication will be avoided Outcome: Adequate for Discharge   Problem: Activity: Goal: Risk for activity intolerance will decrease Outcome: Adequate for Discharge   Problem: Nutrition: Goal: Adequate nutrition will be maintained Outcome: Adequate for Discharge   Problem: Coping: Goal: Level of anxiety will decrease Outcome: Adequate for Discharge   Problem: Elimination: Goal: Will not experience complications related to bowel motility Outcome: Adequate for Discharge Goal: Will not experience complications related to urinary retention Outcome: Adequate for Discharge   Problem: Pain Managment: Goal: General experience of comfort will improve Outcome: Adequate for Discharge   Problem: Safety: Goal: Ability to remain free from injury will improve Outcome: Adequate for Discharge   Problem: Skin Integrity: Goal: Risk for impaired skin integrity will decrease Outcome: Adequate for Discharge   Problem: Fluid Volume: Goal: Hemodynamic stability will improve Outcome: Adequate for Discharge   Problem: Clinical Measurements: Goal: Diagnostic test results will improve Outcome: Adequate for Discharge Goal: Signs and symptoms of infection will decrease Outcome: Adequate for Discharge   Problem: Respiratory: Goal: Ability to maintain adequate ventilation will improve Outcome: Adequate for Discharge

## 2022-07-11 LAB — URINE CULTURE: Culture: >=100000 — AB

## 2022-07-13 LAB — CULTURE, BLOOD (ROUTINE X 2)
Culture: NO GROWTH
Culture: NO GROWTH
Special Requests: ADEQUATE
Special Requests: ADEQUATE

## 2022-10-24 ENCOUNTER — Encounter (HOSPITAL_COMMUNITY): Payer: Self-pay

## 2022-10-24 ENCOUNTER — Emergency Department (HOSPITAL_COMMUNITY)
Admission: EM | Admit: 2022-10-24 | Discharge: 2022-10-24 | Disposition: A | Discharge status: Home / Self Care | Payer: 59 | Attending: Emergency Medicine | Admitting: Emergency Medicine

## 2022-10-24 ENCOUNTER — Other Ambulatory Visit: Payer: Self-pay

## 2022-10-24 DIAGNOSIS — E119 Type 2 diabetes mellitus without complications: Secondary | ICD-10-CM | POA: Diagnosis not present

## 2022-10-24 DIAGNOSIS — R42 Dizziness and giddiness: Secondary | ICD-10-CM | POA: Diagnosis not present

## 2022-10-24 DIAGNOSIS — N3090 Cystitis, unspecified without hematuria: Secondary | ICD-10-CM | POA: Diagnosis not present

## 2022-10-24 DIAGNOSIS — I10 Essential (primary) hypertension: Secondary | ICD-10-CM | POA: Insufficient documentation

## 2022-10-24 DIAGNOSIS — Z79899 Other long term (current) drug therapy: Secondary | ICD-10-CM | POA: Diagnosis not present

## 2022-10-24 DIAGNOSIS — Z7984 Long term (current) use of oral hypoglycemic drugs: Secondary | ICD-10-CM | POA: Insufficient documentation

## 2022-10-24 LAB — URINALYSIS, ROUTINE W REFLEX MICROSCOPIC
Bilirubin Urine: NEGATIVE
Glucose, UA: >=500 mg/dL — AB
Hgb urine dipstick: NEGATIVE
Ketones, ur: NEGATIVE mg/dL
Nitrite: POSITIVE — AB
Protein, ur: NEGATIVE mg/dL
Specific Gravity, Urine: 1.01 (ref 1.005–1.030)
pH: 5 (ref 5.0–8.0)

## 2022-10-24 LAB — BASIC METABOLIC PANEL
Anion gap: 10 (ref 5–15)
BUN: 18 mg/dL (ref 8–23)
CO2: 24 mmol/L (ref 22–32)
Calcium: 9.4 mg/dL (ref 8.9–10.3)
Chloride: 103 mmol/L (ref 98–111)
Creatinine, Ser: 1.46 mg/dL — ABNORMAL HIGH (ref 0.44–1.00)
GFR, Estimated: 38 mL/min — ABNORMAL LOW (ref >60)
Glucose, Bld: 85 mg/dL (ref 70–99)
Potassium: 4.1 mmol/L (ref 3.5–5.1)
Sodium: 137 mmol/L (ref 135–145)

## 2022-10-24 LAB — CBC
HCT: 45.9 % (ref 36.0–46.0)
Hemoglobin: 15.1 g/dL — ABNORMAL HIGH (ref 12.0–15.0)
MCH: 25.5 pg — ABNORMAL LOW (ref 26.0–34.0)
MCHC: 32.9 g/dL (ref 30.0–36.0)
MCV: 77.7 fL — ABNORMAL LOW (ref 80.0–100.0)
Platelets: 324 10*3/uL (ref 150–400)
RBC: 5.91 MIL/uL — ABNORMAL HIGH (ref 3.87–5.11)
RDW: 17.5 % — ABNORMAL HIGH (ref 11.5–15.5)
WBC: 8 10*3/uL (ref 4.0–10.5)
nRBC: 0 % (ref 0.0–0.2)

## 2022-10-24 LAB — CBG MONITORING, ED: Glucose-Capillary: 85 mg/dL (ref 70–99)

## 2022-10-24 MED ORDER — LACTATED RINGERS IV BOLUS
1000.0000 mL | Freq: Once | INTRAVENOUS | Status: AC
  Administered 2022-10-24: 1000 mL via INTRAVENOUS

## 2022-10-24 MED ORDER — CEPHALEXIN 500 MG PO CAPS
500.0000 mg | ORAL_CAPSULE | Freq: Once | ORAL | Status: AC
  Administered 2022-10-24: 500 mg via ORAL
  Filled 2022-10-24: qty 1

## 2022-10-24 MED ORDER — CEPHALEXIN 500 MG PO CAPS: 500.0000 mg | ORAL_CAPSULE | Freq: Four times a day (QID) | ORAL | 0 refills | Status: AC

## 2022-10-24 MED ORDER — MECLIZINE HCL 25 MG PO TABS: 25.0000 mg | ORAL_TABLET | Freq: Three times a day (TID) | ORAL | 0 refills | Status: AC | PRN

## 2022-10-24 MED ORDER — MECLIZINE HCL 12.5 MG PO TABS
25.0000 mg | ORAL_TABLET | Freq: Once | ORAL | Status: AC
  Administered 2022-10-24: 25 mg via ORAL
  Filled 2022-10-24: qty 2

## 2022-10-24 NOTE — ED Notes (Signed)
Ambulatory to restroom and back without assist.

## 2022-10-24 NOTE — Discharge Instructions (Addendum)
There were prescriptions that were sent to your pharmacy: -Cephalexin is a antibiotic to treat a UTI.  Take this as prescribed.  Follow-up with your primary care doctor for urine culture results. -Meclizine is a medication to treat dizziness.  Take this only as needed.  Return to the emergency department at any time if you experience any new or worsening symptoms of concern.

## 2022-10-24 NOTE — ED Notes (Signed)
Pharm in room  

## 2022-10-24 NOTE — ED Triage Notes (Signed)
Pt presents to ED with complaints of dizziness x 3 days, pt feels like room is spinning.

## 2022-10-24 NOTE — ED Notes (Signed)
ED Provider at bedside. 

## 2022-10-24 NOTE — ED Provider Notes (Signed)
[name] EMERGENCY DEPARTMENT Provider Note   CSN: [id number] Arrival date & time: 10/24/22  1646     History  Chief Complaint  Patient presents with   Dizziness    Beth Meza is a 77 y.o. female.   Dizziness Associated symptoms: nausea   Patient presents for dizziness.  Medical history includes prior episodes of dizziness, DM, HLD, GERD, HTN, seizures, anemia.  Dizziness has been present for the past 3 days.  It is worsened with position changes.  She describes a room spinning sensation.  She states that she has a history of similar symptoms in the past.  She believes that her last episode was 1 to 2 years ago.  She does not have any antidizziness medication at home.  When her dizziness worsens, she will experience nausea.  She has not had any vomiting.  She has had normal p.o. intake.  She denies any other recent symptoms.  Currently, at rest, she is not experiencing any symptoms.  She will continue to have vertiginous symptoms with position changes.     Home Medications Prior to Admission medications   Medication Sig Start Date End Date Taking? Authorizing Provider  acetaminophen (TYLENOL) 650 MG CR tablet Take 650 mg by mouth every 8 (eight) hours as needed for pain.   Yes [provider]  amLODipine (NORVASC) 10 MG tablet Take 10 mg by mouth daily. 06/03/22  Yes [provider]  cephALEXin (KEFLEX) 500 MG capsule Take 1 capsule (500 mg total) by mouth 4 (four) times daily for 5 days. 10/24/22 10/29/22 Yes Godfrey Pick, MD  donepezil (ARICEPT) 5 MG tablet Take 1 tablet by mouth daily. 07/10/19  Yes [provider]  famotidine (PEPCID) 20 MG tablet Take 20 mg by mouth 2 (two) times daily. 01/31/19  Yes [provider]  FARXIGA 10 MG TABS tablet Take 10 mg by mouth daily. 08/30/20  Yes [provider]  Ferrous Gluconate (IRON 27 PO) Take 27 mg by mouth daily.   Yes [provider]  ibuprofen (ADVIL) 200 MG tablet Take 200 mg  by mouth every 6 (six) hours as needed for headache or mild pain.   Yes [provider]  meclizine (ANTIVERT) 25 MG tablet Take 1 tablet (25 mg total) by mouth 3 (three) times daily as needed for dizziness. 10/24/22  Yes Godfrey Pick, MD  Multiple Vitamins-Minerals (SENIOR MULTIVITAMIN PLUS PO) Take 1 tablet by mouth daily.   Yes [provider]  Omega-3 Fatty Acids (FISH OIL) 1000 MG CAPS Take 1,000 mg by mouth daily.    Yes [provider]  pravastatin (PRAVACHOL) 40 MG tablet Take 40 mg by mouth daily. 02/11/14  Yes [provider]  venlafaxine XR (EFFEXOR-XR) 37.5 MG 24 hr capsule Take 2 capsules (75 mg total) by mouth daily with breakfast. 12/05/21  Yes Suzzanne Cloud, NP  vitamin C (ASCORBIC ACID) 500 MG tablet Take 500 mg by mouth daily.   Yes [provider]      Allergies    Oxycodone    Review of Systems   Review of Systems  Gastrointestinal:  Positive for nausea.  Neurological:  Positive for dizziness.  All other systems reviewed and are negative.   Physical Exam Updated Vital Signs BP (!) 166/82   Pulse 72   Temp 98.1 F (36.7 C) (Oral)   Resp 17   Ht 5' 5.5" (1.664 m)   Wt 74.8 kg   SpO2 98%   BMI 27.04 kg/m  Physical Exam Vitals and nursing note reviewed.  Constitutional:      General: She is not in acute distress.    Appearance: Normal appearance. She is well-developed. She is not ill-appearing, toxic-appearing or diaphoretic.  HENT:     Head: Normocephalic and atraumatic.     Right Ear: External ear normal.     Left Ear: External ear normal.     Nose: Nose normal.     Mouth/Throat:     Mouth: Mucous membranes are moist.  Eyes:     Extraocular Movements: Extraocular movements intact.     Conjunctiva/sclera: Conjunctivae normal.  Cardiovascular:     Rate and Rhythm: Normal rate and regular rhythm.     Heart sounds: No murmur heard. Pulmonary:     Effort: Pulmonary effort is normal. No respiratory distress.   Abdominal:     General: There is no distension.     Palpations: Abdomen is soft.     Tenderness: There is no abdominal tenderness.  Musculoskeletal:        General: No swelling. Normal range of motion.     Cervical back: Normal range of motion and neck supple.     Right lower leg: No edema.     Left lower leg: No edema.  Skin:    General: Skin is warm and dry.     Capillary Refill: Capillary refill takes less than 2 seconds.     Coloration: Skin is not jaundiced or pale.  Neurological:     Mental Status: She is alert.     Cranial Nerves: Cranial nerves 2-12 are intact. No cranial nerve deficit, dysarthria or facial asymmetry.     Sensory: Sensation is intact. No sensory deficit.     Motor: Motor function is intact. No weakness, abnormal muscle tone or pronator drift.     Coordination: Finger-Nose-Finger Test normal.     Comments: Dix-Hallpike maneuver causing worsened symptoms and extinguishing horizontal nystagmus, primarily when maneuvered to the left.  Psychiatric:        Mood and Affect: Mood normal.        Behavior: Behavior normal.     ED Results / Procedures / Treatments   Labs (all labs ordered are listed, but only abnormal results are displayed) Labs Reviewed  BASIC METABOLIC PANEL - Abnormal; Notable for the following components:      Result Value   Creatinine, Ser 1.46 (*)    GFR, Estimated 38 (*)    All other components within normal limits  CBC - Abnormal; Notable for the following components:   RBC 5.91 (*)    Hemoglobin 15.1 (*)    MCV 77.7 (*)    MCH 25.5 (*)    RDW 17.5 (*)    All other components within normal limits  URINALYSIS, ROUTINE W REFLEX MICROSCOPIC - Abnormal; Notable for the following components:   Glucose, UA >=500 (*)    Nitrite POSITIVE (*)    Leukocytes,Ua MODERATE (*)    Bacteria, UA MANY (*)    All other components within normal limits  URINE CULTURE  CBG MONITORING, ED    EKG EKG Interpretation  Date/Time:  Tuesday October 24 2022 16:59:11 EST Ventricular Rate:  79 PR Interval:  148 QRS Duration: 74 QT Interval:  380 QTC Calculation: 435 R Axis:   54 Text Interpretation: Normal sinus rhythm Low voltage QRS Borderline ECG Confirmed by Gloris Manchester ([id number]) on 10/24/2022 6:13:25 PM  Radiology No results found.  Procedures Procedures    Medications Ordered  in ED Medications  cephALEXin (KEFLEX) capsule 500 mg (has no administration in time range)  meclizine (ANTIVERT) tablet 25 mg (25 mg Oral Given 10/24/22 2016)  lactated ringers bolus 1,000 mL (1,000 mLs Intravenous New Bag/Given 10/24/22 2016)    ED Course/ Medical Decision Making/ A&P                             Medical Decision Making Amount and/or Complexity of Data Reviewed Labs: ordered.  Risk Prescription drug management.   This patient presents to the ED for concern of dizziness, this involves an extensive number of treatment options, and is a complaint that carries with it a high risk of complications and morbidity.  The differential diagnosis includes vestibular neuritis, BPPV, Mnire's disease, CVA   Co morbidities that complicate the patient evaluation  prior episodes of dizziness, DM, HLD, GERD, HTN, seizures, anemia   Additional history obtained:  Additional history obtained from patient's son External records from outside source obtained and reviewed including EMR   Lab Tests:  I Ordered, and personally interpreted labs.  The pertinent results include: No leukocytosis, increased hemoglobin from baseline, improved creatinine from baseline, normal electrolytes, urinalysis shows nitrate positive bacteria and pyuria.  Cardiac Monitoring: / EKG:  The patient was maintained on a cardiac monitor.  I personally viewed and interpreted the cardiac monitored which showed an underlying rhythm of: Sinus rhythm   Problem List / ED Course / Critical interventions / Medication management  Patient presenting for dizziness for the past  3 days.  Prior to being bedded in the ED, diagnostic workup was initiated.  Vital signs were notable for moderate hypertension.  BMP shows creatinine slightly improved from baseline with normal electrolytes.  Hemoglobin is elevated suggesting hemoconcentration.  No leukocytosis is present.  On exam, patient is well-appearing.  She states that her vertiginous symptoms occur with positional changes.  Currently, at rest, she does not experience any dizziness.  She has no focal neurologic deficits on exam.  When administering Dix-Hallpike maneuver, she does have worsening symptoms, primarily with maneuver to the left.  Epley maneuver was trialed.  Dose of meclizine was ordered.  On urinalysis, patient has x-ray positive for bacteria and pyuria.  She was informed of this.  She does state that she has had recent urinary frequency.  Patient to be treated for cystitis.  Dose of Keflex was given in the ED.  On reassessment, patient had complete resolution of her vertiginous symptoms.  She was able to ambulate to the bathroom several times without any symptom recurrence.  She does feel comfortable discharge home at this time.  She was given prescriptions for Keflex and meclizine.  Currently, she has a PCP appointment scheduled for 2 days from now.  She was advised to follow-up on results of urine cultures.  She was discharged in good condition. I ordered medication including IV fluids for hydration; meclizine for dizziness; Keflex for cystitis Reevaluation of the patient after these medicines showed that the patient resolved I have reviewed the patients home medicines and have made adjustments as needed   Social Determinants of Health:  PCP, including scheduled close follow-up appointment        Final Clinical Impression(s) / ED Diagnoses Final diagnoses:  Dizziness  Cystitis    Rx / DC Orders ED Discharge Orders          Ordered    cephALEXin (KEFLEX) 500 MG capsule  4 times daily  10/24/22  2300    meclizine (ANTIVERT) 25 MG tablet  3 times daily PRN        10/24/22 2300              Godfrey Pick, MD 10/24/22 2301

## 2022-10-27 LAB — URINE CULTURE: Culture: >=100000 — AB

## 2022-10-28 ENCOUNTER — Telehealth (HOSPITAL_BASED_OUTPATIENT_CLINIC_OR_DEPARTMENT_OTHER): Payer: Self-pay | Admitting: *Deleted

## 2022-10-28 NOTE — Telephone Encounter (Signed)
Post ED Visit - Positive Culture Follow-up  Culture report reviewed by antimicrobial stewardship pharmacist: [facility] Team []  Elenor Quinones, Pharm.D. []  Heide Guile, Pharm.D., BCPS AQ-ID []  Parks Neptune, Pharm.D., BCPS []  Alycia Rossetti, Pharm.D., BCPS []  [place], Pharm.D., BCPS, AAHIVP []  Legrand Como, Pharm.D., BCPS, AAHIVP []  Salome Arnt, PharmD, BCPS []  Johnnette Gourd, PharmD, BCPS []  Hughes Better, PharmD, BCPS []  Leeroy Cha, PharmD []  Laqueta Linden, PharmD, BCPS [x]  Franchot Gallo, PharmD  [place] Team []  Leodis Sias, PharmD []  Lindell Spar, PharmD []  Royetta Asal, PharmD []  Graylin Shiver, Rph []  Rema Fendt) Glennon Mac, PharmD []  Arlyn Dunning, PharmD []  Netta Cedars, PharmD []  Dia Sitter, PharmD []  Leone Haven, PharmD []  Gretta Arab, PharmD []  Theodis Shove, PharmD []  Peggyann Juba, PharmD []  Reuel Boom, PharmD   Positive urine culture Treated with Cephalexin, organism sensitive to the same and no further patient follow-up is required at this time.  Rosie Fate 10/28/2022, 1:13 PM

## 2022-12-26 ENCOUNTER — Emergency Department (HOSPITAL_COMMUNITY): Payer: 59

## 2022-12-26 ENCOUNTER — Other Ambulatory Visit: Payer: Self-pay

## 2022-12-26 ENCOUNTER — Emergency Department (HOSPITAL_COMMUNITY)
Admission: EM | Admit: 2022-12-26 | Discharge: 2022-12-26 | Disposition: A | Discharge status: Home / Self Care | Payer: 59 | Attending: Emergency Medicine | Admitting: Emergency Medicine

## 2022-12-26 DIAGNOSIS — R4182 Altered mental status, unspecified: Secondary | ICD-10-CM | POA: Diagnosis present

## 2022-12-26 DIAGNOSIS — R7989 Other specified abnormal findings of blood chemistry: Secondary | ICD-10-CM | POA: Insufficient documentation

## 2022-12-26 DIAGNOSIS — N3000 Acute cystitis without hematuria: Secondary | ICD-10-CM

## 2022-12-26 DIAGNOSIS — E119 Type 2 diabetes mellitus without complications: Secondary | ICD-10-CM | POA: Insufficient documentation

## 2022-12-26 DIAGNOSIS — F0390 Unspecified dementia without behavioral disturbance: Secondary | ICD-10-CM | POA: Insufficient documentation

## 2022-12-26 LAB — CBC
HCT: 44.1 % (ref 36.0–46.0)
Hemoglobin: 14.4 g/dL (ref 12.0–15.0)
MCH: 25.1 pg — ABNORMAL LOW (ref 26.0–34.0)
MCHC: 32.7 g/dL (ref 30.0–36.0)
MCV: 77 fL — ABNORMAL LOW (ref 80.0–100.0)
Platelets: 280 10*3/uL (ref 150–400)
RBC: 5.73 MIL/uL — ABNORMAL HIGH (ref 3.87–5.11)
RDW: 17.3 % — ABNORMAL HIGH (ref 11.5–15.5)
WBC: 8.6 10*3/uL (ref 4.0–10.5)
nRBC: 0 % (ref 0.0–0.2)

## 2022-12-26 LAB — COMPREHENSIVE METABOLIC PANEL
ALT: 26 U/L (ref 0–44)
AST: 29 U/L (ref 15–41)
Albumin: 4.4 g/dL (ref 3.5–5.0)
Alkaline Phosphatase: 86 U/L (ref 38–126)
Anion gap: 10 (ref 5–15)
BUN: 18 mg/dL (ref 8–23)
CO2: 23 mmol/L (ref 22–32)
Calcium: 9.3 mg/dL (ref 8.9–10.3)
Chloride: 103 mmol/L (ref 98–111)
Creatinine, Ser: 1.63 mg/dL — ABNORMAL HIGH (ref 0.44–1.00)
GFR, Estimated: 33 mL/min — ABNORMAL LOW (ref >60)
Glucose, Bld: 99 mg/dL (ref 70–99)
Potassium: 3.8 mmol/L (ref 3.5–5.1)
Sodium: 136 mmol/L (ref 135–145)
Total Bilirubin: 0.5 mg/dL (ref 0.3–1.2)
Total Protein: 7.7 g/dL (ref 6.5–8.1)

## 2022-12-26 LAB — URINALYSIS, MICROSCOPIC (REFLEX)

## 2022-12-26 LAB — URINALYSIS, ROUTINE W REFLEX MICROSCOPIC
Bilirubin Urine: NEGATIVE
Glucose, UA: >=500 mg/dL — AB
Hgb urine dipstick: NEGATIVE
Ketones, ur: 15 mg/dL — AB
Nitrite: NEGATIVE
Protein, ur: NEGATIVE mg/dL
Specific Gravity, Urine: 1.015 (ref 1.005–1.030)
pH: 6 (ref 5.0–8.0)

## 2022-12-26 MED ORDER — SODIUM CHLORIDE 0.9 % IV SOLN
1.0000 g | Freq: Once | INTRAVENOUS | Status: AC
  Administered 2022-12-26: 1 g via INTRAVENOUS
  Filled 2022-12-26: qty 10

## 2022-12-26 MED ORDER — CEPHALEXIN 500 MG PO CAPS: 500.0000 mg | ORAL_CAPSULE | Freq: Four times a day (QID) | ORAL | 0 refills | Status: DC

## 2022-12-26 NOTE — ED Triage Notes (Signed)
[facility] states family was taking to patient but she was staring off and not responding for 2 minutes. Denies LOC. Pt is A&O x4 in triage. Negative stroke screen.

## 2022-12-26 NOTE — Discharge Instructions (Signed)
Your workup this evening shows that you have a likely urinary tract infection.  You have been given antibiotics here but I also have provided a prescription for additional antibiotics to take for 7 days.  Continue to drink plenty of water.  Please follow-up with your primary care provider later this week for recheck.  Return to the emergency department for any new or worsening symptoms.

## 2022-12-28 LAB — URINE CULTURE: Culture: >=100000 — AB

## 2022-12-28 NOTE — ED Provider Notes (Signed)
[facility] Provider Note   CSN: [id number] Arrival date & time: 12/26/22  1446     History  Chief Complaint  Patient presents with   Altered Mental Status    Beth Meza is a 77 y.o. female.   Altered Mental Status Presenting symptoms: no confusion   Associated symptoms: no abdominal pain, no fever, no headaches, no light-headedness, no nausea, no rash, no seizures, no vomiting and no weakness        Beth Meza is a 77 y.o. female with past medical history of recurrent vertigo, dementia, type 2 diabetes, anemia, seizures who presents to the Emergency Department via EMS from home for evaluation of brief episode of confusion.  Family member at bedside states patient had 2-minute episode of "staring off" and not responding to speech.  No loss of consciousness.  Patient does not recall this event.  Family member states that she appears back to her neurologic baseline at this time.  Family member states that she had similar episode sometime ago when she had a urinary tract infection.  No history of prior stroke or TIA.  Patient denies any symptoms.  Episode was witnessed by family member, denies any shaking or jerking activity.  Home Medications Prior to Admission medications   Medication Sig Start Date End Date Taking? Authorizing Provider  acetaminophen (TYLENOL) 650 MG CR tablet Take 650 mg by mouth every 8 (eight) hours as needed for pain.   Yes [provider]  amLODipine (NORVASC) 10 MG tablet Take 10 mg by mouth daily. 06/03/22  Yes [provider]  cephALEXin (KEFLEX) 500 MG capsule Take 1 capsule (500 mg total) by mouth 4 (four) times daily. 12/26/22  Yes , , PA-C  donepezil (ARICEPT) 5 MG tablet Take 1 tablet by mouth daily. 07/10/19  Yes [provider]  famotidine (PEPCID) 20 MG tablet Take 20 mg by mouth 2 (two) times daily. 01/31/19  Yes [provider]  FARXIGA 10 MG TABS  tablet Take 10 mg by mouth daily. 08/30/20  Yes [provider]  ferrous sulfate 325 (65 FE) MG tablet Take 325 mg by mouth daily with breakfast. 09/20/17  Yes [provider]  ibuprofen (ADVIL) 200 MG tablet Take 200 mg by mouth every 6 (six) hours as needed for headache or mild pain.   Yes [provider]  Multiple Vitamins-Minerals (PRESERVISION AREDS 2) CAPS Take 1 capsule by mouth daily.   Yes [provider]  Multiple Vitamins-Minerals (SENIOR MULTIVITAMIN PLUS PO) Take 1 tablet by mouth daily.   Yes [provider]  Omega-3 Fatty Acids (FISH OIL) 1000 MG CAPS Take 1,000 mg by mouth daily.    Yes [provider]  pravastatin (PRAVACHOL) 40 MG tablet Take 40 mg by mouth daily. 02/11/14  Yes [provider]  venlafaxine XR (EFFEXOR-XR) 37.5 MG 24 hr capsule Take 2 capsules (75 mg total) by mouth daily with breakfast. 12/05/21  Yes Suzzanne Cloud, NP  vitamin C (ASCORBIC ACID) 500 MG tablet Take 500 mg by mouth daily.   Yes [provider]  Ferrous Gluconate (IRON 27 PO) Take 27 mg by mouth daily. Patient not taking: Reported on 12/26/2022    [provider]  meclizine (ANTIVERT) 25 MG tablet Take 1 tablet (25 mg total) by mouth 3 (three) times daily as needed for dizziness. 10/24/22   Godfrey Pick, MD      Allergies    Oxycodone  Review of Systems   Review of Systems  Constitutional:  Negative for chills and fever.  HENT:  Negative for trouble swallowing.   Eyes:  Negative for visual disturbance.  Respiratory:  Negative for shortness of breath.   Cardiovascular:  Negative for chest pain.  Gastrointestinal:  Negative for abdominal pain, diarrhea, nausea and vomiting.  Genitourinary:  Negative for difficulty urinating, dysuria, flank pain and hematuria.  Musculoskeletal:  Negative for arthralgias, back pain, neck pain and neck stiffness.  Skin:  Negative for rash.  Neurological:  Negative for seizures, syncope,  speech difficulty, weakness, light-headedness and headaches.  Psychiatric/Behavioral:  Negative for confusion.     Physical Exam Updated Vital Signs BP (!) 159/79   Pulse 78   Resp 17   Ht 5\' 5"  (1.651 m)   Wt 80.7 kg   SpO2 100%   BMI 29.62 kg/m  Physical Exam Vitals and nursing note reviewed.  Constitutional:      General: She is not in acute distress.    Appearance: Normal appearance. She is not ill-appearing or toxic-appearing.  HENT:     Head: Atraumatic.     Mouth/Throat:     Mouth: Mucous membranes are moist.     Pharynx: Oropharynx is clear.  Eyes:     Extraocular Movements: Extraocular movements intact.     Conjunctiva/sclera: Conjunctivae normal.     Pupils: Pupils are equal, round, and reactive to light.  Cardiovascular:     Rate and Rhythm: Normal rate and regular rhythm.     Pulses: Normal pulses.  Pulmonary:     Effort: Pulmonary effort is normal.     Breath sounds: Normal breath sounds.  Abdominal:     Palpations: Abdomen is soft.     Tenderness: There is no abdominal tenderness.  Musculoskeletal:        General: Normal range of motion.     Cervical back: Normal range of motion. No rigidity.     Right lower leg: No edema.     Left lower leg: No edema.  Lymphadenopathy:     Cervical: No cervical adenopathy.  Skin:    General: Skin is warm.     Capillary Refill: Capillary refill takes less than 2 seconds.  Neurological:     General: No focal deficit present.     Mental Status: She is alert.     GCS: GCS eye subscore is 4. GCS verbal subscore is 5. GCS motor subscore is 6.     Sensory: Sensation is intact. No sensory deficit.     Motor: No weakness.     Comments: CN II through XII intact.  Speech clear.  No pronator drift.  No facial droop.  Patient has slow deliberate movements with right-sided finger-nose test.  No lower extremity weakness     ED Results / Procedures / Treatments   Labs (all labs ordered are listed, but only abnormal results  are displayed) Labs Reviewed  URINE CULTURE - Abnormal; Notable for the following components:      Result Value   Culture >=100,000 COLONIES/mL ESCHERICHIA COLI (*)    Organism ID, Bacteria ESCHERICHIA COLI (*)    All other components within normal limits  CBC - Abnormal; Notable for the following components:   RBC 5.73 (*)    MCV 77.0 (*)    MCH 25.1 (*)    RDW 17.3 (*)    All other components within normal limits  COMPREHENSIVE METABOLIC PANEL - Abnormal; Notable for the following components:  Creatinine, Ser 1.63 (*)    GFR, Estimated 33 (*)    All other components within normal limits  URINALYSIS, ROUTINE W REFLEX MICROSCOPIC - Abnormal; Notable for the following components:   Glucose, UA >=500 (*)    Ketones, ur 15 (*)    Leukocytes,Ua TRACE (*)    All other components within normal limits  URINALYSIS, MICROSCOPIC (REFLEX) - Abnormal; Notable for the following components:   Bacteria, UA MANY (*)    All other components within normal limits    EKG EKG Interpretation  Date/Time:  Tuesday December 26 2022 18:49:07 EDT Ventricular Rate:  75 PR Interval:  166 QRS Duration: 101 QT Interval:  460 QTC Calculation: 514 R Axis:   25 Text Interpretation: Sinus rhythm Nonspecific T abnrm, anterolateral leads Prolonged QT interval Confirmed by Godfrey Pick [phone]) on 12/26/2022 7:13:58 PM  Radiology MR BRAIN WO CONTRAST  Result Date: 12/26/2022 CLINICAL DATA:  Transient ischemic attack.  Evaluate for stroke. EXAM: MRI HEAD WITHOUT CONTRAST TECHNIQUE: Multiplanar, multiecho pulse sequences of the brain and surrounding structures were obtained without intravenous contrast. COMPARISON:  MRI brain 11/26/2012.  CT head 12/26/2022. FINDINGS: Brain: No acute infarct or intracranial hemorrhage. Mild chronic small-vessel disease. No hydrocephalus or extra-axial collection. No mass or midline shift. Vascular: Normal flow voids. Skull and upper cervical spine: Normal marrow signal. Sinuses/Orbits:  Unremarkable. Other: None. IMPRESSION: No acute intracranial abnormality or mass. Electronically Signed   By: Emmit Alexanders M.D.   On: 12/26/2022 18:17   CT HEAD WO CONTRAST  Result Date: 12/26/2022 CLINICAL DATA:  Transient ischemic attack (TIA) EXAM: CT HEAD WITHOUT CONTRAST TECHNIQUE: Contiguous axial images were obtained from the base of the skull through the vertex without intravenous contrast. RADIATION DOSE REDUCTION: This exam was performed according to the departmental dose-optimization program which includes automated exposure control, adjustment of the mA and/or kV according to patient size and/or use of iterative reconstruction technique. COMPARISON:  Head CT 07/08/2022 FINDINGS: Brain: No intracranial hemorrhage, mass effect, or midline shift. Stable degree of atrophy. No hydrocephalus. The basilar cisterns are patent. Mild periventricular chronic small vessel ischemic change, stable. No evidence of territorial infarct or acute ischemia. No extra-axial or intracranial fluid collection. Vascular: Atherosclerosis of skullbase vasculature without hyperdense vessel or abnormal calcification. Skull: No fracture or focal lesion. Sinuses/Orbits: No acute finding. Other: None IMPRESSION: 1. No acute intracranial abnormality. 2. Stable atrophy and chronic small vessel ischemic change. Electronically Signed   By: Keith Rake M.D.   On: 12/26/2022 16:13    Procedures Procedures    Medications Ordered in ED Medications  cefTRIAXone (ROCEPHIN) 1 g in sodium chloride 0.9 % 100 mL IVPB (0 g Intravenous Stopped 12/26/22 2103)    ED Course/ Medical Decision Making/ A&P                             Medical Decision Making Patient here from home for evaluation of brief episode of altered mental status.  Patient's family who is at bedside states that she had a 2-minute episode of "staring off and not responding" patient does not recall event.  History of seizures during adolescence but no seizure  activity since.  This episode was witnessed and family member denies any shaking or jerking activity.  Patient denies any symptoms at present.  The family member does state that she had a similar episode sometime ago with a urinary tract infection.  Differential would include but not limited to  TIA, stroke, intracranial hemorrhage, sepsis, infection  Amount and/or Complexity of Data Reviewed Labs: ordered.    Details: Labs no evidence of leukocytosis, hemoglobin unremarkable.  Chemistries show serum creatinine slightly elevated at 1.63 this is near baseline from 2 months ago.  Urinalysis shows greater than 500 glucose, 21-50 white cells, many bacteria and trace leukocytes, urine culture pending Radiology: ordered.    Details: CT head ordered for further evaluation, no acute intracranial process.  MRI brain ordered for further evaluation for possible TIA and also shows no acute intracranial abnormality or mass ECG/medicine tests: ordered.    Details: EKG shows sinus rhythm, nonspecific T wave abnormality in the anterior leads and prolonged QT Discussion of management or test interpretation with external provider(s): On recheck, patient resting comfortably.  Continues to be asymptomatic.  Vital signs reassuring.  No concern for sepsis on workup today.  Urinalysis does show likely infection, urine culture pending.  Patient given IV Rocephin here, I feel she is appropriate for discharge home and prescription written for cephalexin.  She will follow-up closely as outpatient with PCP for recheck.  Risk Prescription drug management.           Final Clinical Impression(s) / ED Diagnoses Final diagnoses:  Acute cystitis without hematuria    Rx / DC Orders ED Discharge Orders          Ordered    cephALEXin (KEFLEX) 500 MG capsule  4 times daily        12/26/22 2007              Kem Parkinson, [place] 12/28/22 1554    Godfrey Pick, MD 01/02/23 [phone]

## 2022-12-29 ENCOUNTER — Telehealth (HOSPITAL_BASED_OUTPATIENT_CLINIC_OR_DEPARTMENT_OTHER): Payer: Self-pay | Admitting: *Deleted

## 2022-12-29 NOTE — Telephone Encounter (Signed)
Post ED Visit - Positive Culture Follow-up  Culture report reviewed by antimicrobial stewardship pharmacist: [place] [facility] Team []  Elenor Quinones, Pharm.D. []  Heide Guile, Pharm.D., BCPS AQ-ID []  Parks Neptune, Pharm.D., BCPS []  Alycia Rossetti, Pharm.D., BCPS []  [place], Pharm.D., BCPS, AAHIVP []  Legrand Como, Pharm.D., BCPS, AAHIVP []  Salome Arnt, PharmD, BCPS []  Johnnette Gourd, PharmD, BCPS []  Hughes Better, PharmD, BCPS []  Leeroy Cha, PharmD []  Laqueta Linden, PharmD, BCPS []  Albertina Parr, PharmD  [place] Team []  Leodis Sias, PharmD []  Lindell Spar, PharmD []  Royetta Asal, PharmD []  Graylin Shiver, Rph []  Rema Fendt) Glennon Mac, PharmD []  Arlyn Dunning, PharmD []  Netta Cedars, PharmD []  Dia Sitter, PharmD []  Leone Haven, PharmD []  Gretta Arab, PharmD []  Theodis Shove, PharmD []  Peggyann Juba, PharmD []  Reuel Boom, PharmD   Positive urine culture Treated with Cephalexin, organism sensitive to the same and no further patient follow-up is required at this time.  Dewayne Hatch, PharmD  Harlon Flor Talley 12/29/2022, 10:28 AM

## 2023-02-01 ENCOUNTER — Encounter: Payer: Self-pay | Admitting: *Deleted

## 2023-05-15 ENCOUNTER — Other Ambulatory Visit (HOSPITAL_COMMUNITY): Payer: Self-pay | Admitting: Internal Medicine

## 2023-05-15 DIAGNOSIS — Z1231 Encounter for screening mammogram for malignant neoplasm of breast: Secondary | ICD-10-CM

## 2023-05-16 DIAGNOSIS — G4700 Insomnia, unspecified: Secondary | ICD-10-CM | POA: Insufficient documentation

## 2023-05-21 ENCOUNTER — Ambulatory Visit: Payer: 59 | Admitting: Orthopedic Surgery

## 2023-05-23 ENCOUNTER — Encounter (HOSPITAL_COMMUNITY): Payer: Self-pay

## 2023-05-23 ENCOUNTER — Ambulatory Visit (HOSPITAL_COMMUNITY)
Admission: RE | Admit: 2023-05-23 | Discharge: 2023-05-23 | Disposition: A | Discharge status: Home / Self Care | Payer: 59 | Source: Ambulatory Visit | Attending: Internal Medicine | Admitting: Internal Medicine

## 2023-05-23 DIAGNOSIS — Z1231 Encounter for screening mammogram for malignant neoplasm of breast: Secondary | ICD-10-CM | POA: Insufficient documentation

## 2023-06-08 ENCOUNTER — Encounter: Payer: Self-pay | Admitting: Orthopedic Surgery

## 2023-06-08 ENCOUNTER — Ambulatory Visit (INDEPENDENT_AMBULATORY_CARE_PROVIDER_SITE_OTHER): Payer: 59 | Admitting: Orthopedic Surgery

## 2023-06-08 VITALS — BP 148/67 | HR 92 | Ht 67.0 in | Wt 180.0 lb

## 2023-06-08 DIAGNOSIS — M65341 Trigger finger, right ring finger: Secondary | ICD-10-CM

## 2023-06-08 DIAGNOSIS — M65352 Trigger finger, left little finger: Secondary | ICD-10-CM

## 2023-06-08 MED ORDER — METHYLPREDNISOLONE ACETATE 40 MG/ML IJ SUSP
40.0000 mg | Freq: Once | INTRAMUSCULAR | Status: AC
  Administered 2023-06-08: 40 mg via INTRA_ARTICULAR

## 2023-06-08 NOTE — Progress Notes (Signed)
   VISIT TYPE: FOLLOW UP   Chief Complaint  Patient presents with   Hand Pain    Bil hand pain with recurrent triggering of the right ring finger and left small finger    Encounter Diagnoses  Name Primary?   Trigger finger, right ring finger Yes   Trigger little finger of left hand     Assessment and Plan: 77 year old female recurrent triggering of the digits of the right and left hand  Recommend injections as needed follow-up   Prior treatment:    Injx LSF  HPI: Recurrent trigger fingers right ring finger left small finger history of prior trigger finger releases and other digits    BP (!) 148/67   Pulse 92   Ht 5\' 7"  (1.702 m)   Wt 180 lb (81.6 kg)   BMI 28.19 kg/m   Ortho Exam  Tenderness over the right ring finger at the A1 pulley and also the left small finger at the A1 pulley with clicking and nodularity in the flexor tendons Imaging no imaging  A/P Encounter Diagnoses  Name Primary?   Trigger finger, right ring finger Yes   Trigger little finger of left hand     Meds ordered this encounter  Medications   methylPREDNISolone acetate (DEPO-MEDROL) injection 40 mg   methylPREDNISolone acetate (DEPO-MEDROL) injection 40 mg   Trigger finger injection  Diagnosis   Encounter Diagnoses  Name Primary?   Trigger finger, right ring finger Yes   Trigger little finger of left hand     Procedure injection A1 pulley Medications lidocaine 1% 1 mL and Depo-Medrol 40 mg 1 mL Skin prep alcohol and ethyl chloride Verbal consent was obtained Timeout confirmed the injection site  After cleaning the skin with alcohol and anesthetizing the skin with ethyl chloride the A1 pulley was palpated and the injection was performed without complication   Trigger finger injection  Diagnosis   Encounter Diagnoses  Name Primary?   Trigger finger, right ring finger Yes   Trigger little finger of left hand     Procedure injection A1 pulley Medications lidocaine 1% 1 mL  and Depo-Medrol 40 mg 1 mL Skin prep alcohol and ethyl chloride Verbal consent was obtained Timeout confirmed the injection site  After cleaning the skin with alcohol and anesthetizing the skin with ethyl chloride the A1 pulley was palpated and the injection was performed without complication

## 2023-06-08 NOTE — Patient Instructions (Signed)
You have received an injection of steroids into the joint. 15% of patients will have increased pain within the 24 hours postinjection.   This is transient and will go away.   We recommend that you use ice packs on the injection site for 20 minutes every 2 hours and extra strength Tylenol 2 tablets every 8 as needed until the pain resolves.  If you continue to have pain after taking the Tylenol and using the ice please call the office for further instructions.  

## 2024-03-13 ENCOUNTER — Encounter (INDEPENDENT_AMBULATORY_CARE_PROVIDER_SITE_OTHER): Payer: Self-pay | Admitting: *Deleted

## 2024-04-04 ENCOUNTER — Encounter: Payer: Self-pay | Admitting: Gastroenterology

## 2024-04-13 ENCOUNTER — Other Ambulatory Visit: Payer: Self-pay

## 2024-04-13 ENCOUNTER — Encounter (HOSPITAL_COMMUNITY): Payer: Self-pay | Admitting: *Deleted

## 2024-04-13 ENCOUNTER — Emergency Department (HOSPITAL_COMMUNITY)
Admission: EM | Admit: 2024-04-13 | Discharge: 2024-04-13 | Disposition: A | Discharge status: Home / Self Care | Attending: Emergency Medicine | Admitting: Emergency Medicine

## 2024-04-13 ENCOUNTER — Emergency Department (HOSPITAL_COMMUNITY)

## 2024-04-13 DIAGNOSIS — K5732 Diverticulitis of large intestine without perforation or abscess without bleeding: Secondary | ICD-10-CM | POA: Diagnosis not present

## 2024-04-13 DIAGNOSIS — R1030 Lower abdominal pain, unspecified: Secondary | ICD-10-CM | POA: Diagnosis present

## 2024-04-13 DIAGNOSIS — K5792 Diverticulitis of intestine, part unspecified, without perforation or abscess without bleeding: Secondary | ICD-10-CM

## 2024-04-13 LAB — COMPREHENSIVE METABOLIC PANEL WITH GFR
ALT: 16 U/L (ref 0–44)
AST: 24 U/L (ref 15–41)
Albumin: 4.5 g/dL (ref 3.5–5.0)
Alkaline Phosphatase: 97 U/L (ref 38–126)
Anion gap: 11 (ref 5–15)
BUN: 20 mg/dL (ref 8–23)
CO2: 21 mmol/L — ABNORMAL LOW (ref 22–32)
Calcium: 9.7 mg/dL (ref 8.9–10.3)
Chloride: 104 mmol/L (ref 98–111)
Creatinine, Ser: 1.5 mg/dL — ABNORMAL HIGH (ref 0.44–1.00)
GFR, Estimated: 36 mL/min — ABNORMAL LOW (ref >60)
Glucose, Bld: 110 mg/dL — ABNORMAL HIGH (ref 70–99)
Potassium: 3.9 mmol/L (ref 3.5–5.1)
Sodium: 136 mmol/L (ref 135–145)
Total Bilirubin: 0.5 mg/dL (ref 0.0–1.2)
Total Protein: 8.3 g/dL — ABNORMAL HIGH (ref 6.5–8.1)

## 2024-04-13 LAB — LIPASE, BLOOD: Lipase: 33 U/L (ref 11–51)

## 2024-04-13 LAB — URINALYSIS, ROUTINE W REFLEX MICROSCOPIC
Bilirubin Urine: NEGATIVE
Glucose, UA: >=500 mg/dL — AB
Hgb urine dipstick: NEGATIVE
Ketones, ur: NEGATIVE mg/dL
Nitrite: NEGATIVE
Protein, ur: NEGATIVE mg/dL
Specific Gravity, Urine: 1.016 (ref 1.005–1.030)
pH: 6 (ref 5.0–8.0)

## 2024-04-13 LAB — CBC
HCT: 47.8 % — ABNORMAL HIGH (ref 36.0–46.0)
Hemoglobin: 16 g/dL — ABNORMAL HIGH (ref 12.0–15.0)
MCH: 25.3 pg — ABNORMAL LOW (ref 26.0–34.0)
MCHC: 33.5 g/dL (ref 30.0–36.0)
MCV: 75.5 fL — ABNORMAL LOW (ref 80.0–100.0)
Platelets: 296 K/uL (ref 150–400)
RBC: 6.33 MIL/uL — ABNORMAL HIGH (ref 3.87–5.11)
RDW: 16.5 % — ABNORMAL HIGH (ref 11.5–15.5)
WBC: 13.2 K/uL — ABNORMAL HIGH (ref 4.0–10.5)
nRBC: 0 % (ref 0.0–0.2)

## 2024-04-13 MED ORDER — SODIUM CHLORIDE 0.9 % IV SOLN
3.0000 g | Freq: Once | INTRAVENOUS | Status: AC
  Administered 2024-04-13: 3 g via INTRAVENOUS
  Filled 2024-04-13: qty 8

## 2024-04-13 MED ORDER — IOHEXOL 300 MG/ML  SOLN
80.0000 mL | Freq: Once | INTRAMUSCULAR | Status: AC | PRN
  Administered 2024-04-13: 80 mL via INTRAVENOUS

## 2024-04-13 MED ORDER — ONDANSETRON HCL 4 MG/2ML IJ SOLN
4.0000 mg | Freq: Once | INTRAMUSCULAR | Status: AC
  Administered 2024-04-13: 4 mg via INTRAVENOUS
  Filled 2024-04-13: qty 2

## 2024-04-13 MED ORDER — ONDANSETRON 4 MG PO TBDP: 4.0000 mg | ORAL_TABLET | Freq: Three times a day (TID) | ORAL | 0 refills | Status: AC | PRN

## 2024-04-13 MED ORDER — SODIUM CHLORIDE 0.9 % IV BOLUS
500.0000 mL | Freq: Once | INTRAVENOUS | Status: AC
  Administered 2024-04-13: 500 mL via INTRAVENOUS

## 2024-04-13 MED ORDER — MORPHINE SULFATE (PF) 4 MG/ML IV SOLN
2.0000 mg | Freq: Once | INTRAVENOUS | Status: AC
  Administered 2024-04-13: 2 mg via INTRAVENOUS
  Filled 2024-04-13: qty 1

## 2024-04-13 MED ORDER — ACETAMINOPHEN ER 650 MG PO TBCR: 650.0000 mg | EXTENDED_RELEASE_TABLET | Freq: Three times a day (TID) | ORAL | 0 refills | Status: AC | PRN

## 2024-04-13 MED ORDER — AMOXICILLIN-POT CLAVULANATE 875-125 MG PO TABS: 1.0000 | ORAL_TABLET | Freq: Two times a day (BID) | ORAL | 0 refills | Status: AC

## 2024-04-13 NOTE — ED Provider Notes (Signed)
 [place] EMERGENCY DEPARTMENT AT [facility] Provider Note   CSN: [id number] Arrival date & time: 04/13/24  1550     Patient presents with: Abdominal Pain   Beth Meza is a 78 y.o. female.   Patient is a 78 year old female who presents to the emergency department with a chief complaint of suprapubic abdominal pain which has been ongoing for approximate the past 2 days.  She describes it as a cramping sensation.  She denies any dysuria or hematuria.  She denies any flank pain.  She denies any fever, chills, chest pain or shortness of breath.  There has been no associated nausea, vomiting, diarrhea, constipation.   Abdominal Pain      Prior to Admission medications   Medication Sig Start Date End Date Taking? Authorizing Provider  acetaminophen  (TYLENOL ) 650 MG CR tablet Take 650 mg by mouth every 8 (eight) hours as needed for pain.    [provider]  amLODipine  (NORVASC ) 10 MG tablet Take 10 mg by mouth daily. 06/03/22   [provider]  cephALEXin  (KEFLEX ) 500 MG capsule Take 1 capsule (500 mg total) by mouth 4 (four) times daily. 12/26/22   Triplett, Tammy, PA-C  donepezil  (ARICEPT ) 5 MG tablet Take 1 tablet by mouth daily. 07/10/19   [provider]  famotidine  (PEPCID ) 20 MG tablet Take 20 mg by mouth 2 (two) times daily. 01/31/19   [provider]  FARXIGA 10 MG TABS tablet Take 10 mg by mouth daily. 08/30/20   [provider]  Ferrous Gluconate  (IRON  27 PO) Take 27 mg by mouth daily.    [provider]  ferrous sulfate 325 (65 FE) MG tablet Take 325 mg by mouth daily with breakfast. 09/20/17   [provider]  ibuprofen  (ADVIL ) 200 MG tablet Take 200 mg by mouth every 6 (six) hours as needed for headache or mild pain.    [provider]  meclizine  (ANTIVERT ) 25 MG tablet Take 1 tablet (25 mg total) by mouth 3 (three) times daily as needed for dizziness. 10/24/22   Melvenia Motto, MD  Multiple  Vitamins-Minerals (PRESERVISION AREDS 2) CAPS Take 1 capsule by mouth daily.    [provider]  Multiple Vitamins-Minerals (SENIOR MULTIVITAMIN PLUS PO) Take 1 tablet by mouth daily.    [provider]  Omega-3 Fatty Acids (FISH OIL) 1000 MG CAPS Take 1,000 mg by mouth daily.     [provider]  pravastatin  (PRAVACHOL ) 40 MG tablet Take 40 mg by mouth daily. 02/11/14   [provider]  venlafaxine  XR (EFFEXOR -XR) 37.5 MG 24 hr capsule Take 2 capsules (75 mg total) by mouth daily with breakfast. 12/05/21   Gayland Lauraine PARAS, NP  vitamin C (ASCORBIC ACID) 500 MG tablet Take 500 mg by mouth daily.    [provider]    Allergies: Oxycodone     Review of Systems  Gastrointestinal:  Positive for abdominal pain.  All other systems reviewed and are negative.   Updated Vital Signs BP (!) 141/70 (BP Location: Right Arm)   Pulse 90   Temp 99.3 F (37.4 C) (Oral)   Resp 16   Ht 5' 6 (1.676 m)   Wt 83.9 kg   SpO2 97%   BMI 29.86 kg/m   Physical Exam Vitals and nursing note reviewed.  Constitutional:      Appearance: Normal appearance.  HENT:     Head: Normocephalic and atraumatic.     Nose: Nose normal.  Mouth/Throat:     Mouth: Mucous membranes are moist.  Eyes:     Extraocular Movements: Extraocular movements intact.     Conjunctiva/sclera: Conjunctivae normal.     Pupils: Pupils are equal, round, and reactive to light.  Cardiovascular:     Rate and Rhythm: Normal rate and regular rhythm.     Pulses: Normal pulses.     Heart sounds: Normal heart sounds. No murmur heard.    No gallop.  Pulmonary:     Effort: Pulmonary effort is normal. No respiratory distress.     Breath sounds: Normal breath sounds. No stridor. No wheezing, rhonchi or rales.  Abdominal:     General: Abdomen is flat. Bowel sounds are normal. There is no distension. There are no signs of injury.     Palpations: Abdomen is soft. There is no mass.     Tenderness: There  is abdominal tenderness in the suprapubic area. Negative signs include Murphy's sign and McBurney's sign.     Hernia: No hernia is present.  Musculoskeletal:        General: Normal range of motion.     Cervical back: Normal range of motion and neck supple.  Skin:    General: Skin is warm and dry.  Neurological:     General: No focal deficit present.     Mental Status: She is alert and oriented to person, place, and time. Mental status is at baseline.  Psychiatric:        Mood and Affect: Mood normal.        Behavior: Behavior normal.        Thought Content: Thought content normal.        Judgment: Judgment normal.     (all labs ordered are listed, but only abnormal results are displayed) Labs Reviewed  COMPREHENSIVE METABOLIC PANEL WITH GFR - Abnormal; Notable for the following components:      Result Value   CO2 21 (*)    Glucose, Bld 110 (*)    Creatinine, Ser 1.50 (*)    Total Protein 8.3 (*)    GFR, Estimated 36 (*)    All other components within normal limits  CBC - Abnormal; Notable for the following components:   WBC 13.2 (*)    RBC 6.33 (*)    Hemoglobin 16.0 (*)    HCT 47.8 (*)    MCV 75.5 (*)    MCH 25.3 (*)    RDW 16.5 (*)    All other components within normal limits  LIPASE, BLOOD  URINALYSIS, ROUTINE W REFLEX MICROSCOPIC    EKG: None  Radiology: No results found.   Procedures   Medications Ordered in the ED  morphine  (PF) 4 MG/ML injection 2 mg (2 mg Intravenous Given 04/13/24 1624)  ondansetron  (ZOFRAN ) injection 4 mg (4 mg Intravenous Given 04/13/24 1623)  sodium chloride  0.9 % bolus 500 mL (500 mLs Intravenous New Bag/Given 04/13/24 1623)    Clinical Course as of 04/13/24 1841  Sun Apr 13, 2024  1813 CT ABDOMEN PELVIS W CONTRAST [CR]    Clinical Course User Index [CR] Beth Lonni BIRCH, PA-C                                 Medical Decision Making Amount and/or Complexity of Data Reviewed Labs: ordered. Radiology: ordered.  Decision-making details documented in ED Course.  Risk OTC drugs. Prescription drug management.   This patient presents to the  ED for concern of abdominal pain differential diagnosis includes acute appendicitis, cholecystitis, obstruction, diverticulitis, ovarian torsion or cyst, pyelonephritis, kidney stone, pancreatitis, mesenteric ischemia    Additional history obtained:  Additional history obtained from family External records from outside source obtained and reviewed including medical records   Lab Tests:  I Ordered, and personally interpreted labs.  The pertinent results include: Mild leukocytosis, no anemia, elevated creatinine but at baseline, normal electrolytes, normal kidney function, unremarkable urinalysis   Imaging Studies ordered:  I ordered imaging studies including CT scan of the abdomen and pelvis I independently visualized and interpreted imaging which showed acute uncomplicated diverticulitis I agree with the radiologist interpretation   Medicines ordered and prescription drug management:  I ordered medication including Unasyn , morphine , Zofran , IV fluids for acute diverticulitis Reevaluation of the patient after these medicines showed that the patient improved I have reviewed the patients home medicines and have made adjustments as needed   Problem List / ED Course:  Patient is doing very well at this time and is stable for discharge home.  Discussed with patient and family that she does have CT scan findings consistent with acute diverticulitis that is uncomplicated at this point.  She does have associated mild leukocytosis but has no other indication for sepsis at this point.  Kidney function is at baseline.  Lipase is within normal limits as well.  No other acute surgical process was noted within CT scan of abdomen and pelvis.  Patient is otherwise well-appearing at this time and no signs of acute distress.  She is tolerating p.o. intake.  Do not suspect  that admission is warranted at this time and will continue treatment of her diverticulitis with oral antibiotics on an outpatient basis.  Patient notes that she is already scheduled for a colonoscopy in 2 weeks.  She was directed to reach out to her gastroenterologist to make them aware that she has diverticulitis before the colonoscopy.  Strict return precautions were discussed for any new or worsening symptoms.  Patient and family voiced understanding and had no additional questions.   Social Determinants of Health:  None        Final diagnoses:  None    ED Discharge Orders     None          Beth Meza 04/13/24 1843    Beth Elsie CROME, MD 04/13/24 [phone]

## 2024-04-13 NOTE — ED Notes (Signed)
 Pt taken to bathroom in wc

## 2024-04-13 NOTE — Discharge Instructions (Addendum)
 Please take all antibiotics as directed.  Please follow-up closely with your primary care doctor and gastroenterologist on an outpatient basis.  Please reach out to your gastroenterologist to ensure that they are aware of your diverticulitis diagnosis before your colonoscopy.  Return to the emergency department immediately for any new or worsening symptoms to include fever, worsening abdominal pain, vomiting or unable to move your bowels.

## 2024-04-13 NOTE — ED Triage Notes (Signed)
 Pt abd cramping x 2 days. Denies N/V/D, denies any pain with urination.

## 2024-04-15 ENCOUNTER — Other Ambulatory Visit (HOSPITAL_COMMUNITY): Payer: Self-pay | Admitting: Internal Medicine

## 2024-04-15 DIAGNOSIS — Z1231 Encounter for screening mammogram for malignant neoplasm of breast: Secondary | ICD-10-CM

## 2024-04-21 NOTE — Progress Notes (Unsigned)
 GI Office Note    Referring Provider: Renato Dorothey HERO, NP Primary Care Physician:  Renato Dorothey HERO, NP  Primary Gastroenterologist:  Chief Complaint   No chief complaint on file.    History of Present Illness   Beth Meza is a 78 y.o. female presenting today at the request of Dorothey Renato, NP for screening colonoscopy.   Since appt made, she ended up in the ED 04/13/2024. Suspected diverticulitis. Started on augmentin , 10 day course.    CT A/P with contras 04/2024: IMPRESSION: 1. Colonic diverticulosis with acute uncomplicated mid sigmoid diverticulitis. Recommend colonoscopy status post treatment and status post complete resolution of inflammatory changes to exclude an underlying lesion. 2. Uterine fibroids. 3.  Aortic Atherosclerosis (ICD10-I70.0).  Colonoscopy 04/2014: -normal mucosa in TI -left colon extremely redundant -small internal hemorrhoids -next colonoscopy in 10 years  Medications   Current Outpatient Medications  Medication Sig Dispense Refill   acetaminophen  (TYLENOL ) 650 MG CR tablet Take 1 tablet (650 mg total) by mouth every 8 (eight) hours as needed for pain. 30 tablet 0   amLODipine  (NORVASC ) 10 MG tablet Take 10 mg by mouth daily.     amoxicillin -clavulanate (AUGMENTIN ) 875-125 MG tablet Take 1 tablet by mouth every 12 (twelve) hours for 10 days. 20 tablet 0   cephALEXin  (KEFLEX ) 500 MG capsule Take 1 capsule (500 mg total) by mouth 4 (four) times daily. 28 capsule 0   donepezil  (ARICEPT ) 5 MG tablet Take 1 tablet by mouth daily.     famotidine  (PEPCID ) 20 MG tablet Take 20 mg by mouth 2 (two) times daily.     FARXIGA 10 MG TABS tablet Take 10 mg by mouth daily.     Ferrous Gluconate  (IRON  27 PO) Take 27 mg by mouth daily.     ferrous sulfate 325 (65 FE) MG tablet Take 325 mg by mouth daily with breakfast.     ibuprofen  (ADVIL ) 200 MG tablet Take 200 mg by mouth every 6 (six) hours as needed for headache or mild pain.      meclizine  (ANTIVERT ) 25 MG tablet Take 1 tablet (25 mg total) by mouth 3 (three) times daily as needed for dizziness. 20 tablet 0   Multiple Vitamins-Minerals (PRESERVISION AREDS 2) CAPS Take 1 capsule by mouth daily.     Multiple Vitamins-Minerals (SENIOR MULTIVITAMIN PLUS PO) Take 1 tablet by mouth daily.     Omega-3 Fatty Acids (FISH OIL) 1000 MG CAPS Take 1,000 mg by mouth daily.      ondansetron  (ZOFRAN -ODT) 4 MG disintegrating tablet Take 1 tablet (4 mg total) by mouth every 8 (eight) hours as needed for nausea or vomiting. 20 tablet 0   pravastatin  (PRAVACHOL ) 40 MG tablet Take 40 mg by mouth daily.     venlafaxine  XR (EFFEXOR -XR) 37.5 MG 24 hr capsule Take 2 capsules (75 mg total) by mouth daily with breakfast. 60 capsule 0   vitamin C (ASCORBIC ACID) 500 MG tablet Take 500 mg by mouth daily.     No current facility-administered medications for this visit.    Allergies   Allergies as of 04/22/2024 - Review Complete 04/13/2024  Allergen Reaction Noted   Oxycodone  Other (See Comments) 10/24/2022    Past Medical History   Past Medical History:  Diagnosis Date   Anemia    Back spasm    Degenerative arthritis    Diabetes (HCC)    diet controlled   Dizziness and giddiness 03/07/2013   Dyslipidemia    Gastroesophageal  reflux disease    Hand cramps    Headache syndrome 11/07/2018   Headache(784.0) 03/07/2013   History of carpal tunnel syndrome    Bilateral   HOH (hard of hearing)    Hypertension    Leg cramps    Memory deficit 02/25/2014   Seizures (HCC)    as child- unknown etiology-no meds    Past Surgical History   Past Surgical History:  Procedure Laterality Date   ANKLE SURGERY Left    History of ankle fracture   BACK SURGERY     COLONOSCOPY N/A 05/04/2014   Procedure: COLONOSCOPY;  Surgeon: Margo LITTIE Haddock, MD;  Location: [facility];  Service: Endoscopy;  Laterality: N/A;  11:30   HAND SURGERY     Carpal tunnel syndrome, trigger fingers bilaterally   KNEE  SURGERY Left    Pin, history of fracture   LUMBAR LAMINECTOMY/DECOMPRESSION MICRODISCECTOMY N/A 10/29/2017   Procedure: LUMBAR LAMINECTOMY AND FORAMINOTOMY LUMBAR THREE- LUMBAR FOUR, LUMBAR FOUR- LUMBAR FIVE;  Surgeon: Mavis Purchase, MD;  Location: [facility] OR;  Service: Neurosurgery;  Laterality: N/A;   plate in let leg     SHOULDER ACROMIOPLASTY Left 06/10/2014   Procedure: SHOULDER ACROMIOPLASTY;  Surgeon: Taft FORBES Minerva, MD;  Location: [facility];  Service: Orthopedics;  Laterality: Left;   SHOULDER OPEN ROTATOR CUFF REPAIR Left 04/08/2014   Dr. Minerva   SHOULDER OPEN ROTATOR CUFF REPAIR Left 06/10/2014   Procedure: REVISION ROTATOR CUFF REPAIR SHOULDER OPEN WITH GRAFT;  Surgeon: Taft FORBES Minerva, MD;  Location: [facility];  Service: Orthopedics;  Laterality: Left;   SHOULDER OPEN ROTATOR CUFF REPAIR Right 10/29/2018   Procedure: OPEN ROTATOR CUFF REPAIR WITH ACHROMIOPLASTY;  Surgeon: Minerva Taft FORBES, MD;  Location: [facility];  Service: Orthopedics;  Laterality: Right;   TUBAL LIGATION      Past Family History   Family History  Problem Relation Age of Onset   Heart attack Father    Colon cancer Neg Hx     Past Social History   Social History   Socioeconomic History   Marital status: Divorced    Spouse name: Not on file   Number of children: 3   Years of education: 14   Highest education level: Not on file  Occupational History   Occupation: Retired    Comment: Agricultural consultant  Tobacco Use   Smoking status: Never   Smokeless tobacco: Never  Vaping Use   Vaping status: Never Used  Substance and Sexual Activity   Alcohol use: No   Drug use: No   Sexual activity: Yes    Birth control/protection: Post-menopausal  Other Topics Concern   Not on file  Social History Narrative   Patient is right handed.   Patient does not drink caffeine.   Social Drivers of Corporate investment banker Strain: Not on file  Food Insecurity: No Food Insecurity (07/08/2022)   Hunger Vital Sign     Worried About Running Out of Food in the Last Year: Never true    Ran Out of Food in the Last Year: Never true  Transportation Needs: No Transportation Needs (07/08/2022)   PRAPARE - Administrator, Civil Service (Medical): No    Lack of Transportation (Non-Medical): No  Physical Activity: Not on file  Stress: Not on file  Social Connections: Not on file  Intimate Partner Violence: Not At Risk (07/08/2022)   Humiliation, Afraid, Rape, and Kick questionnaire    Fear of Current or Ex-Partner: No  Emotionally Abused: No    Physically Abused: No    Sexually Abused: No    Review of Systems   General: Negative for anorexia, weight loss, fever, chills, fatigue, weakness. Eyes: Negative for vision changes.  ENT: Negative for hoarseness, difficulty swallowing , nasal congestion. CV: Negative for chest pain, angina, palpitations, dyspnea on exertion, peripheral edema.  Respiratory: Negative for dyspnea at rest, dyspnea on exertion, cough, sputum, wheezing.  GI: See history of present illness. GU:  Negative for dysuria, hematuria, urinary incontinence, urinary frequency, nocturnal urination.  MS: Negative for joint pain, low back pain.  Derm: Negative for rash or itching.  Neuro: Negative for weakness, abnormal sensation, seizure, frequent headaches, memory loss,  confusion.  Psych: Negative for anxiety, depression, suicidal ideation, hallucinations.  Endo: Negative for unusual weight change.  Heme: Negative for bruising or bleeding. Allergy: Negative for rash or hives.  Physical Exam   There were no vitals taken for this visit.   General: Well-nourished, well-developed in no acute distress.  Head: Normocephalic, atraumatic.   Eyes: Conjunctiva pink, no icterus. Mouth: Oropharyngeal mucosa moist and pink  Neck: Supple without thyromegaly, masses, or lymphadenopathy.  Lungs: Clear to auscultation bilaterally.  Heart: Regular rate and rhythm, no murmurs rubs or gallops.   Abdomen: Bowel sounds are normal, nontender, nondistended, no hepatosplenomegaly or masses,  no abdominal bruits or hernia, no rebound or guarding.   Rectal: not performed Extremities: No lower extremity edema. No clubbing or deformities.  Neuro: Alert and oriented x 4 , grossly normal neurologically.  Skin: Warm and dry, no rash or jaundice.   Psych: Alert and cooperative, normal mood and affect.  Labs   Lab Results  Component Value Date   NA 136 04/13/2024   CL 104 04/13/2024   K 3.9 04/13/2024   CO2 21 (L) 04/13/2024   BUN 20 04/13/2024   CREATININE 1.50 (H) 04/13/2024   GFRNONAA 36 (L) 04/13/2024   CALCIUM 9.7 04/13/2024   ALBUMIN 4.5 04/13/2024   GLUCOSE 110 (H) 04/13/2024   Lab Results  Component Value Date   ALT 16 04/13/2024   AST 24 04/13/2024   ALKPHOS 97 04/13/2024   BILITOT 0.5 04/13/2024   Lab Results  Component Value Date   WBC 13.2 (H) 04/13/2024   HGB 16.0 (H) 04/13/2024   HCT 47.8 (H) 04/13/2024   MCV 75.5 (L) 04/13/2024   PLT 296 04/13/2024   Lab Results  Component Value Date   LIPASE 33 04/13/2024    Imaging Studies   CT ABDOMEN PELVIS W CONTRAST Result Date: 04/13/2024 CLINICAL DATA:  suprapubic abd pain cramping x 2 days. Denies N/V/D, denies any pain with urination EXAM: CT ABDOMEN AND PELVIS WITH CONTRAST TECHNIQUE: Multidetector CT imaging of the abdomen and pelvis was performed using the standard protocol following bolus administration of intravenous contrast. RADIATION DOSE REDUCTION: This exam was performed according to the departmental dose-optimization program which includes automated exposure control, adjustment of the mA and/or kV according to patient size and/or use of iterative reconstruction technique. CONTRAST:  80mL OMNIPAQUE  IOHEXOL  300 MG/ML  SOLN COMPARISON:  CT lumbar spine 07/26/2021, ultrasound renal 07/05/2018 FINDINGS: Lower chest: No acute abnormality. Hepatobiliary: No focal liver abnormality. No gallstones, gallbladder wall  thickening, or pericholecystic fluid. No biliary dilatation. Pancreas: No focal lesion. Normal pancreatic contour. No surrounding inflammatory changes. No main pancreatic ductal dilatation. Spleen: Normal in size without focal abnormality. Adrenals/Urinary Tract: No adrenal nodule bilaterally. Bilateral kidneys enhance symmetrically. No hydronephrosis. No hydroureter. The urinary bladder is  decompressed. On delayed imaging, there is no urothelial wall thickening and there are no filling defects in the opacified portions of the bilateral collecting systems or ureters. Stomach/Bowel: Stomach is within normal limits. No evidence of small bowel wall thickening or dilatation. No large bowel dilatation. Mid sigmoid colon irregular bowel wall thickening along a focal diverticula and mild pericolonic fat stranding (2:78 79). Colonic diverticulosis. Appendix appears normal. Vascular/Lymphatic: No abdominal aorta or iliac aneurysm. Mild atherosclerotic plaque of the aorta and its branches. No abdominal, pelvic, or inguinal lymphadenopathy. Reproductive: Calcified intramural uterine fibroids. Otherwise uterus and bilateral adnexa are unremarkable. Other: No intraperitoneal free fluid. No intraperitoneal free gas. No organized fluid collection. Musculoskeletal: No abdominal wall hernia or abnormality. No suspicious lytic or blastic osseous lesions. No acute displaced fracture. Multilevel degenerative changes of the spine. Grade 1 anterolisthesis of L3 on L4. Mild levoscoliosis of the lumbar spine centered at the L3 level. IMPRESSION: 1. Colonic diverticulosis with acute uncomplicated mid sigmoid diverticulitis. Recommend colonoscopy status post treatment and status post complete resolution of inflammatory changes to exclude an underlying lesion. 2. Uterine fibroids. 3.  Aortic Atherosclerosis (ICD10-I70.0). Electronically Signed   By: Morgane  Naveau M.D.   On: 04/13/2024 17:05    Assessment/Plan:       PIERRETTE Sonny RAMAN. Ezzard, MHS, PA-C [facility]

## 2024-04-22 ENCOUNTER — Encounter: Payer: Self-pay | Admitting: Gastroenterology

## 2024-04-22 ENCOUNTER — Ambulatory Visit (INDEPENDENT_AMBULATORY_CARE_PROVIDER_SITE_OTHER): Admitting: Gastroenterology

## 2024-04-22 ENCOUNTER — Encounter: Payer: Self-pay | Admitting: *Deleted

## 2024-04-22 ENCOUNTER — Other Ambulatory Visit: Payer: Self-pay | Admitting: *Deleted

## 2024-04-22 VITALS — BP 127/77 | HR 73 | Temp 98.6°F | Ht 66.0 in | Wt 186.8 lb

## 2024-04-22 DIAGNOSIS — K219 Gastro-esophageal reflux disease without esophagitis: Secondary | ICD-10-CM | POA: Diagnosis not present

## 2024-04-22 DIAGNOSIS — R933 Abnormal findings on diagnostic imaging of other parts of digestive tract: Secondary | ICD-10-CM | POA: Insufficient documentation

## 2024-04-22 DIAGNOSIS — R1310 Dysphagia, unspecified: Secondary | ICD-10-CM | POA: Diagnosis not present

## 2024-04-22 DIAGNOSIS — K5792 Diverticulitis of intestine, part unspecified, without perforation or abscess without bleeding: Secondary | ICD-10-CM | POA: Insufficient documentation

## 2024-04-22 DIAGNOSIS — R1319 Other dysphagia: Secondary | ICD-10-CM

## 2024-04-22 MED ORDER — PEG 3350-KCL-NA BICARB-NACL 420 G PO SOLR: 4000.0000 mL | Freq: Once | ORAL | 0 refills | Status: AC

## 2024-04-22 NOTE — Patient Instructions (Addendum)
 Complete your antibiotics.  If your abdominal pain does not resolve, please let me know. Call [phone]. We do not want to do a colonoscopy while you are having active abdominal pain.   Until your abdominal pain resolves, eat low fiber diet. Avoid raw vegetables. Once you are feeling better, you can add back high fiber diet. You may consider eating corn sparingly since your last episode started after eating several servings of corn over short period of time.   We will plan for an upper endoscopy to evaluate your esophagus/swallowing issues. Colonoscopy to evaluate your colon. Procedures to be no sooner than week of August 18th in order to allow your colon to heal.

## 2024-05-20 ENCOUNTER — Other Ambulatory Visit: Payer: Self-pay

## 2024-05-20 ENCOUNTER — Emergency Department (HOSPITAL_COMMUNITY)
Admission: EM | Admit: 2024-05-20 | Discharge: 2024-05-20 | Disposition: A | Discharge status: Home / Self Care | Attending: Emergency Medicine | Admitting: Emergency Medicine

## 2024-05-20 ENCOUNTER — Encounter (HOSPITAL_COMMUNITY): Payer: Self-pay

## 2024-05-20 DIAGNOSIS — R42 Dizziness and giddiness: Secondary | ICD-10-CM | POA: Insufficient documentation

## 2024-05-20 DIAGNOSIS — Z79899 Other long term (current) drug therapy: Secondary | ICD-10-CM | POA: Diagnosis not present

## 2024-05-20 DIAGNOSIS — R110 Nausea: Secondary | ICD-10-CM | POA: Insufficient documentation

## 2024-05-20 DIAGNOSIS — E119 Type 2 diabetes mellitus without complications: Secondary | ICD-10-CM | POA: Insufficient documentation

## 2024-05-20 DIAGNOSIS — R519 Headache, unspecified: Secondary | ICD-10-CM | POA: Diagnosis not present

## 2024-05-20 DIAGNOSIS — I10 Essential (primary) hypertension: Secondary | ICD-10-CM | POA: Diagnosis not present

## 2024-05-20 LAB — COMPREHENSIVE METABOLIC PANEL WITH GFR
ALT: 24 U/L (ref 0–44)
AST: 30 U/L (ref 15–41)
Albumin: 4.5 g/dL (ref 3.5–5.0)
Alkaline Phosphatase: 86 U/L (ref 38–126)
Anion gap: 12 (ref 5–15)
BUN: 19 mg/dL (ref 8–23)
CO2: 24 mmol/L (ref 22–32)
Calcium: 9.8 mg/dL (ref 8.9–10.3)
Chloride: 103 mmol/L (ref 98–111)
Creatinine, Ser: 1.43 mg/dL — ABNORMAL HIGH (ref 0.44–1.00)
GFR, Estimated: 38 mL/min — ABNORMAL LOW (ref >60)
Glucose, Bld: 110 mg/dL — ABNORMAL HIGH (ref 70–99)
Potassium: 4.2 mmol/L (ref 3.5–5.1)
Sodium: 139 mmol/L (ref 135–145)
Total Bilirubin: 0.7 mg/dL (ref 0.0–1.2)
Total Protein: 8.3 g/dL — ABNORMAL HIGH (ref 6.5–8.1)

## 2024-05-20 LAB — CBC WITH DIFFERENTIAL/PLATELET
Abs Immature Granulocytes: 0.02 K/uL (ref 0.00–0.07)
Basophils Absolute: 0 K/uL (ref 0.0–0.1)
Basophils Relative: 1 %
Eosinophils Absolute: 0.1 K/uL (ref 0.0–0.5)
Eosinophils Relative: 1 %
HCT: 45.5 % (ref 36.0–46.0)
Hemoglobin: 15.1 g/dL — ABNORMAL HIGH (ref 12.0–15.0)
Immature Granulocytes: 0 %
Lymphocytes Relative: 13 %
Lymphs Abs: 1.2 K/uL (ref 0.7–4.0)
MCH: 25.5 pg — ABNORMAL LOW (ref 26.0–34.0)
MCHC: 33.2 g/dL (ref 30.0–36.0)
MCV: 76.7 fL — ABNORMAL LOW (ref 80.0–100.0)
Monocytes Absolute: 0.4 K/uL (ref 0.1–1.0)
Monocytes Relative: 4 %
Neutro Abs: 7 K/uL (ref 1.7–7.7)
Neutrophils Relative %: 81 %
Platelets: 268 K/uL (ref 150–400)
RBC: 5.93 MIL/uL — ABNORMAL HIGH (ref 3.87–5.11)
RDW: 17.3 % — ABNORMAL HIGH (ref 11.5–15.5)
WBC: 8.7 K/uL (ref 4.0–10.5)
nRBC: 0 % (ref 0.0–0.2)

## 2024-05-20 MED ORDER — METOCLOPRAMIDE HCL 10 MG PO TABS
10.0000 mg | ORAL_TABLET | Freq: Once | ORAL | Status: AC
  Administered 2024-05-20 (×2): 10 mg via ORAL
  Filled 2024-05-20: qty 1

## 2024-05-20 MED ORDER — ACETAMINOPHEN 500 MG PO TABS
1000.0000 mg | ORAL_TABLET | Freq: Once | ORAL | Status: AC
  Administered 2024-05-20 (×2): 1000 mg via ORAL
  Filled 2024-05-20: qty 2

## 2024-05-20 NOTE — ED Triage Notes (Signed)
 Pt comes in for vertigo and hot flashes this morning. Pt was unable to walk due to the vertigo. Pt uses cane @ baseline. Pt states her BG was elevated 117 and BP elevated 150/70,. Pt states that is normally high for her. Pt says the hot flashes has subsided for now. Pt denies any falls or LOC. Pt is having some numbness and tingling in both legs since this started. Pt is having a h/a 10/10. Pt did not take anything for h/a

## 2024-05-20 NOTE — ED Notes (Signed)
 During orthostatic vital signs, patient noted to be wobbling in small circles with eyes closed. When eyes open, patient able to stand still.

## 2024-05-20 NOTE — Discharge Instructions (Addendum)
 Continue to follow with your neurologist regarding your headaches and vertigo. It may be reasonable to see an ear nose and throat doctor regarding your vertigo as well as this does seem more consistent with an inner ear issue.  I have given you the information for an ENT clinic in [place].  In the meantime please continue taking all your home medications  Return to the emergency room for any new or concerning symptoms such as slurred speech, confusion, any other new or concerning symptoms.  I have also printed you some information on how to do the eply [facility] for future dizzy episodes.

## 2024-05-20 NOTE — ED Provider Notes (Signed)
 [place] EMERGENCY DEPARTMENT AT [facility] Provider Note   CSN: [id number] Arrival date & time: 05/20/24  1433     Patient presents with: Dizziness and Hot Flashes   Beth Meza is a 78 y.o. female.    Dizziness   Patient is a 78 year old female with past medical history send for DM2, vertigo, seizures, headaches, HTN, HLD, memory issues  Patient presents emergency room today with complaints of episode of vertigo that occurred this morning when she woke up she states she did not wake with vertigo but it began after she sat up.  She states it lasted for a few hours.  She also has an occipital headache.  She states this is similar to previous headaches she has had.  She denies any chest pain or difficulty breathing.  She endorses some mild nausea currently but no episodes of vomiting.  Continues to have mild headache but no other significant symptoms.  She states vertigo is completely resolved.  No slurred speech or focal weakness that she has experienced.  She has no history of stroke.  She does follow with neurology for dizziness and headaches.      Prior to Admission medications   Medication Sig Start Date End Date Taking? Authorizing Provider  acetaminophen  (TYLENOL ) 650 MG CR tablet Take 1 tablet (650 mg total) by mouth every 8 (eight) hours as needed for pain. 04/13/24   Daralene Lonni BIRCH, PA-C  amLODipine  (NORVASC ) 10 MG tablet Take 10 mg by mouth daily. 06/03/22   [provider]  donepezil  (ARICEPT ) 5 MG tablet Take 1 tablet by mouth daily. 07/10/19   [provider]  famotidine  (PEPCID ) 20 MG tablet Take 20 mg by mouth 2 (two) times daily. 01/31/19   [provider]  FARXIGA 10 MG TABS tablet Take 10 mg by mouth daily. 08/30/20   [provider]  Ferrous Gluconate  (IRON  27 PO) Take 27 mg by mouth daily.    [provider]  ferrous sulfate 325 (65 FE) MG tablet Take 325 mg by mouth daily with breakfast. 09/20/17    [provider]  ibuprofen  (ADVIL ) 200 MG tablet Take 200 mg by mouth every 6 (six) hours as needed for headache or mild pain.    [provider]  meclizine  (ANTIVERT ) 25 MG tablet Take 1 tablet (25 mg total) by mouth 3 (three) times daily as needed for dizziness. 10/24/22   Melvenia Motto, MD  Multiple Vitamins-Minerals (PRESERVISION AREDS 2) CAPS Take 1 capsule by mouth daily.    [provider]  Multiple Vitamins-Minerals (SENIOR MULTIVITAMIN PLUS PO) Take 1 tablet by mouth daily.    [provider]  Omega-3 Fatty Acids (FISH OIL) 1000 MG CAPS Take 1,000 mg by mouth daily.     [provider]  ondansetron  (ZOFRAN -ODT) 4 MG disintegrating tablet Take 1 tablet (4 mg total) by mouth every 8 (eight) hours as needed for nausea or vomiting. 04/13/24   Daralene Lonni BIRCH, PA-C  pravastatin  (PRAVACHOL ) 40 MG tablet Take 40 mg by mouth daily. 02/11/14   [provider]  venlafaxine  XR (EFFEXOR -XR) 37.5 MG 24 hr capsule Take 2 capsules (75 mg total) by mouth daily with breakfast. 12/05/21   Gayland Lauraine PARAS, NP  vitamin C (ASCORBIC ACID) 500 MG tablet Take 500 mg by mouth daily.    [provider]    Allergies: Oxycodone     Review of Systems  Neurological:  Positive for dizziness.    Updated Vital Signs BP ROLLEN)  146/74 (BP Location: Right Arm)   Pulse 70   Temp 97.6 F (36.4 C) (Oral)   Resp 18   Ht 5' 6 (1.676 m)   Wt 83.9 kg   SpO2 100%   BMI 29.86 kg/m   Physical Exam Vitals and nursing note reviewed.  Constitutional:      General: She is not in acute distress. HENT:     Head: Normocephalic and atraumatic.     Nose: Nose normal.  Eyes:     General: No scleral icterus. Cardiovascular:     Rate and Rhythm: Normal rate and regular rhythm.     Pulses: Normal pulses.     Heart sounds: Normal heart sounds.  Pulmonary:     Effort: Pulmonary effort is normal. No respiratory distress.     Breath sounds: No wheezing.  Abdominal:      Palpations: Abdomen is soft.     Tenderness: There is no abdominal tenderness.  Musculoskeletal:     Cervical back: Normal range of motion.     Right lower leg: No edema.     Left lower leg: No edema.  Skin:    General: Skin is warm and dry.     Capillary Refill: Capillary refill takes less than 2 seconds.  Neurological:     Mental Status: She is alert. Mental status is at baseline.     Comments: Alert and oriented x 3, smile symmetric, strength in all 4 extremities normal, no drift in either lower or upper extremities.  Sensation normal in all 4 extremities.  Alert and oriented to self, place, time and event.   Speech is fluent, clear without dysarthria or dysphasia.   Strength 5/5 in upper/lower extremities  --patient does have very slightly less strength with flexion of right wrist however she states this is at baseline for her given carpal tunnel surgery in the past. Sensation intact in upper/lower extremities   CN I not tested  CN II grossly intact visual fields bilaterally. Did not visualize posterior eye.  CN III, IV, VI PERRLA and EOMs intact bilaterally  CN V Intact sensation to sharp and light touch to the face  CN VII facial movements symmetric  CN VIII not tested  CN IX, X no uvula deviation, symmetric rise of soft palate  CN XI 5/5 SCM and trapezius strength bilaterally  CN XII Midline tongue protrusion, symmetric L/R movements     Psychiatric:        Mood and Affect: Mood normal.        Behavior: Behavior normal.     (all labs ordered are listed, but only abnormal results are displayed) Labs Reviewed  CBC WITH DIFFERENTIAL/PLATELET  COMPREHENSIVE METABOLIC PANEL WITH GFR    EKG: None  Radiology: No results found.   Procedures   Medications Ordered in the ED  metoCLOPramide  (REGLAN ) tablet 10 mg (has no administration in time range)  acetaminophen  (TYLENOL ) tablet 1,000 mg (has no administration in time range)                                     Medical Decision Making Amount and/or Complexity of Data Reviewed Labs: ordered.  Risk OTC drugs. Prescription drug management.   This patient presents to the ED for concern of vertigo, this involves a number of treatment options, and is a complaint that carries with it a moderate risk of complications and morbidity. A differential diagnosis was  considered for the patient's symptoms which is discussed below:   The differential diagnosis for vertigo includes but is not limited to:        Co morbidities: Discussed in HPI   Brief History:  Patient is a 78 year old female with past medical history send for DM2, vertigo, seizures, headaches, HTN, HLD, memory issues  Patient presents emergency room today with complaints of episode of vertigo that occurred this morning when she woke up she states she did not wake with vertigo but it began after she sat up.  She states it lasted for a few hours.  She also has an occipital headache.  She states this is similar to previous headaches she has had.  She denies any chest pain or difficulty breathing.  She endorses some mild nausea currently but no episodes of vomiting.  Continues to have mild headache but no other significant symptoms.  She states vertigo is completely resolved.  No slurred speech or focal weakness that she has experienced.  She has no history of stroke.  She does follow with neurology for dizziness and headaches.     EMR reviewed including pt PMHx, past surgical history and past visits to ER.   See HPI for more details   Lab Tests:   I personally reviewed all laboratory work and imaging. Metabolic panel without any acute abnormality specifically kidney function within normal limits and no significant electrolyte abnormalities. CBC without leukocytosis or significant anemia.   Imaging Studies:      Cardiac Monitoring:  The patient was maintained on a cardiac monitor.  I personally viewed and interpreted the  cardiac monitored which showed an underlying rhythm of: NSR EKG non-ischemic   Medicines ordered:  I ordered medication including Tylenol , Reglan  for headache Reevaluation of the patient after these medicines showed that the patient improved I have reviewed the patients home medicines and have made adjustments as needed   Critical Interventions:  .   Consults/Attending Physician   I discussed this case with my attending physician who cosigned this note including patient's presenting symptoms, physical exam, and planned diagnostics and interventions. Attending physician stated agreement with plan or made changes to plan which were implemented.   Reevaluation:  After the interventions noted above I re-evaluated patient and found that they have :improved   Social Determinants of Health:      Problem List / ED Course:  Headache and episode of vertigo consistent with prior episodes of vertigo the patient had in the past.  Patient follows with neurology.  Recommend close follow-up with neurology and provide information for ENT to follow-up as well.   Dispostion:  After consideration of the diagnostic results and the patients response to treatment, I feel that the patent would benefit from discharge home.   Final diagnoses:  Vertigo  Acute nonintractable headache, unspecified headache type    ED Discharge Orders     None          Neldon Hamp RAMAN, [place] 05/21/24 1405    Cleotilde Rogue, MD 05/22/24 1301

## 2024-05-21 LAB — CBG MONITORING, ED: Glucose-Capillary: 108 mg/dL — ABNORMAL HIGH (ref 70–99)

## 2024-05-26 ENCOUNTER — Ambulatory Visit (HOSPITAL_COMMUNITY)
Admission: RE | Admit: 2024-05-26 | Discharge: 2024-05-26 | Disposition: A | Discharge status: Home / Self Care | Source: Ambulatory Visit | Attending: Internal Medicine | Admitting: Internal Medicine

## 2024-05-26 DIAGNOSIS — Z1231 Encounter for screening mammogram for malignant neoplasm of breast: Secondary | ICD-10-CM | POA: Diagnosis present

## 2024-06-03 ENCOUNTER — Encounter (HOSPITAL_COMMUNITY)
Admission: RE | Disposition: A | Discharge status: Home / Self Care | Payer: Self-pay | Source: Home / Self Care | Attending: Internal Medicine

## 2024-06-03 ENCOUNTER — Ambulatory Visit (HOSPITAL_COMMUNITY)
Admission: RE | Admit: 2024-06-03 | Discharge: 2024-06-03 | Disposition: A | Discharge status: Home / Self Care | Attending: Internal Medicine | Admitting: Internal Medicine

## 2024-06-03 ENCOUNTER — Ambulatory Visit (HOSPITAL_COMMUNITY): Admitting: Anesthesiology

## 2024-06-03 ENCOUNTER — Encounter (HOSPITAL_COMMUNITY): Payer: Self-pay | Admitting: Internal Medicine

## 2024-06-03 ENCOUNTER — Ambulatory Visit (HOSPITAL_BASED_OUTPATIENT_CLINIC_OR_DEPARTMENT_OTHER): Admitting: Anesthesiology

## 2024-06-03 ENCOUNTER — Other Ambulatory Visit: Payer: Self-pay

## 2024-06-03 DIAGNOSIS — K5730 Diverticulosis of large intestine without perforation or abscess without bleeding: Secondary | ICD-10-CM | POA: Insufficient documentation

## 2024-06-03 DIAGNOSIS — I10 Essential (primary) hypertension: Secondary | ICD-10-CM | POA: Diagnosis not present

## 2024-06-03 DIAGNOSIS — D120 Benign neoplasm of cecum: Secondary | ICD-10-CM | POA: Insufficient documentation

## 2024-06-03 DIAGNOSIS — K219 Gastro-esophageal reflux disease without esophagitis: Secondary | ICD-10-CM

## 2024-06-03 DIAGNOSIS — R1310 Dysphagia, unspecified: Secondary | ICD-10-CM | POA: Insufficient documentation

## 2024-06-03 DIAGNOSIS — K648 Other hemorrhoids: Secondary | ICD-10-CM | POA: Diagnosis not present

## 2024-06-03 DIAGNOSIS — K2970 Gastritis, unspecified, without bleeding: Secondary | ICD-10-CM | POA: Insufficient documentation

## 2024-06-03 DIAGNOSIS — E119 Type 2 diabetes mellitus without complications: Secondary | ICD-10-CM | POA: Insufficient documentation

## 2024-06-03 DIAGNOSIS — K2100 Gastro-esophageal reflux disease with esophagitis, without bleeding: Secondary | ICD-10-CM | POA: Insufficient documentation

## 2024-06-03 DIAGNOSIS — D122 Benign neoplasm of ascending colon: Secondary | ICD-10-CM | POA: Diagnosis not present

## 2024-06-03 HISTORY — PX: ESOPHAGEAL DILATION: SHX303

## 2024-06-03 HISTORY — PX: COLONOSCOPY: SHX5424

## 2024-06-03 HISTORY — PX: ESOPHAGOGASTRODUODENOSCOPY: SHX5428

## 2024-06-03 LAB — GLUCOSE, CAPILLARY
Glucose-Capillary: 87 mg/dL (ref 70–99)
Glucose-Capillary: 87 mg/dL (ref 70–99)

## 2024-06-03 SURGERY — COLONOSCOPY
Anesthesia: General

## 2024-06-03 MED ORDER — LACTATED RINGERS IV SOLN
INTRAVENOUS | Status: DC
  Administered 2024-06-03: 500 mL via INTRAVENOUS

## 2024-06-03 MED ORDER — PROPOFOL 10 MG/ML IV BOLUS
INTRAVENOUS | Status: DC | PRN
Start: 2024-06-03 — End: 2024-06-03
  Administered 2024-06-03 (×2): 50 mg via INTRAVENOUS

## 2024-06-03 MED ORDER — PROPOFOL 500 MG/50ML IV EMUL
INTRAVENOUS | Status: DC | PRN
  Administered 2024-06-03: 200 ug/kg/min via INTRAVENOUS

## 2024-06-03 MED ORDER — LIDOCAINE 2% (20 MG/ML) 5 ML SYRINGE
INTRAMUSCULAR | Status: DC | PRN
  Administered 2024-06-03: 80 mg via INTRAVENOUS

## 2024-06-03 MED ORDER — STERILE WATER FOR IRRIGATION IR SOLN
Status: DC | PRN
  Administered 2024-06-03: 60 mL

## 2024-06-03 MED ORDER — PHENYLEPHRINE 80 MCG/ML (10ML) SYRINGE FOR IV PUSH (FOR BLOOD PRESSURE SUPPORT)
PREFILLED_SYRINGE | INTRAVENOUS | Status: DC | PRN
  Administered 2024-06-03: 160 ug via INTRAVENOUS

## 2024-06-03 MED ORDER — GLYCOPYRROLATE PF 0.2 MG/ML IJ SOSY
PREFILLED_SYRINGE | INTRAMUSCULAR | Status: DC | PRN
  Administered 2024-06-03: .2 mg via INTRAVENOUS

## 2024-06-03 NOTE — Op Note (Signed)
 [facility] Patient Name: Beth Meza Procedure Date: 06/03/2024 10:20 AM MRN: [id number] Date of Birth: 1946-01-03 Attending MD: Carlin POUR. Cindie , [place], [id number] CSN: [id number] Age: 78 Admit Type: Outpatient Procedure:                Colonoscopy Indications:              Follow-up of diverticulitis Providers:                Carlin POUR. Cindie, DO, Tammy Vaught, RN, Dorcas Lenis, Technician Referring MD:             Carlin POUR. Cindie, DO Medicines:                See the Anesthesia note for documentation of the                            administered medications Complications:            No immediate complications. Estimated Blood Loss:     Estimated blood loss was minimal. Procedure:                Pre-Anesthesia Assessment:                           - The anesthesia plan was to use monitored                            anesthesia care (MAC).                           After obtaining informed consent, the colonoscope                            was passed under direct vision. Throughout the                            procedure, the patient's blood pressure, pulse, and                            oxygen saturations were monitored continuously. The                            [id number]-HQ190L ([id number]) Peds Colon was introduced                            through the anus and advanced to the the cecum,                            identified by appendiceal orifice and ileocecal                            valve. The colonoscopy was performed without                            difficulty. The patient tolerated the  procedure                            well. The quality of the bowel preparation was                            evaluated using the BBPS [facility] Bowel Preparation                            Scale) with scores of: Right Colon = 3, Transverse                            Colon = 3 and Left Colon = 3 (entire mucosa seen                            well with no  residual staining, small fragments of                            stool or opaque liquid). The total BBPS score                            equals 9. Scope In: 10:42:11 AM Scope Out: 11:02:20 AM Scope Withdrawal Time: 0 hours 12 minutes 8 seconds  Total Procedure Duration: 0 hours 20 minutes 9 seconds  Findings:      Non-bleeding internal hemorrhoids were found.      Multiple medium-mouthed and small-mouthed diverticula were found in the       sigmoid colon.      Two flat and sessile polyps were found in the ascending colon and cecum.       The polyps were 4 to 7 mm in size. These polyps were removed with a cold       snare. Resection and retrieval were complete.      The exam was otherwise without abnormality. Impression:               - Non-bleeding internal hemorrhoids.                           - Diverticulosis in the sigmoid colon.                           - Two 4 to 7 mm polyps in the ascending colon and                            in the cecum, removed with a cold snare. Resected                            and retrieved.                           - The examination was otherwise normal.                            **Diverticulitis has healed** Moderate Sedation:      Per Anesthesia Care Recommendation:           -  Patient has a contact number available for                            emergencies. The signs and symptoms of potential                            delayed complications were discussed with the                            patient. Return to normal activities tomorrow.                            Written discharge instructions were provided to the                            patient.                           - Resume previous diet.                           - Continue present medications.                           - Await pathology results.                           - No repeat colonoscopy due to age.                           - Return to GI clinic in 3 months. Procedure  Code(s):        --- Professional ---                           [id number], Colonoscopy, flexible; with removal of                            tumor(s), polyp(s), or other lesion(s) by snare                            technique Diagnosis Code(s):        --- Professional ---                           K64.8, Other hemorrhoids                           D12.2, Benign neoplasm of ascending colon                           D12.0, Benign neoplasm of cecum                           K57.32, Diverticulitis of large intestine without                            perforation or abscess without  bleeding                           K57.30, Diverticulosis of large intestine without                            perforation or abscess without bleeding CPT copyright 2022 American Medical Association. All rights reserved. The codes documented in this report are preliminary and upon coder review may  be revised to meet current compliance requirements. Carlin POUR. Cindie, DO Carlin POUR. Cindie, DO 06/03/2024 11:14:31 AM This report has been signed electronically. Number of Addenda: 0

## 2024-06-03 NOTE — Op Note (Signed)
 [facility] Patient Name: Beth Meza Procedure Date: 06/03/2024 10:21 AM MRN: [id number] Date of Birth: 1945-10-17 Attending MD: Carlin POUR. Cindie , [place], [id number] CSN: [id number] Age: 78 Admit Type: Outpatient Procedure:                Upper GI endoscopy Indications:              Dysphagia, Heartburn Providers:                Carlin POUR. Cindie, DO, Tammy Vaught, RN, Dorcas Lenis, Technician Referring MD:             Carlin POUR. Cindie, DO Medicines:                See the Anesthesia note for documentation of the                            administered medications Complications:            No immediate complications. Estimated Blood Loss:     Estimated blood loss was minimal. Procedure:                Pre-Anesthesia Assessment:                           - The anesthesia plan was to use monitored                            anesthesia care (MAC).                           After obtaining informed consent, the endoscope was                            passed under direct vision. Throughout the                            procedure, the patient's blood pressure, pulse, and                            oxygen saturations were monitored continuously. The                            [id number] ([id number]) Upper was introduced through                            the mouth, and advanced to the second part of                            duodenum. The upper GI endoscopy was accomplished                            without difficulty. The patient tolerated the                            procedure well. Scope  In: 10:32:42 AM Scope Out: 10:37:29 AM Total Procedure Duration: 0 hours 4 minutes 47 seconds  Findings:      LA Grade A (one or more mucosal breaks less than 5 mm, not extending       between tops of 2 mucosal folds) esophagitis with no bleeding was found       at the gastroesophageal junction. Biopsies were taken with a cold       forceps for histology.       Patchy mild inflammation characterized by erythema was found in the       gastric body and in the gastric antrum. Biopsies were taken with a cold       forceps for Helicobacter pylori testing.      The duodenal bulb, first portion of the duodenum and second portion of       the duodenum were normal. Impression:               - LA Grade A reflux esophagitis with no bleeding.                            Biopsied.                           - Gastritis. Biopsied.                           - Normal duodenal bulb, first portion of the                            duodenum and second portion of the duodenum. Moderate Sedation:      Per Anesthesia Care Recommendation:           - Patient has a contact number available for                            emergencies. The signs and symptoms of potential                            delayed complications were discussed with the                            patient. Return to normal activities tomorrow.                            Written discharge instructions were provided to the                            patient.                           - Resume previous diet.                           - Continue present medications.                           - Await pathology results.                           -  Return to GI clinic in 8 weeks.                           - Consider daily PPI x12 weeks                           - Consider MBSS to further evaluate oropharyngeal                            dysphagia Procedure Code(s):        --- Professional ---                           [phone], Esophagogastroduodenoscopy, flexible,                            transoral; with biopsy, single or multiple Diagnosis Code(s):        --- Professional ---                           K21.00, Gastro-esophageal reflux disease with                            esophagitis, without bleeding                           K29.70, Gastritis, unspecified, without bleeding                            R13.10, Dysphagia, unspecified                           R12, Heartburn CPT copyright 2022 American Medical Association. All rights reserved. The codes documented in this report are preliminary and upon coder review may  be revised to meet current compliance requirements. Carlin POUR. Cindie, DO Carlin POUR. Cindie, DO 06/03/2024 10:40:27 AM This report has been signed electronically. Number of Addenda: 0

## 2024-06-03 NOTE — Discharge Instructions (Addendum)
 EGD Discharge instructions Please read the instructions outlined below and refer to this sheet in the next few weeks. These discharge instructions provide you with general information on caring for yourself after you leave the hospital. Your doctor may also give you specific instructions. While your treatment has been planned according to the most current medical practices available, unavoidable complications occasionally occur. If you have any problems or questions after discharge, please call your doctor. ACTIVITY You may resume your regular activity but move at a slower pace for the next 24 hours.  Take frequent rest periods for the next 24 hours.  Walking will help expel (get rid of) the air and reduce the bloated feeling in your abdomen.  No driving for 24 hours (because of the anesthesia (medicine) used during the test).  You may shower.  Do not sign any important legal documents or operate any machinery for 24 hours (because of the anesthesia used during the test).  NUTRITION Drink plenty of fluids.  You may resume your normal diet.  Begin with a light meal and progress to your normal diet.  Avoid alcoholic beverages for 24 hours or as instructed by your caregiver.  MEDICATIONS You may resume your normal medications unless your caregiver tells you otherwise.  WHAT YOU CAN EXPECT TODAY You may experience abdominal discomfort such as a feeling of fullness or "gas" pains.  FOLLOW-UP Your doctor will discuss the results of your test with you.  SEEK IMMEDIATE MEDICAL ATTENTION IF ANY OF THE FOLLOWING OCCUR: Excessive nausea (feeling sick to your stomach) and/or vomiting.  Severe abdominal pain and distention (swelling).  Trouble swallowing.  Temperature over 101 F (37.8 C).  Rectal bleeding or vomiting of blood.    Colonoscopy Discharge Instructions  Read the instructions outlined below and refer to this sheet in the next few weeks. These discharge instructions provide you with  general information on caring for yourself after you leave the hospital. Your doctor may also give you specific instructions. While your treatment has been planned according to the most current medical practices available, unavoidable complications occasionally occur.   ACTIVITY You may resume your regular activity, but move at a slower pace for the next 24 hours.  Take frequent rest periods for the next 24 hours.  Walking will help get rid of the air and reduce the bloated feeling in your belly (abdomen).  No driving for 24 hours (because of the medicine (anesthesia) used during the test).   Do not sign any important legal documents or operate any machinery for 24 hours (because of the anesthesia used during the test).  NUTRITION Drink plenty of fluids.  You may resume your normal diet as instructed by your doctor.  Begin with a light meal and progress to your normal diet. Heavy or fried foods are harder to digest and may make you feel sick to your stomach (nauseated).  Avoid alcoholic beverages for 24 hours or as instructed.  MEDICATIONS You may resume your normal medications unless your doctor tells you otherwise.  WHAT YOU CAN EXPECT TODAY Some feelings of bloating in the abdomen.  Passage of more gas than usual.  Spotting of blood in your stool or on the toilet paper.  IF YOU HAD POLYPS REMOVED DURING THE COLONOSCOPY: No aspirin products for 7 days or as instructed.  No alcohol for 7 days or as instructed.  Eat a soft diet for the next 24 hours.  FINDING OUT THE RESULTS OF YOUR TEST Not all test results are available  during your visit. If your test results are not back during the visit, make an appointment with your caregiver to find out the results. Do not assume everything is normal if you have not heard from your caregiver or the medical facility. It is important for you to follow up on all of your test results.  SEEK IMMEDIATE MEDICAL ATTENTION IF: You have more than a spotting of  blood in your stool.  Your belly is swollen (abdominal distention).  You are nauseated or vomiting.  You have a temperature over 101.  You have abdominal pain or discomfort that is severe or gets worse throughout the day.   Your EGD revealed mild amount inflammation in your stomach.  Also mild amount of reflux esophagitis.  I took biopsies of this to rule out infection with a bacteria called H. pylori.  Small bowel was normal.  Await pathology results, my office will contact you.  Your esophagus was wide open, I did not need to stretch it today.  Continue on Pepcid .  Your colonoscopy revealed 2 polyp(s) which I removed successfully.  We will call with these results as well.  Given your age, I do not think you need further colonoscopies for polyp surveillance purposes.  You also have diverticulosis and internal hemorrhoids. I would recommend increasing fiber in your diet or adding OTC Benefiber/Metamucil. Be sure to drink at least 4 to 6 glasses of water  daily.   Follow-up with GI in 2 to 3 months.Message sent to office.   I hope you have a great rest of your week!  Carlin POUR. Cindie, D.O. Gastroenterology and Hepatology [facility]

## 2024-06-03 NOTE — H&P (Signed)
 Primary Care Physician:  Renato Dorothey HERO, NP Primary Gastroenterologist:  Dr. Cindie  Pre-Procedure History & Physical: HPI:  Beth Meza is a 78 y.o. female is here for an EGD with possible dilation due to history of dysphagia, GERD and colonoscopy for follow-up of sigmoid diverticulitis.  Past Medical History:  Diagnosis Date   Anemia    Back spasm    Degenerative arthritis    Diabetes (HCC)    diet controlled   Dizziness and giddiness 03/07/2013   Dyslipidemia    Gastroesophageal reflux disease    Hand cramps    Headache syndrome 11/07/2018   Headache(784.0) 03/07/2013   History of carpal tunnel syndrome    Bilateral   HOH (hard of hearing)    Hypertension    Leg cramps    Memory deficit 02/25/2014   Seizures (HCC)    as child- unknown etiology-no meds    Past Surgical History:  Procedure Laterality Date   ANKLE SURGERY Left    History of ankle fracture   BACK SURGERY     COLONOSCOPY N/A 05/04/2014   Procedure: COLONOSCOPY;  Surgeon: Margo LITTIE Haddock, MD;  Location: [facility];  Service: Endoscopy;  Laterality: N/A;  11:30   HAND SURGERY     Carpal tunnel syndrome, trigger fingers bilaterally   KNEE SURGERY Left    Pin, history of fracture   LUMBAR LAMINECTOMY/DECOMPRESSION MICRODISCECTOMY N/A 10/29/2017   Procedure: LUMBAR LAMINECTOMY AND FORAMINOTOMY LUMBAR THREE- LUMBAR FOUR, LUMBAR FOUR- LUMBAR FIVE;  Surgeon: Mavis Purchase, MD;  Location: [facility];  Service: Neurosurgery;  Laterality: N/A;   plate in let leg     SHOULDER ACROMIOPLASTY Left 06/10/2014   Procedure: SHOULDER ACROMIOPLASTY;  Surgeon: Taft FORBES Minerva, MD;  Location: [facility];  Service: Orthopedics;  Laterality: Left;   SHOULDER OPEN ROTATOR CUFF REPAIR Left 04/08/2014   Dr. Minerva   SHOULDER OPEN ROTATOR CUFF REPAIR Left 06/10/2014   Procedure: REVISION ROTATOR CUFF REPAIR SHOULDER OPEN WITH GRAFT;  Surgeon: Taft FORBES Minerva, MD;  Location: [facility];  Service: Orthopedics;  Laterality: Left;    SHOULDER OPEN ROTATOR CUFF REPAIR Right 10/29/2018   Procedure: OPEN ROTATOR CUFF REPAIR WITH ACHROMIOPLASTY;  Surgeon: Minerva Taft FORBES, MD;  Location: [facility];  Service: Orthopedics;  Laterality: Right;   TUBAL LIGATION      Prior to Admission medications   Medication Sig Start Date End Date Taking? Authorizing Provider  acetaminophen  (TYLENOL ) 650 MG CR tablet Take 1 tablet (650 mg total) by mouth every 8 (eight) hours as needed for pain. 04/13/24  Yes Daralene Bruckner D, PA-C  amLODipine  (NORVASC ) 10 MG tablet Take 10 mg by mouth daily. 06/03/22  Yes [provider]  meclizine  (ANTIVERT ) 25 MG tablet Take 1 tablet (25 mg total) by mouth 3 (three) times daily as needed for dizziness. 10/24/22  Yes Melvenia Motto, MD  Multiple Vitamins-Minerals (PRESERVISION AREDS 2) CAPS Take 1 capsule by mouth daily.   Yes [provider]  Multiple Vitamins-Minerals (SENIOR MULTIVITAMIN PLUS PO) Take 1 tablet by mouth daily.   Yes [provider]  Omega-3 Fatty Acids (FISH OIL) 1000 MG CAPS Take 1,000 mg by mouth daily.    Yes [provider]  ondansetron  (ZOFRAN -ODT) 4 MG disintegrating tablet Take 1 tablet (4 mg total) by mouth every 8 (eight) hours as needed for nausea or vomiting. 04/13/24  Yes Daralene Bruckner D, PA-C  pravastatin  (PRAVACHOL ) 40 MG tablet Take 40 mg by mouth daily. 02/11/14  Yes [provider]  venlafaxine  XR (EFFEXOR -XR) 37.5 MG 24 hr capsule Take 2 capsules (75 mg total) by mouth daily with breakfast. 12/05/21  Yes Gayland Lauraine PARAS, NP  vitamin C (ASCORBIC ACID) 500 MG tablet Take 500 mg by mouth daily.   Yes [provider]  donepezil  (ARICEPT ) 5 MG tablet Take 1 tablet by mouth daily. 07/10/19   [provider]  famotidine  (PEPCID ) 20 MG tablet Take 20 mg by mouth 2 (two) times daily. 01/31/19   [provider]  FARXIGA 10 MG TABS tablet Take 10 mg by mouth daily. 08/30/20   [provider]  Ferrous Gluconate   (IRON  27 PO) Take 27 mg by mouth daily.    [provider]  ferrous sulfate 325 (65 FE) MG tablet Take 325 mg by mouth daily with breakfast. 09/20/17   [provider]  ibuprofen  (ADVIL ) 200 MG tablet Take 200 mg by mouth every 6 (six) hours as needed for headache or mild pain.    [provider]    Allergies as of 04/22/2024 - Review Complete 04/22/2024  Allergen Reaction Noted   Oxycodone  Other (See Comments) 10/24/2022    Family History  Problem Relation Age of Onset   Heart attack Father    Colon cancer Neg Hx     Social History   Socioeconomic History   Marital status: Divorced    Spouse name: Not on file   Number of children: 3   Years of education: 14   Highest education level: Not on file  Occupational History   Occupation: Retired    Comment: Agricultural consultant  Tobacco Use   Smoking status: Never   Smokeless tobacco: Never  Vaping Use   Vaping status: Never Used  Substance and Sexual Activity   Alcohol use: No   Drug use: No   Sexual activity: Yes    Birth control/protection: Post-menopausal  Other Topics Concern   Not on file  Social History Narrative   Patient is right handed.   Patient does not drink caffeine.   Social Drivers of Corporate investment banker Strain: Not on file  Food Insecurity: No Food Insecurity (07/08/2022)   Hunger Vital Sign    Worried About Running Out of Food in the Last Year: Never true    Ran Out of Food in the Last Year: Never true  Transportation Needs: No Transportation Needs (07/08/2022)   PRAPARE - Administrator, Civil Service (Medical): No    Lack of Transportation (Non-Medical): No  Physical Activity: Not on file  Stress: Not on file  Social Connections: Not on file  Intimate Partner Violence: Not At Risk (07/08/2022)   Humiliation, Afraid, Rape, and Kick questionnaire    Fear of Current or Ex-Partner: No    Emotionally Abused: No    Physically Abused: No    Sexually Abused: No     Review of Systems: General: Negative for fever, chills, fatigue, weakness. Eyes: Negative for vision changes.  ENT: Negative for hoarseness, difficulty swallowing , nasal congestion. CV: Negative for chest pain, angina, palpitations, dyspnea on exertion, peripheral edema.  Respiratory: Negative for dyspnea at rest, dyspnea on exertion, cough, sputum, wheezing.  GI: See history of present illness. GU:  Negative for dysuria, hematuria, urinary incontinence, urinary frequency, nocturnal urination.  MS: Negative for joint pain, low back pain.  Derm: Negative for rash or itching.  Neuro: Negative for weakness, abnormal sensation, seizure, frequent headaches, memory loss, confusion.  Psych: Negative for anxiety, depression Endo: Negative  for unusual weight change.  Heme: Negative for bruising or bleeding. Allergy: Negative for rash or hives.  Physical Exam: Vital signs in last 24 hours: Temp:  [97.5 F (36.4 C)] 97.5 F (36.4 C) (08/26 0905) Pulse Rate:  [66] 66 (08/26 0905) Resp:  [17] 17 (08/26 0905) BP: (154)/(87) 154/87 (08/26 0905) SpO2:  [99 %] 99 % (08/26 0905) Weight:  [83.9 kg] 83.9 kg (08/26 0905)   General:   Alert,  Well-developed, well-nourished, pleasant and cooperative in NAD Head:  Normocephalic and atraumatic. Eyes:  Sclera clear, no icterus.   Conjunctiva pink. Ears:  Normal auditory acuity. Nose:  No deformity, discharge,  or lesions. Msk:  Symmetrical without gross deformities. Normal posture. Extremities:  Without clubbing or edema. Neurologic:  Alert and  oriented x4;  grossly normal neurologically. Skin:  Intact without significant lesions or rashes. Psych:  Alert and cooperative. Normal mood and affect.   Impression/Plan: Beth Meza is here for an EGD with possible dilation due to history of dysphagia, GERD and colonoscopy for follow-up of sigmoid diverticulitis.  Risks, benefits, limitations, imponderables and alternatives regarding procedure  have been reviewed with the patient. Questions have been answered. All parties agreeable.

## 2024-06-03 NOTE — Anesthesia Postprocedure Evaluation (Signed)
 Anesthesia Post Note  Patient: Beth Meza  Procedure(s) Performed: COLONOSCOPY EGD (ESOPHAGOGASTRODUODENOSCOPY) DILATION, ESOPHAGUS  Patient location during evaluation: Phase II Anesthesia Type: General Level of consciousness: awake Pain management: pain level controlled Vital Signs Assessment: post-procedure vital signs reviewed and stable Respiratory status: spontaneous breathing and respiratory function stable Cardiovascular status: blood pressure returned to baseline and stable Postop Assessment: no headache and no apparent nausea or vomiting Anesthetic complications: no Comments: Late entry   No notable events documented.   Last Vitals:  Vitals:   06/03/24 0905 06/03/24 1105  BP: (!) 154/87 139/78  Pulse: 66 73  Resp: 17 (!) 25  Temp: (!) 36.4 C 36.4 C  SpO2: 99% 99%    Last Pain:  Vitals:   06/03/24 1105  TempSrc: Oral  PainSc: 0-No pain                 Beth Meza

## 2024-06-03 NOTE — Anesthesia Preprocedure Evaluation (Signed)
 Anesthesia Evaluation  Patient identified by MRN, date of birth, ID band Patient awake    Reviewed: Allergy & Precautions, H&P , NPO status , Patient's Chart, lab work & pertinent test results, reviewed documented beta blocker date and time   Airway Mallampati: II  TM Distance: >3 FB Neck ROM: full    Dental no notable dental hx.    Pulmonary neg pulmonary ROS   Pulmonary exam normal breath sounds clear to auscultation       Cardiovascular Exercise Tolerance: Good hypertension,  Rhythm:regular Rate:Normal     Neuro/Psych  Headaches, Seizures -,  PSYCHIATRIC DISORDERS     Dementia    GI/Hepatic Neg liver ROS,GERD  ,,  Endo/Other  diabetes    Renal/GU Renal disease  negative genitourinary   Musculoskeletal   Abdominal   Peds  Hematology  (+) Blood dyscrasia, anemia   Anesthesia Other Findings   Reproductive/Obstetrics negative OB ROS                              Anesthesia Physical Anesthesia Plan  ASA: 3  Anesthesia Plan: General   Post-op Pain Management:    Induction:   PONV Risk Score and Plan: Propofol  infusion  Airway Management Planned:   Additional Equipment:   Intra-op Plan:   Post-operative Plan:   Informed Consent: I have reviewed the patients History and Physical, chart, labs and discussed the procedure including the risks, benefits and alternatives for the proposed anesthesia with the patient or authorized representative who has indicated his/her understanding and acceptance.     Dental Advisory Given  Plan Discussed with: CRNA  Anesthesia Plan Comments:         Anesthesia Quick Evaluation

## 2024-06-03 NOTE — Transfer of Care (Signed)
 Immediate Anesthesia Transfer of Care Note  Patient: Beth Meza  Procedure(s) Performed: COLONOSCOPY EGD (ESOPHAGOGASTRODUODENOSCOPY) DILATION, ESOPHAGUS  Patient Location: Endoscopy Unit  Anesthesia Type:General  Level of Consciousness: awake, alert , oriented, and patient cooperative  Airway & Oxygen Therapy: Patient Spontanous Breathing  Post-op Assessment: Report given to RN, Post -op Vital signs reviewed and stable, and Patient moving all extremities X 4  Post vital signs: Reviewed and stable  Last Vitals:  Vitals Value Taken Time  BP 139/78 06/03/24 11:05  Temp 36.4 C 06/03/24 11:05  Pulse 73 06/03/24 11:05  Resp 25 06/03/24 11:05  SpO2 99 % 06/03/24 11:05    Last Pain:  Vitals:   06/03/24 1105  TempSrc: Oral  PainSc: 0-No pain      Patients Stated Pain Goal: 7 (06/03/24 0905)  Complications: No notable events documented.

## 2024-06-04 LAB — SURGICAL PATHOLOGY

## 2024-06-05 ENCOUNTER — Encounter (HOSPITAL_COMMUNITY): Payer: Self-pay | Admitting: Internal Medicine

## 2024-06-10 ENCOUNTER — Ambulatory Visit: Payer: Self-pay | Admitting: Internal Medicine

## 2024-07-18 ENCOUNTER — Encounter: Payer: Self-pay | Admitting: Internal Medicine

## 2024-09-19 ENCOUNTER — Ambulatory Visit: Admitting: Gastroenterology

## 2024-09-19 ENCOUNTER — Encounter: Payer: Self-pay | Admitting: Gastroenterology

## 2024-09-19 VITALS — BP 137/73 | HR 68 | Temp 97.6°F | Ht 66.0 in | Wt 187.0 lb

## 2024-09-19 DIAGNOSIS — R1310 Dysphagia, unspecified: Secondary | ICD-10-CM

## 2024-09-19 DIAGNOSIS — K2100 Gastro-esophageal reflux disease with esophagitis, without bleeding: Secondary | ICD-10-CM | POA: Diagnosis not present

## 2024-09-19 DIAGNOSIS — K5900 Constipation, unspecified: Secondary | ICD-10-CM

## 2024-09-19 DIAGNOSIS — K648 Other hemorrhoids: Secondary | ICD-10-CM | POA: Diagnosis not present

## 2024-09-19 MED ORDER — POLYETHYLENE GLYCOL 3350 17 GM/SCOOP PO POWD: ORAL | 3 refills | Status: AC

## 2024-09-19 MED ORDER — PANTOPRAZOLE SODIUM 40 MG PO TBEC: 40.0000 mg | DELAYED_RELEASE_TABLET | Freq: Every day | ORAL | 1 refills | Status: AC

## 2024-09-19 NOTE — Progress Notes (Signed)
 GI Office Note    Referring Provider: Renato Dorothey HERO, NP Primary Care Physician:  Renato Dorothey HERO, NP  Primary Gastroenterologist: Carlin POUR. Cindie, DO   Chief Complaint   Chief Complaint  Patient presents with   Follow-up    Follow up from colonoscopy. She is having issues with hemorrhoids    History of Present Illness   Beth Meza is a 78 y.o. female presenting today for follow up. She has history of diverticulitis, GERD, dysphagia. Last en 04/2024.   She is doing well. No issues with swallowing. No heartburn. She never took PPI after her EGD which showed reflux esophagitis. She denies abdominal pain. She is having issues with her hemorrhoids. She is using prep H as needed. She notes burning/itching/difficulty cleaning after BMs. She has BM about every other day. Strains about 50% of the time. No bleeding noted.       Prior Data     EGD 05/2024: -LA Grade A reflux esophagitis with no bleeding, biopsy with no histopathologic changes, negative for intestinal metaplasia or dysplasia -gastritis, negative for H. pylori - Normal duodenal bulb, first portion of duodenum and second portion of duodenum -Consider daily PPI for 12 weeks -Considered modified barium swallow study to further evaluate oropharyngeal dysphagia  Colonoscopy August 2025: - Nonbleeding internal hemorrhoids - Diverticulosis in the sigmoid colon -two 4 to 7 mm polyps in the ascending colon and in the cecum, tubular adenoma without high-grade dysplasia or malignancy, sessile serrated polyp without cytologic dysplasia - No repeat colonoscopy due to age unless she develops new symptoms  Medications   Current Outpatient Medications  Medication Sig Dispense Refill   acetaminophen  (TYLENOL ) 650 MG CR tablet Take 1 tablet (650 mg total) by mouth every 8 (eight) hours as needed for pain. 30 tablet 0   amLODipine  (NORVASC ) 10 MG tablet Take 10 mg by mouth daily.     cetirizine (ZYRTEC) 10 MG tablet  1 tablet Orally Once a day; Duration: 30 day(s)     donepezil  (ARICEPT ) 5 MG tablet Take 1 tablet by mouth daily.     famotidine  (PEPCID ) 20 MG tablet Take 20 mg by mouth 2 (two) times daily.     FARXIGA 10 MG TABS tablet Take 10 mg by mouth daily.     ferrous sulfate 325 (65 FE) MG tablet Take 325 mg by mouth daily with breakfast. (Patient taking differently: Take 325 mg by mouth daily with breakfast. Every other day)     ibuprofen  (ADVIL ) 200 MG tablet Take 200 mg by mouth every 6 (six) hours as needed for headache or mild pain.     meclizine  (ANTIVERT ) 25 MG tablet Take 1 tablet (25 mg total) by mouth 3 (three) times daily as needed for dizziness. 20 tablet 0   Multiple Vitamins-Minerals (PRESERVISION AREDS 2) CAPS Take 1 capsule by mouth daily.     Multiple Vitamins-Minerals (SENIOR MULTIVITAMIN PLUS PO) Take 1 tablet by mouth daily.     Omega-3 Fatty Acids (FISH OIL) 1000 MG CAPS Take 1,000 mg by mouth daily.      ondansetron  (ZOFRAN -ODT) 4 MG disintegrating tablet Take 1 tablet (4 mg total) by mouth every 8 (eight) hours as needed for nausea or vomiting. 20 tablet 0   pravastatin  (PRAVACHOL ) 40 MG tablet Take 40 mg by mouth daily.     venlafaxine  XR (EFFEXOR -XR) 37.5 MG 24 hr capsule Take 2 capsules (75 mg total) by mouth daily with breakfast. 60 capsule 0   vitamin  C (ASCORBIC ACID) 500 MG tablet Take 500 mg by mouth daily.     Ferrous Gluconate  (IRON  27 PO) Take 27 mg by mouth daily. (Patient not taking: Reported on 09/19/2024)     No current facility-administered medications for this visit.    Allergies   Allergies as of 09/19/2024 - Review Complete 09/19/2024  Allergen Reaction Noted   Oxycodone  Other (See Comments) 10/24/2022    Review of Systems   General: Negative for anorexia, weight loss, fever, chills, fatigue, weakness. ENT: Negative for hoarseness, difficulty swallowing , nasal congestion. CV: Negative for chest pain, angina, palpitations, dyspnea on exertion,  peripheral edema.  Respiratory: Negative for dyspnea at rest, dyspnea on exertion, cough, sputum, wheezing.  GI: See history of present illness. GU:  Negative for dysuria, hematuria, urinary incontinence, urinary frequency, nocturnal urination.  Endo: Negative for unusual weight change.     Physical Exam   BP 137/73 (BP Location: Right Arm, Patient Position: Sitting, Cuff Size: Large)   Pulse 68   Temp 97.6 F (36.4 C) (Temporal)   Ht 5' 6 (1.676 m)   Wt 187 lb (84.8 kg)   BMI 30.18 kg/m    General: Well-nourished, well-developed in no acute distress.  Eyes: No icterus. Mouth: Oropharyngeal mucosa moist and pink   Abdomen: Bowel sounds are normal, nontender, nondistended, no hepatosplenomegaly or masses,  no abdominal bruits or hernia , no rebound or guarding.  Rectal: not performed Extremities: No lower extremity edema. No clubbing or deformities. Neuro: Alert and oriented x 4   Skin: Warm and dry, no jaundice.   Psych: Alert and cooperative, normal mood and affect.  Labs   Lab Results  Component Value Date   NA 139 05/20/2024   CL 103 05/20/2024   K 4.2 05/20/2024   CO2 24 05/20/2024   BUN 19 05/20/2024   CREATININE 1.43 (H) 05/20/2024   GFRNONAA 38 (L) 05/20/2024   CALCIUM 9.8 05/20/2024   ALBUMIN 4.5 05/20/2024   GLUCOSE 110 (H) 05/20/2024   Lab Results  Component Value Date   ALT 24 05/20/2024   AST 30 05/20/2024   ALKPHOS 86 05/20/2024   BILITOT 0.7 05/20/2024   Lab Results  Component Value Date   WBC 8.7 05/20/2024   HGB 15.1 (H) 05/20/2024   HCT 45.5 05/20/2024   MCV 76.7 (L) 05/20/2024   PLT 268 05/20/2024    Imaging Studies   No results found.  Assessment/Plan:   GERD/dysphagia: -reflux esophagitis noted on EGD (Grade A), consider PPI noted, but never started -clinically doing well on H2 blocker  -agreeable to PPI for 8 weeks to verify healing of esophagitis. Pantoprazole 40mg  daily before breakfast for 8 weeks. She will hold famotidine   while on PPI but resume when completed  Internal hemorrhoids: -she has some relief with PrepH but not adequate -she is interested in hemorrhoid banding -return for hemorrhoid banding in near future  Constipation: -mild -add miralax 1/2 to 1 capful daily     Sonny RAMAN. Ezzard, MHS, PA-C [facility]

## 2024-09-19 NOTE — Patient Instructions (Addendum)
 We will bring you back for hemorrhoid banding.   For your esophagitis: please stop famotidine  (Pepcid ) while you are on pantoprazole 40mg  daily before breakfast. We will have you take pantoprazole for 8 week. After you finish, restart your famotidine . RX for pantoprazole sent to your pharmacy.  For constipation: start miralax 1/2-1 capful daily mixed in 6 ounces of liquid. Miralax is over the counter but I did send RX in case your insurance pays for it.

## 2024-10-14 ENCOUNTER — Other Ambulatory Visit: Payer: Self-pay | Admitting: Gastroenterology

## 2024-11-06 ENCOUNTER — Encounter: Payer: Self-pay | Admitting: Gastroenterology

## 2024-11-06 ENCOUNTER — Ambulatory Visit: Admitting: Gastroenterology

## 2024-11-06 VITALS — BP 134/79 | HR 72 | Temp 98.6°F | Ht 66.0 in | Wt 190.2 lb

## 2024-11-06 DIAGNOSIS — K642 Third degree hemorrhoids: Secondary | ICD-10-CM | POA: Diagnosis not present

## 2024-11-06 NOTE — Progress Notes (Signed)
" ° ° °  [facility] BANDING PROCEDURE NOTE  Beth Meza is a 79 y.o. female presenting today for consideration of hemorrhoid banding. Last colonoscopy August 2025 with internal hemorrhoids, diverticulosis, polyps with no repeat colonoscopy due to age unless clinically indicated. She has rectal bleeding, fecal soiling, prolapsing, pressure.    The patient presents with symptomatic grade 3 hemorrhoids, unresponsive to maximal medical therapy, requesting rubber band ligation of her hemorrhoidal disease. All risks, benefits, and alternative forms of therapy were described and informed consent was obtained.   The decision was made to band the left lateral internal hemorrhoid, and the [facility] System was used to perform band ligation without complication. Digital anorectal examination was then performed to assure proper positioning of the band, and to adjust the banded tissue as required. The patient was discharged home without pain or other issues. Dietary and behavioral recommendations were given, along with follow-up instructions. The patient will return in several weeks for followup and possible additional banding as required.  No complications were encountered and the patient tolerated the procedure well.   [name], PhD, ANP-BC [facility]   "

## 2024-11-06 NOTE — Patient Instructions (Signed)
" °  Please avoid straining.  You should limit your toilet time to 2-3 minutes at the most.   I recommend Benefiber 2 teaspoons each morning in the beverage of your choice! Continue with the Miralax  powder daily.   Please call me with any concerns or issues!  I will see you in follow-up for additional banding in several weeks.   It was a pleasure to see you today. I want to create trusting relationships with patients and provide genuine, compassionate, and quality care. I truly value your feedback, so please be on the lookout for a survey regarding your visit with me today. I appreciate your time in completing this!         [name], PhD, ANP-BC [facility]      "

## 2024-12-17 ENCOUNTER — Ambulatory Visit: Admitting: Gastroenterology
